# Patient Record
Sex: Male | Born: 1955
Health system: Southern US, Community
[De-identification: ages and names within clinical notes are randomized; demographics above are authoritative.]

## PROBLEM LIST (undated history)

## (undated) DIAGNOSIS — J45909 Unspecified asthma, uncomplicated: Secondary | ICD-10-CM

## (undated) DIAGNOSIS — F32A Depression, unspecified: Secondary | ICD-10-CM

## (undated) DIAGNOSIS — I10 Essential (primary) hypertension: Secondary | ICD-10-CM

## (undated) DIAGNOSIS — R55 Syncope and collapse: Secondary | ICD-10-CM

## (undated) DIAGNOSIS — E785 Hyperlipidemia, unspecified: Secondary | ICD-10-CM

## (undated) HISTORY — DX: Syncope and collapse: R55

## (undated) HISTORY — DX: Unspecified asthma, uncomplicated: J45.909

## (undated) HISTORY — PX: OTHER SURGICAL HISTORY: SHX169

## (undated) HISTORY — DX: Hyperlipidemia, unspecified: E78.5

## (undated) HISTORY — PX: APPENDECTOMY: SHX54

## (undated) HISTORY — PX: NASAL SINUS SURGERY: SHX719

## (undated) HISTORY — DX: Essential (primary) hypertension: I10

## (undated) HISTORY — PX: VASECTOMY: SHX75

## (undated) HISTORY — DX: Depression, unspecified: F32.A

---

## 1999-08-10 ENCOUNTER — Emergency Department (HOSPITAL_COMMUNITY): Admission: EM | Admit: 1999-08-10 | Discharge: 1999-08-10 | Payer: Self-pay | Admitting: Emergency Medicine

## 2005-11-27 ENCOUNTER — Ambulatory Visit (HOSPITAL_COMMUNITY): Admission: RE | Admit: 2005-11-27 | Discharge: 2005-11-27 | Payer: Self-pay | Admitting: Family Medicine

## 2005-11-27 IMAGING — CR DG CHEST 2V
2 series · 2 of 2 positions shown · non-contrast
Comparison: None.

CLINICAL DATA: Nonsmoker with persistent cough for 9 months.  
 CHEST - 2 VIEW:

[view not recorded (1 of 2)]
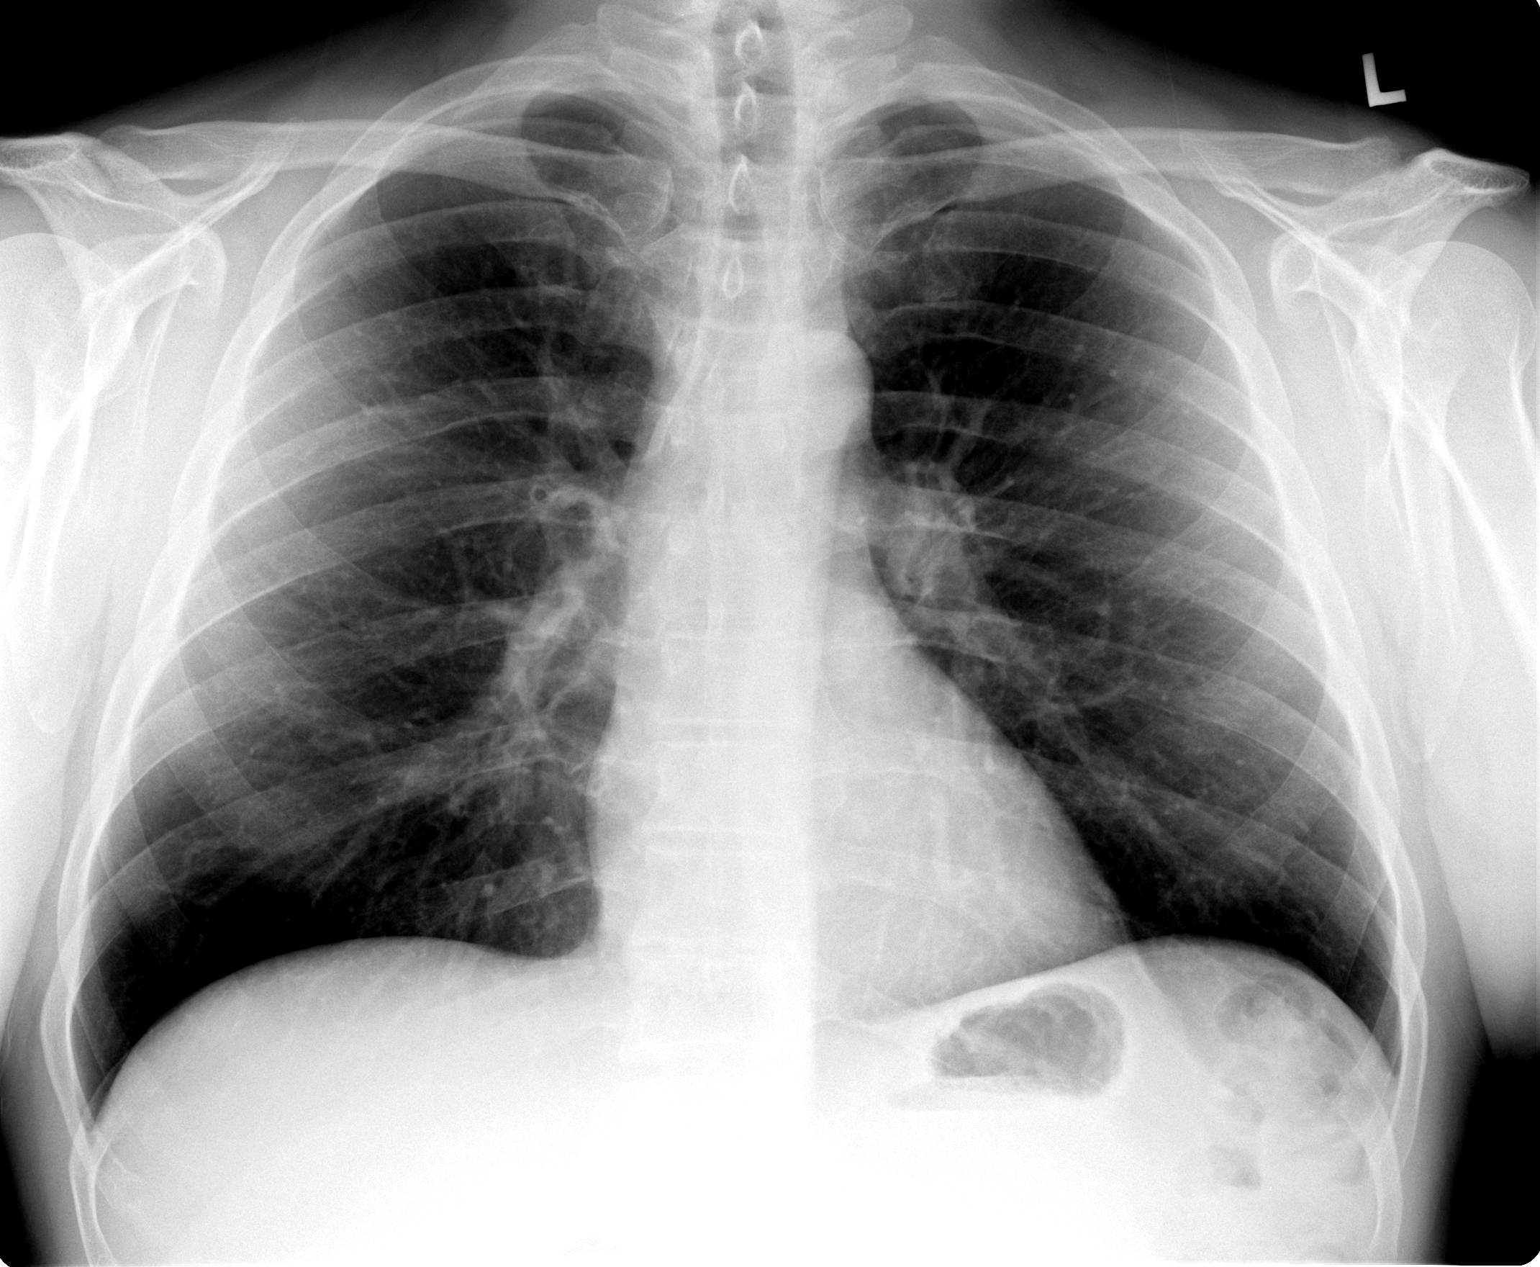

[view not recorded (2 of 2)]
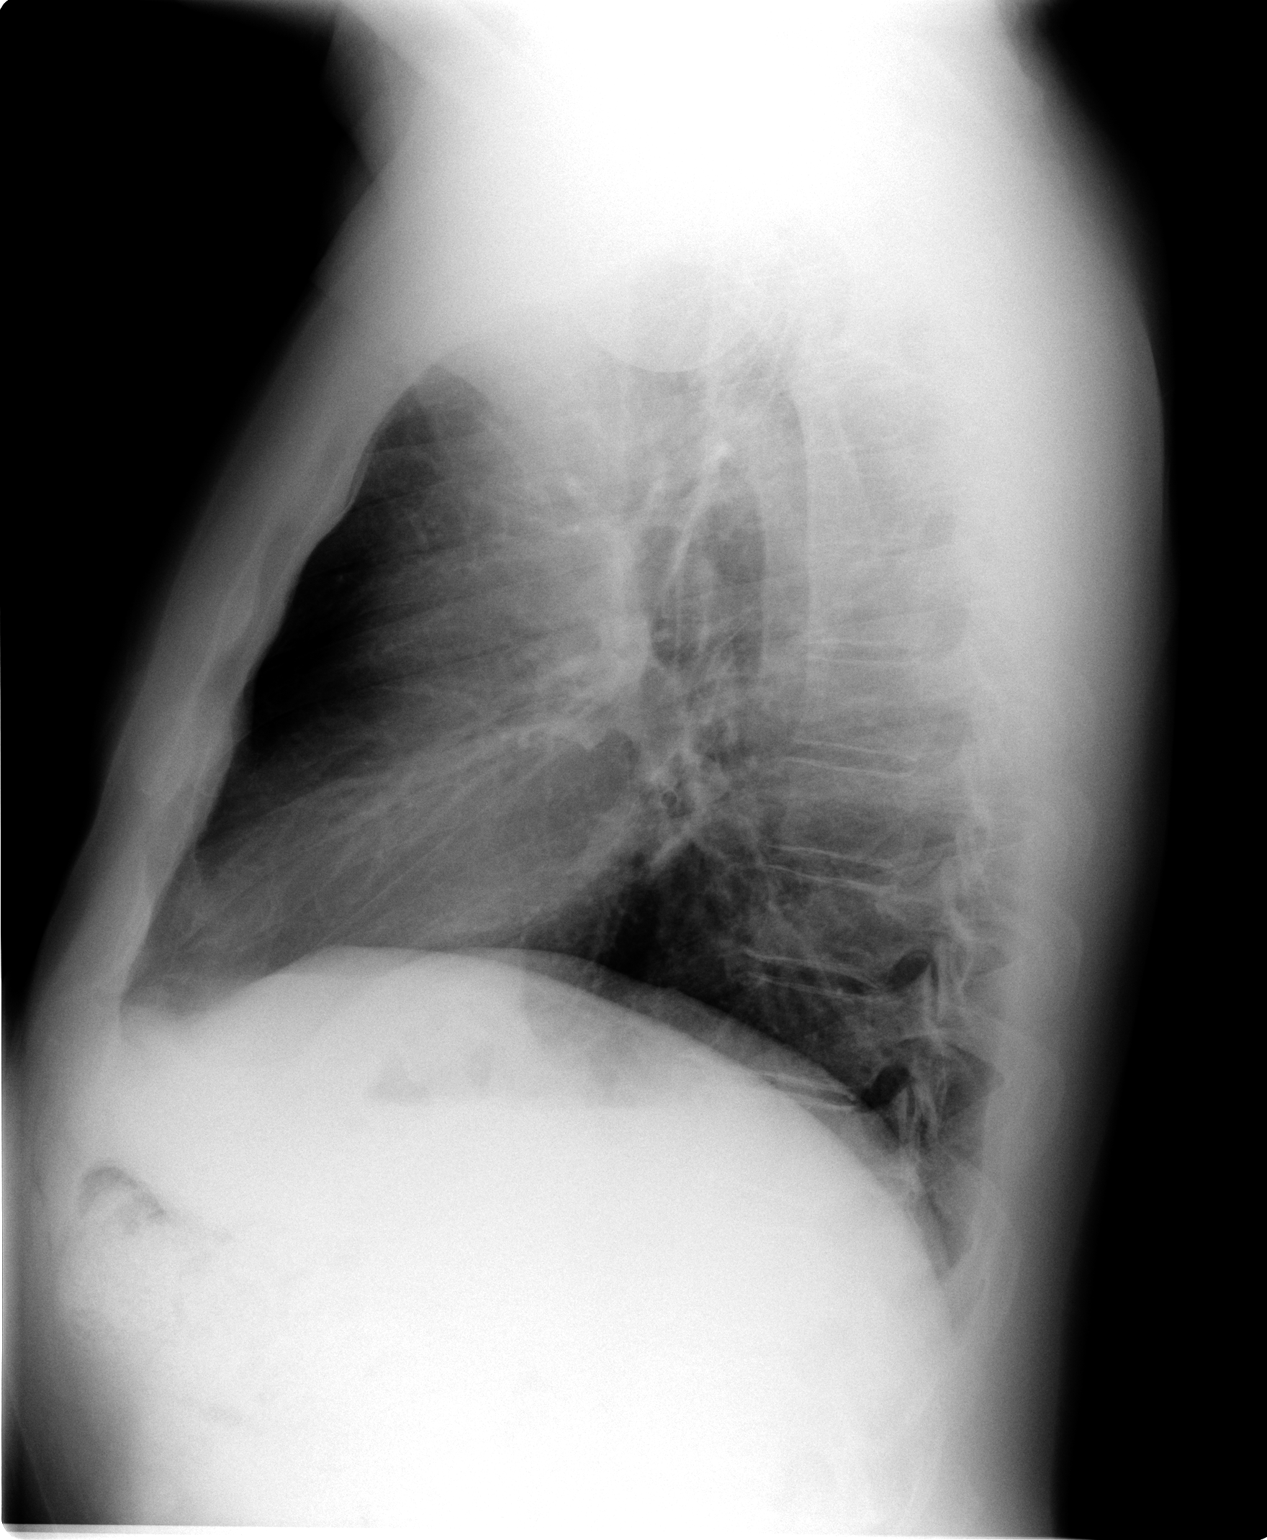

[2 of 2 positions shown; findings below may reference images not displayed]

FINDINGS: The heart size and mediastinal contours are within normal limits.  Both lungs are clear.  The visualized skeletal structures are unremarkable.
IMPRESSION: No active cardiopulmonary disease.

## 2005-11-27 IMAGING — CT CT PARANASAL SINUSES LIMITED
1 series · 15 of 28 positions shown, 19 images · non-contrast
Comparison: none

CLINICAL DATA: Nonsmoker with chronic cough for one year.  Sinus congestion and puffiness of the eyes. 
LIMITED CT OF PARANASAL SINUSES:
TECHNIQUE: Limited coronal CT images were obtained through the paranasal sinuses without intravenous contrast.

[Series 2: limited sinus prone · axial · 0.33mm/px · z∈[-9,+126]mm · 15 of 28 slices shown, 19 images]
[im 2/28  brain]
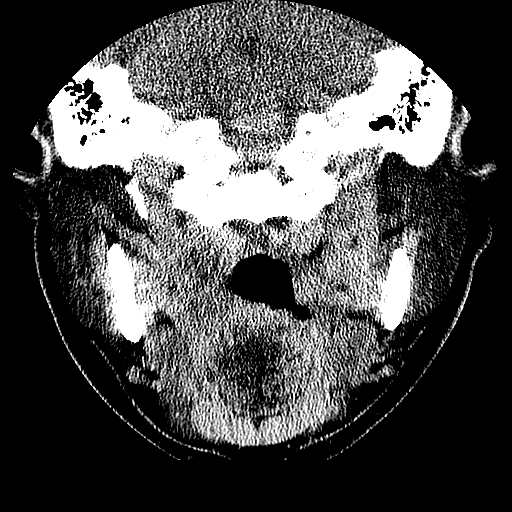
[im 2/28  bone]
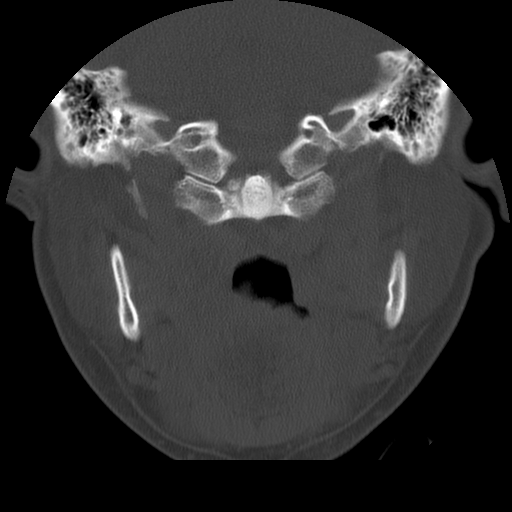
[im 4/28  bone]
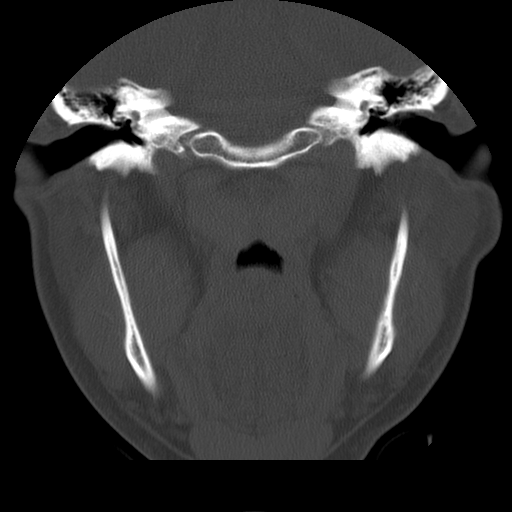
[im 6/28  bone]
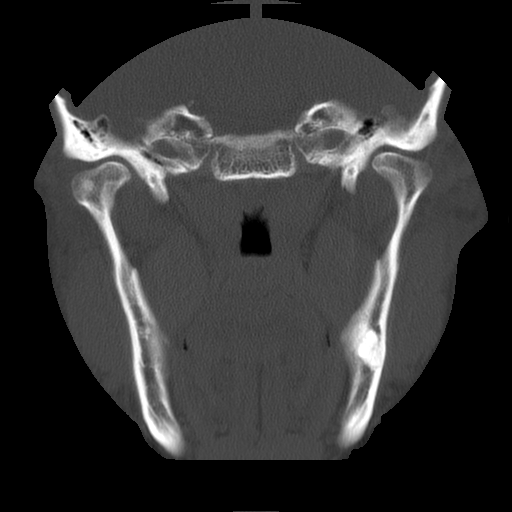
[im 8/28  bone]
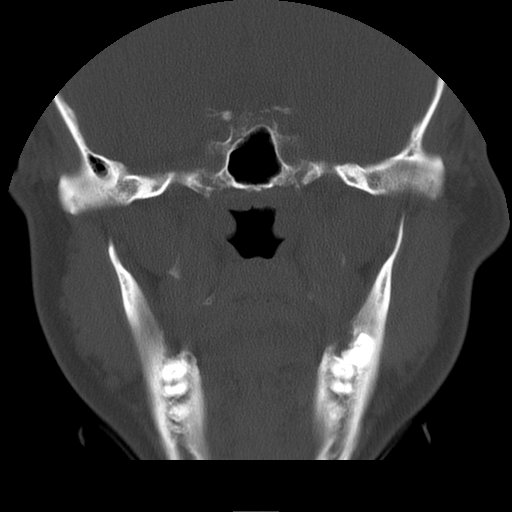
[im 9/28  brain]
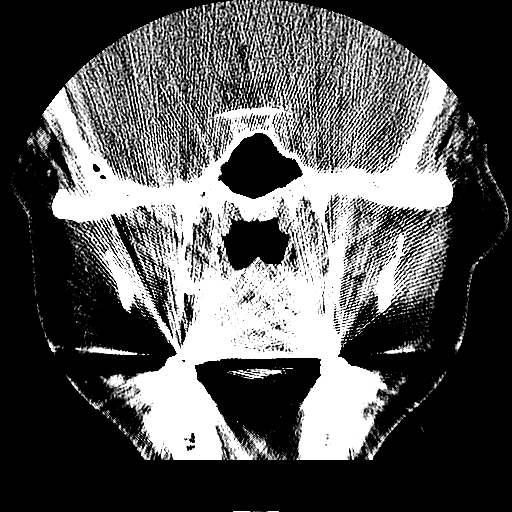
[im 9/28  bone]
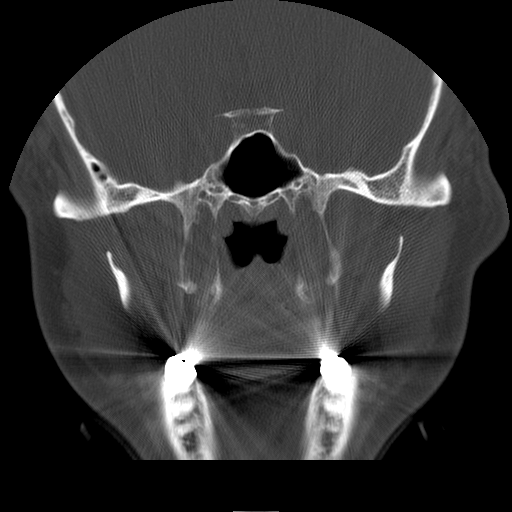
[im 11/28  bone]
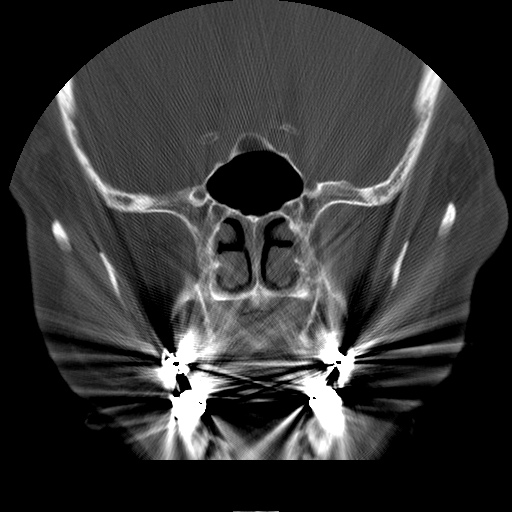
[im 13/28  bone]
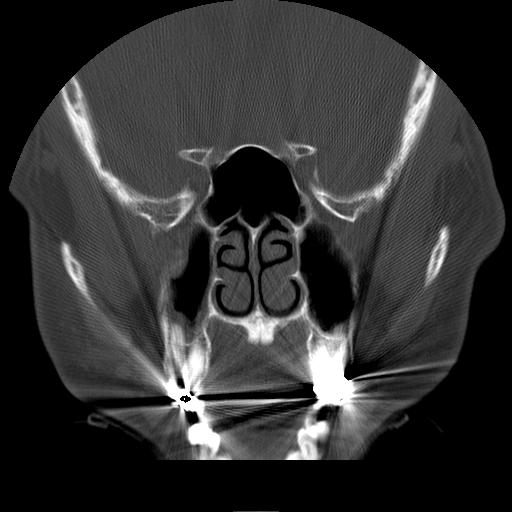
[im 15/28  bone]
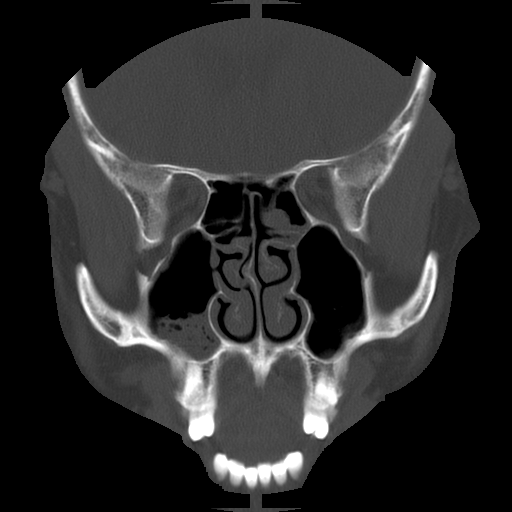
[im 16/28  brain]
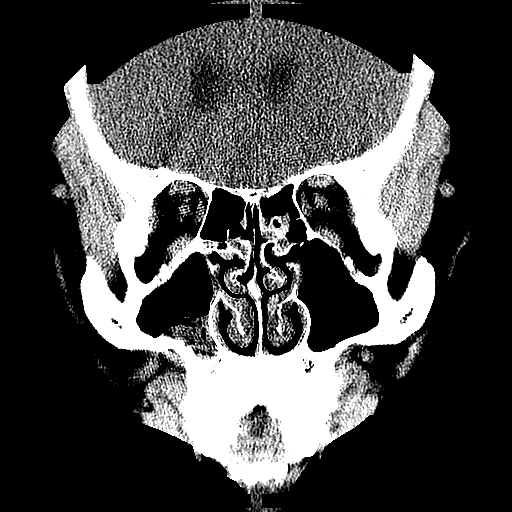
[im 16/28  bone]
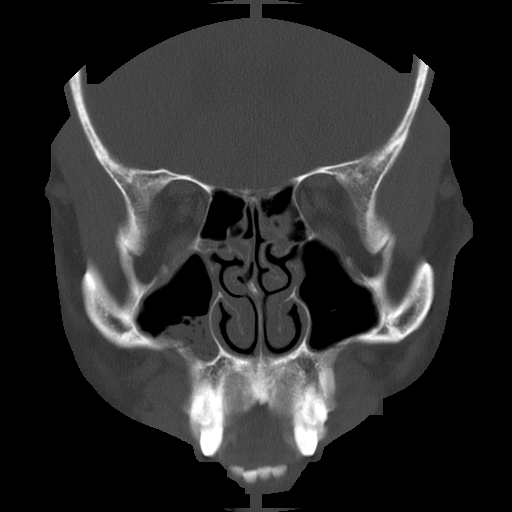
[im 18/28  bone]
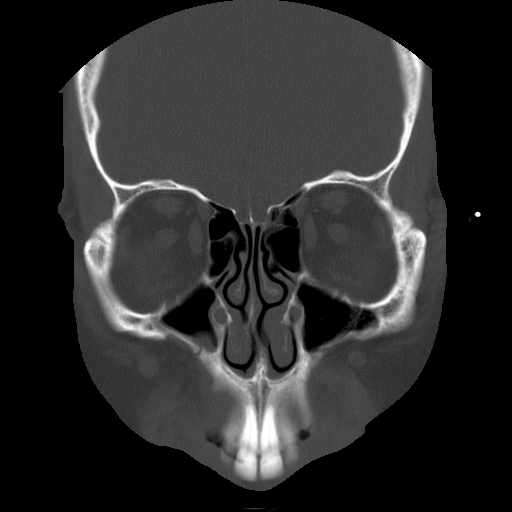
[im 20/28  bone]
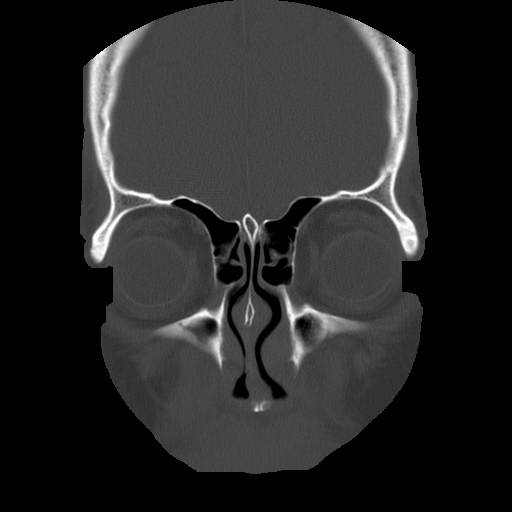
[im 21/28  bone]
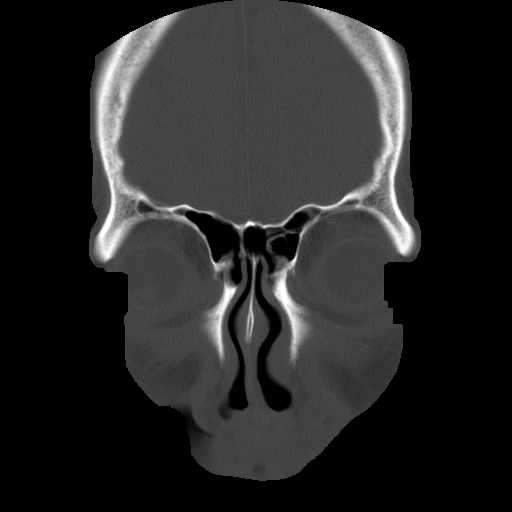
[im 23/28  brain]
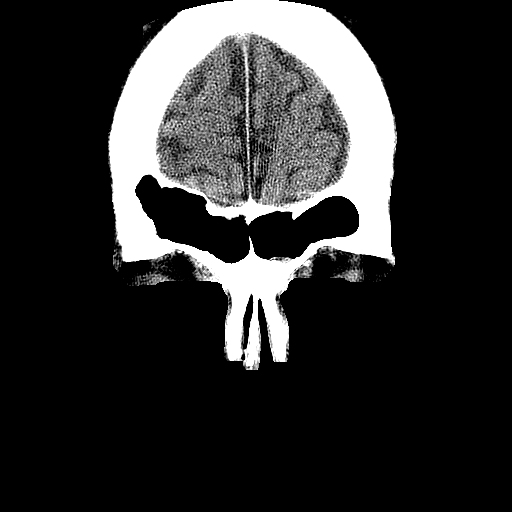
[im 23/28  bone]
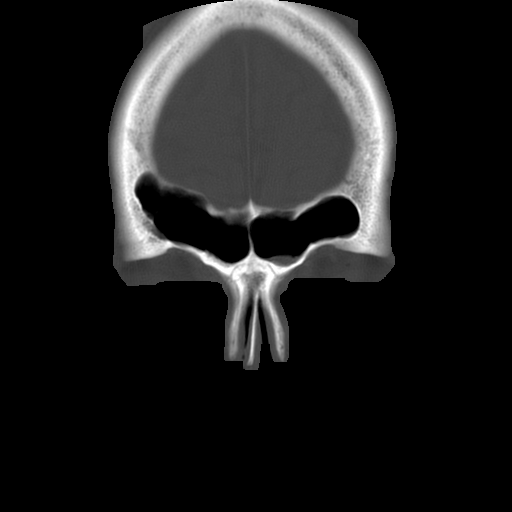
[im 25/28  bone]
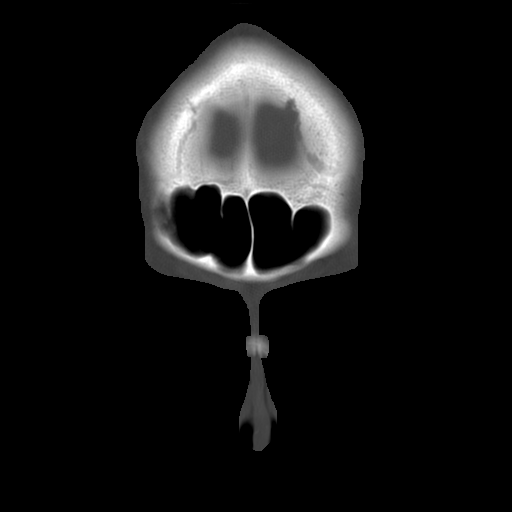
[im 27/28  bone]
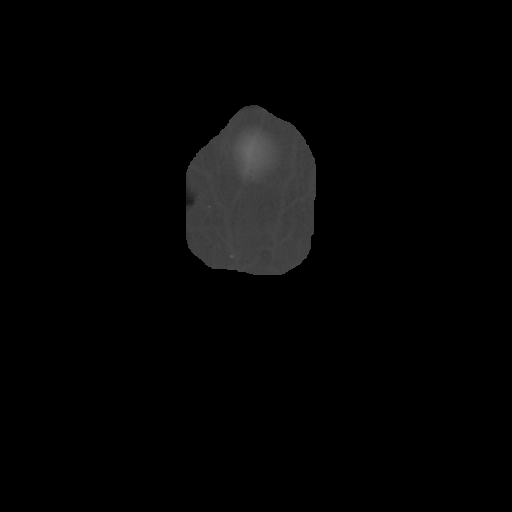

[15 of 28 positions shown; findings below may reference images not displayed]

FINDINGS: There is mild focal mucosal thickening identified in the inferior portion of the right frontal sinus.  Mucosal thickening in the base of the right maxillary sinus is also noted.  Some soft tissue is seen in the left maxillary sinus with trapped air bubbles, suggesting that this may represent thick mucous.  Also noted within the right maxillary sinus is some MATSHELO appearing soft tissue.  This does not completely fill the maxillary sinus, and there is no evidence for associated bony destruction or extra-sinus extension.  This may represent some resolving sinus infection, although the possibility of an atypical infectious process is not excluded.  Clinical correlation would be useful.  
Several of the ethmoidal air cells demonstrate fluid filling and some mucosal thickening.  Both ostial meatal complexes appear patent.  The nasal septum shows minimal left-sided deviation.  No Haller cell formation is seen.
IMPRESSION: 1. Mucosal thickening and fluid filling in several of the paranasal sinuses, as described above.  The overall appearance is compatible with some degree of chronic sinusitis.   Thick fluid is suggested within the left maxillary sinus and some fluid filling of ethmoidal air cells raises the possibility of an acute process. 
2. MATSHELO soft tissue is seen within the right maxillary sinus.  This is an unusual finding and may represent some resolving sinus disease on this side. However, the possibility of an atypical infection in this location is not excluded.  There is no associated bony destruction seen to suggest radiographic evidence for a fungal infection causing this appearance.  Clinical correlation would be recommended.

## 2007-11-11 ENCOUNTER — Ambulatory Visit (HOSPITAL_COMMUNITY): Admission: RE | Admit: 2007-11-11 | Discharge: 2007-11-11 | Payer: Self-pay | Admitting: Family Medicine

## 2007-11-11 IMAGING — CT CT PARANASAL SINUSES LIMITED
1 series · 14 of 16 positions shown, 18 images · non-contrast
Comparison: [DATE]

CLINICAL DATA: Recurrent sinus infections with drainage and
congestion.

CT PARANASAL SINUSES WITHOUT CONTRAST
TECHNIQUE: Multidetector CT through the paranasal sinuses was
performed using the standard protocol without intravenous contrast.

[Series 2: limited sinus prone · axial · 0.33mm/px · z∈[+146,+248]mm · 14 of 16 slices shown, 18 images]
[im 2/16  brain]
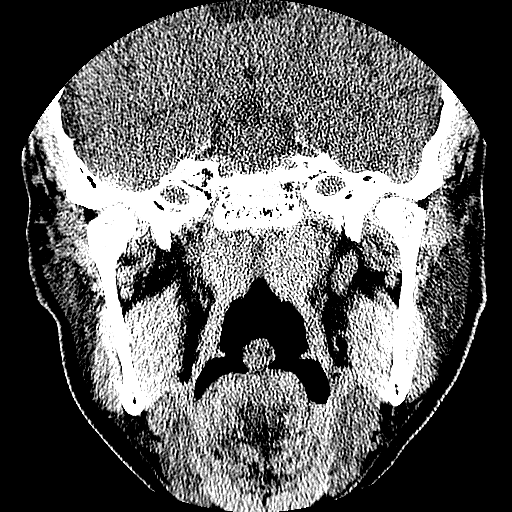
[im 2/16  bone]
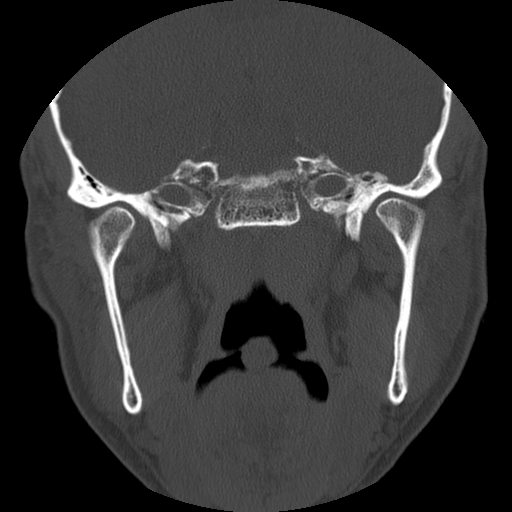
[im 3/16  bone]
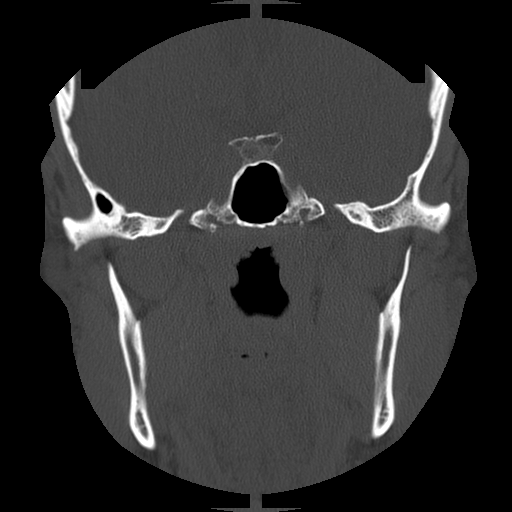
[im 4/16  bone]
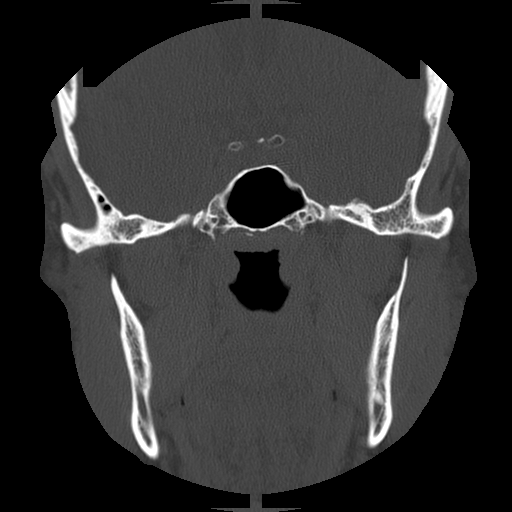
[im 5/16  bone]
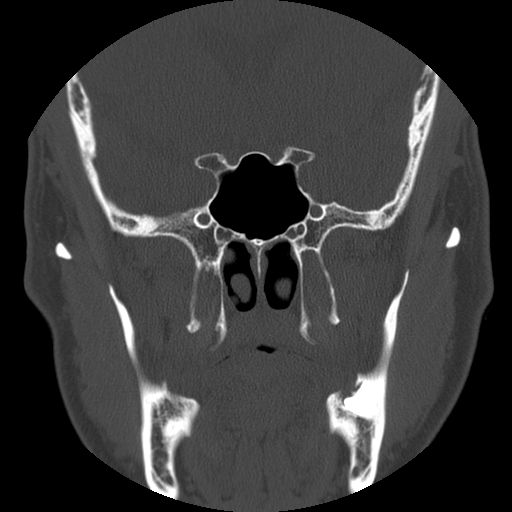
[im 6/16  brain]
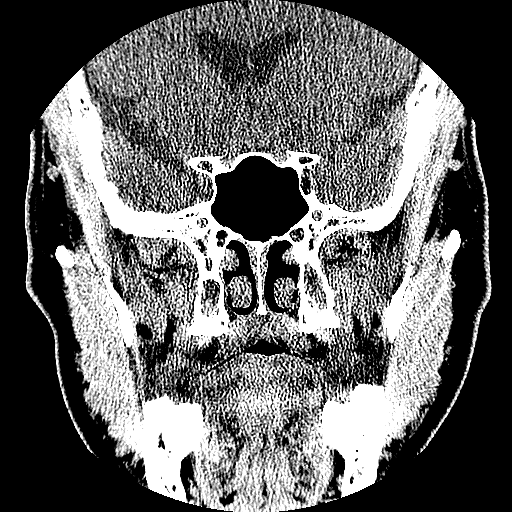
[im 6/16  bone]
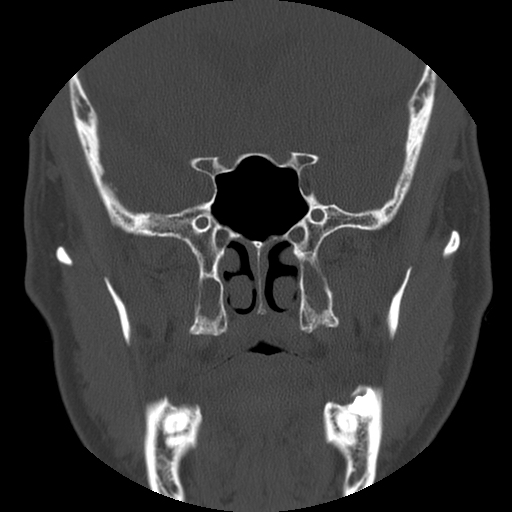
[im 7/16  bone]
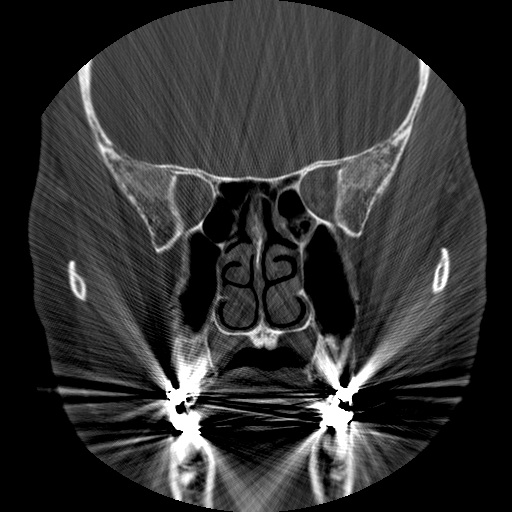
[im 8/16  bone]
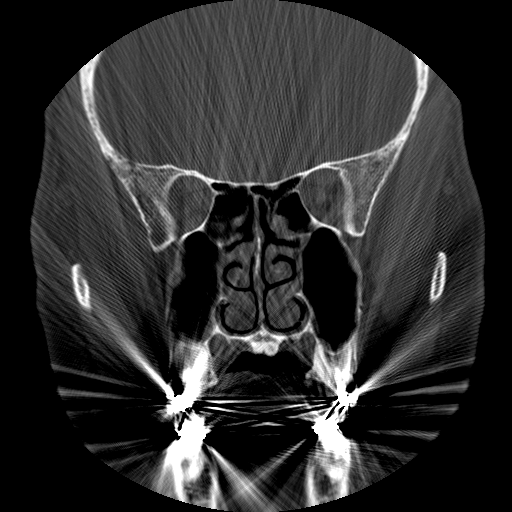
[im 9/16  bone]
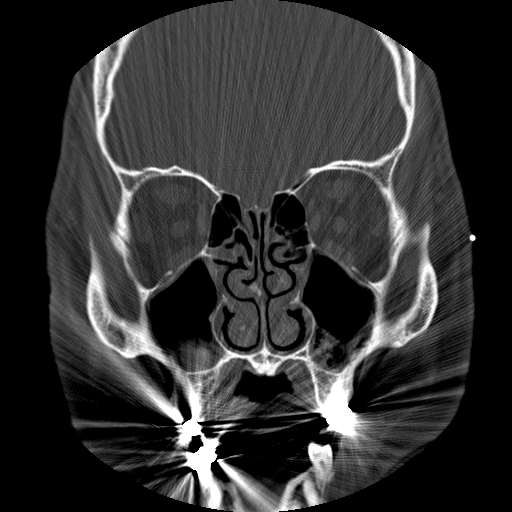
[im 10/16  brain]
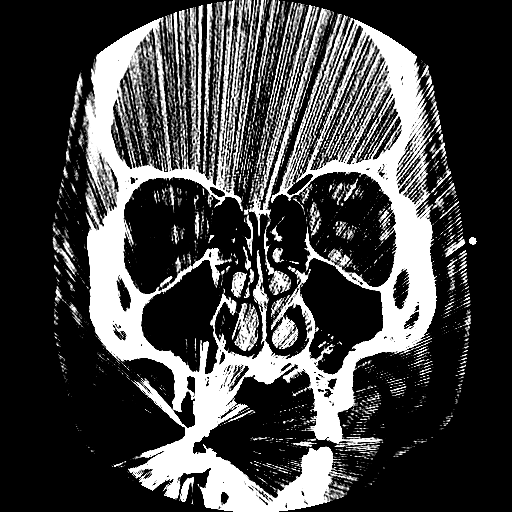
[im 10/16  bone]
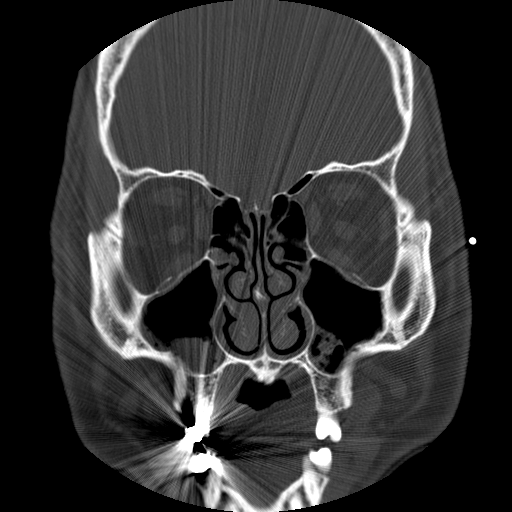
[im 11/16  bone]
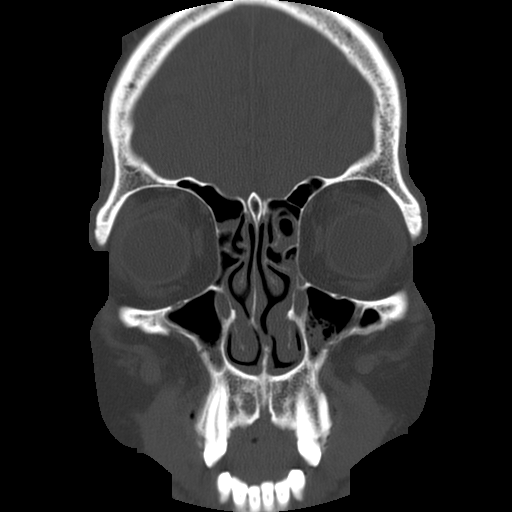
[im 12/16  bone]
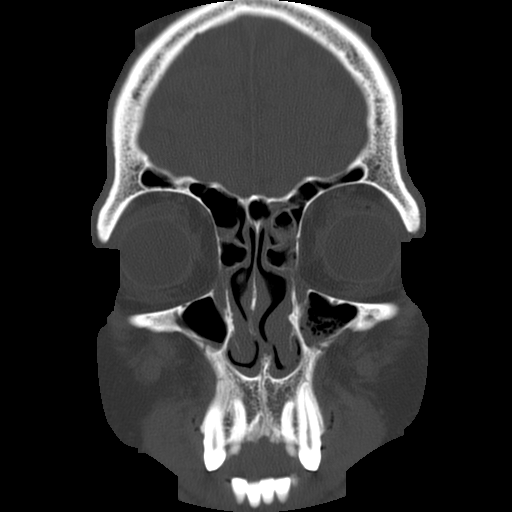
[im 13/16  bone]
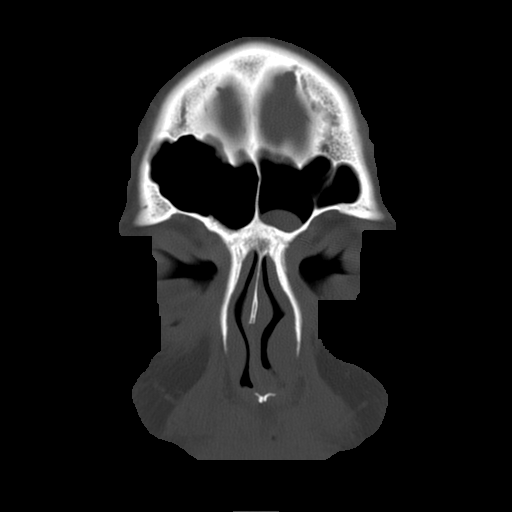
[im 14/16  brain]
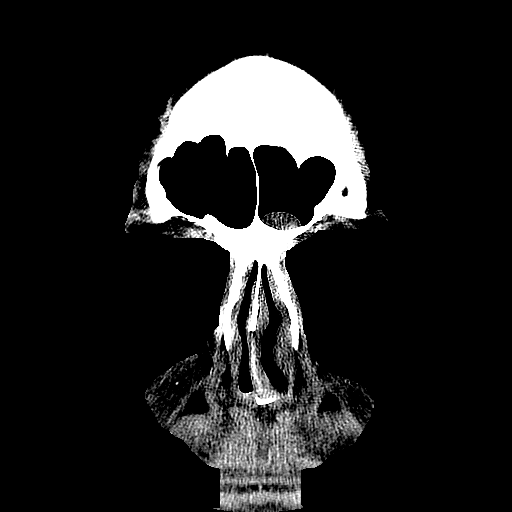
[im 14/16  bone]
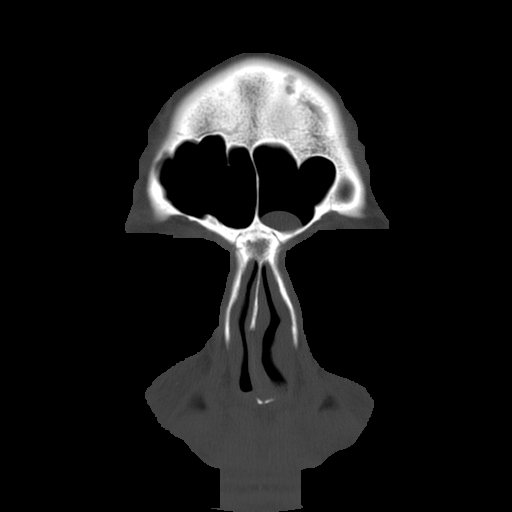
[im 15/16  bone]
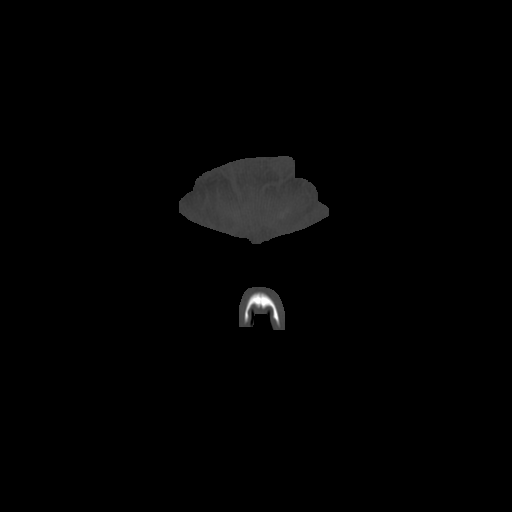

[14 of 16 positions shown; findings below may reference images not displayed]

FINDINGS: There is mucosal thickening in the maxillary, ethmoid and
frontal sinuses bilaterally.  There are bubbly secretions in the
maxillary sinuses bilaterally.  This likely represents   acute and
chronic sinusitis.  Nasal septum is mildly deviated to the left.
There is no bony destruction and there is no mass lesion.
IMPRESSION: Acute and chronic sinusitis bilaterally, similar to the prior
study.

## 2011-01-16 ENCOUNTER — Emergency Department (HOSPITAL_COMMUNITY)
Admission: EM | Admit: 2011-01-16 | Discharge: 2011-01-16 | Disposition: A | Discharge status: Home / Self Care | Payer: 59 | Source: Home / Self Care

## 2012-10-10 ENCOUNTER — Other Ambulatory Visit (HOSPITAL_COMMUNITY): Payer: Self-pay | Admitting: Family Medicine

## 2012-10-10 ENCOUNTER — Encounter: Payer: Self-pay | Admitting: Cardiology

## 2012-10-10 DIAGNOSIS — R55 Syncope and collapse: Secondary | ICD-10-CM

## 2012-10-11 ENCOUNTER — Ambulatory Visit (HOSPITAL_COMMUNITY)
Admission: RE | Admit: 2012-10-11 | Discharge: 2012-10-11 | Disposition: A | Discharge status: Home / Self Care | Payer: 59 | Source: Ambulatory Visit | Attending: Family Medicine | Admitting: Family Medicine

## 2012-10-11 DIAGNOSIS — J329 Chronic sinusitis, unspecified: Secondary | ICD-10-CM | POA: Insufficient documentation

## 2012-10-11 DIAGNOSIS — R55 Syncope and collapse: Secondary | ICD-10-CM | POA: Insufficient documentation

## 2012-10-11 IMAGING — CT CT HEAD W/O CM
1 series · 16 of 30 positions shown, 20 images · non-contrast
Comparison: None

CLINICAL DATA: Syncope

CT HEAD WITHOUT CONTRAST
TECHNIQUE: Contiguous axial images were obtained from the base of
the skull through the vertex without contrast.

[Series 2: head 5.0 h30s · axial · 0.46mm/px · z∈[+1406,+1546]mm · 16 of 32 slices shown, 20 images]
[im 2/32  brain]
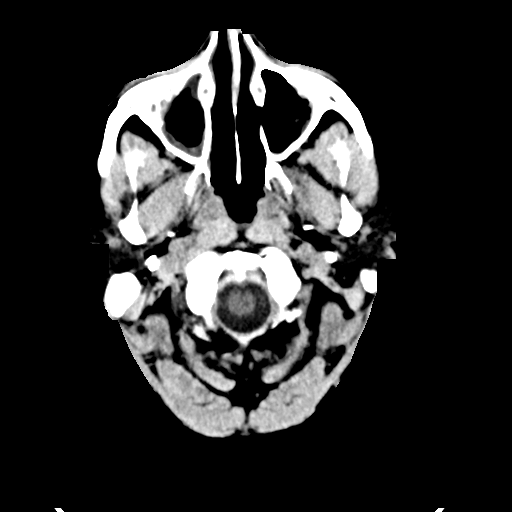
[im 2/32  bone]
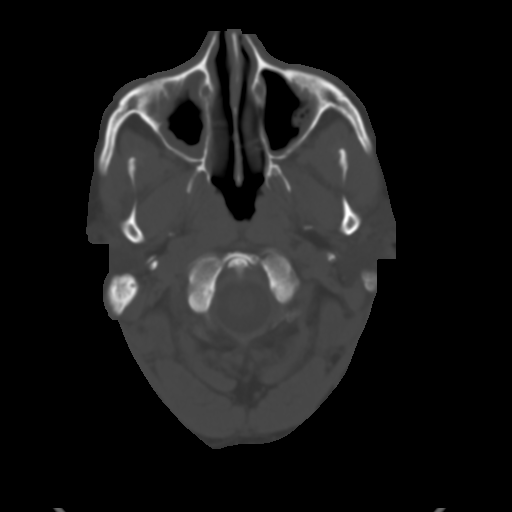
[im 4/32  brain]
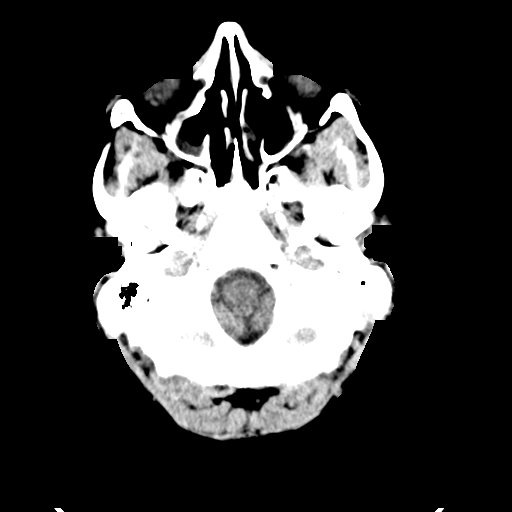
[im 6/32  brain]
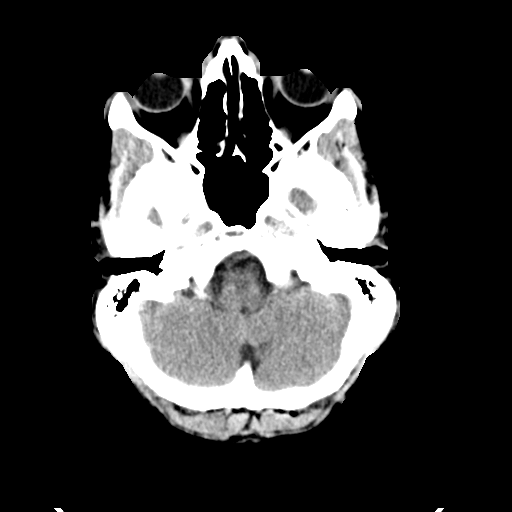
[im 8/32  brain]
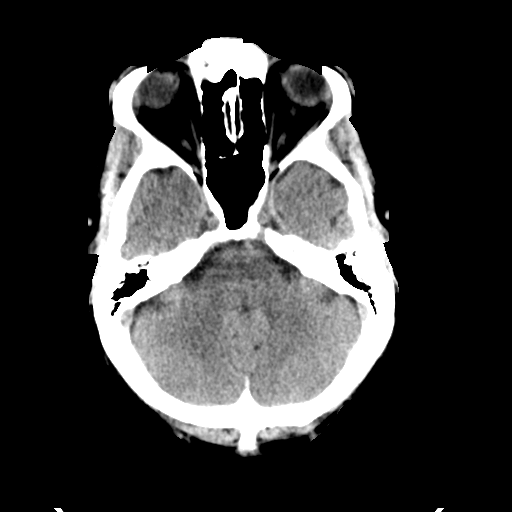
[im 9/32  brain]
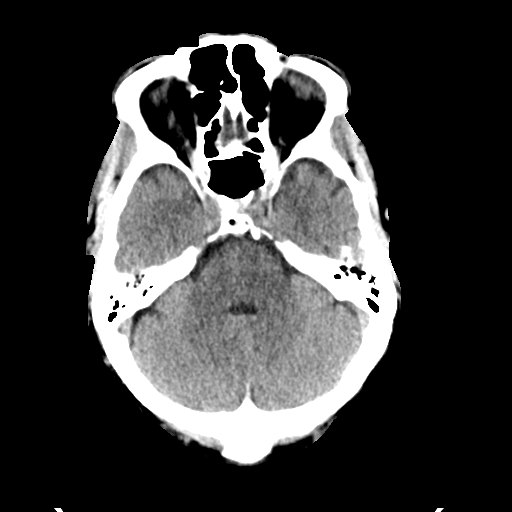
[im 9/32  bone]
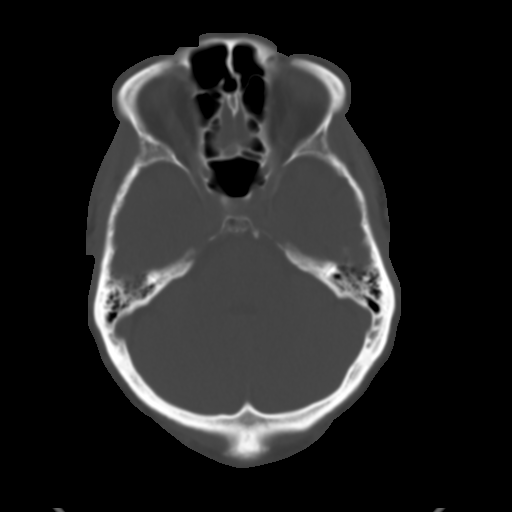
[im 11/32  brain]
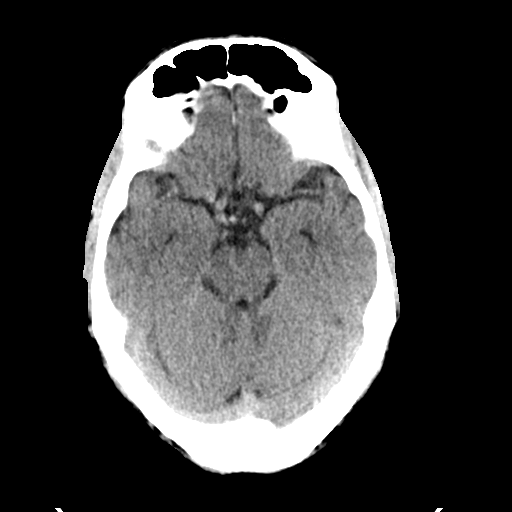
[im 13/32  brain]
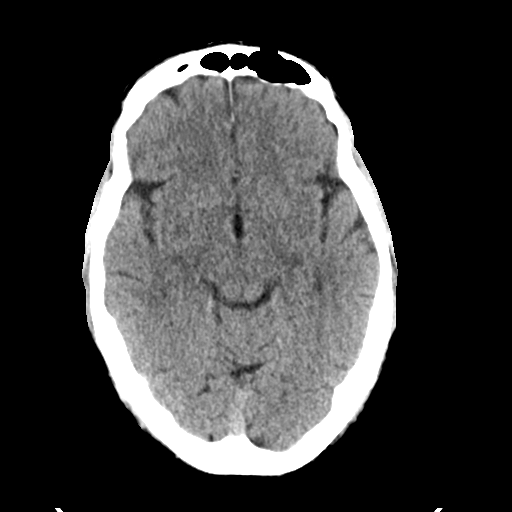
[im 15/32  brain]
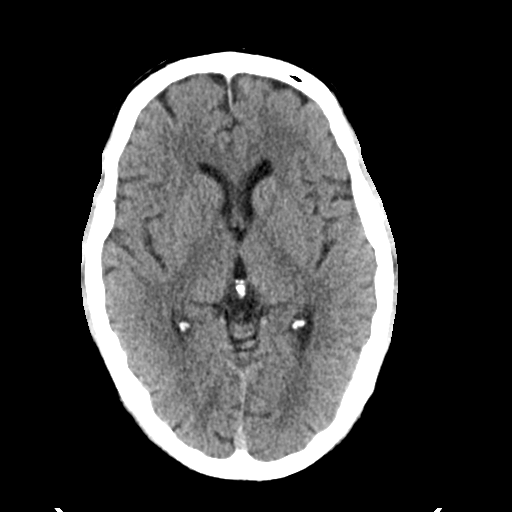
[im 17/32  brain]
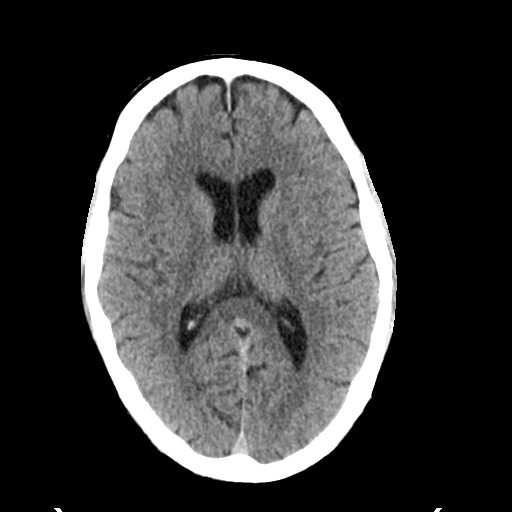
[im 17/32  bone]
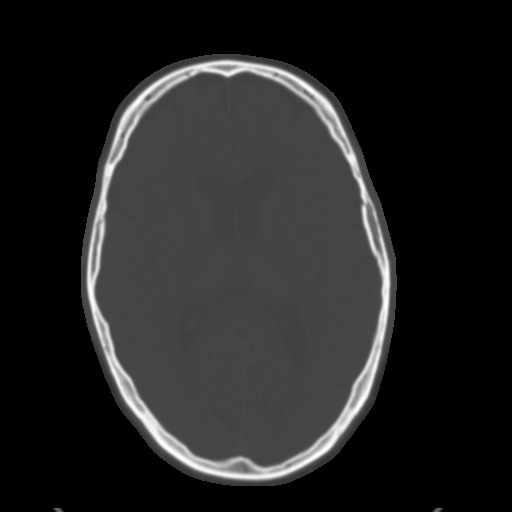
[im 19/32  brain]
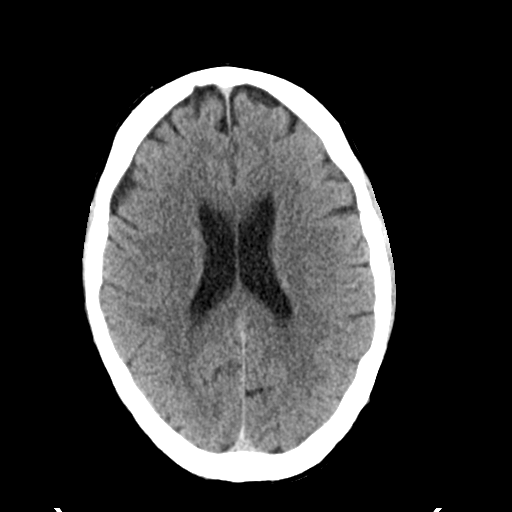
[im 21/32  brain]
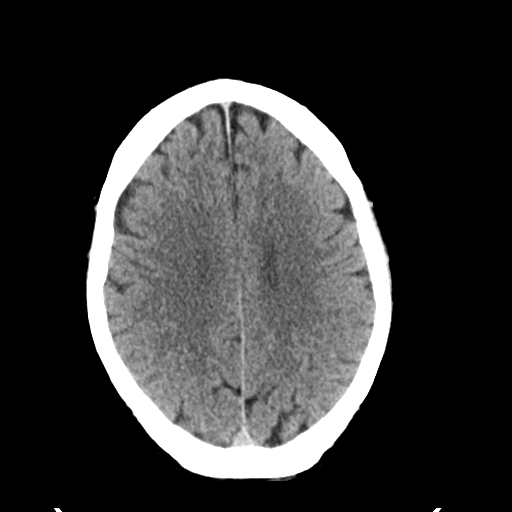
[im 23/32  brain]
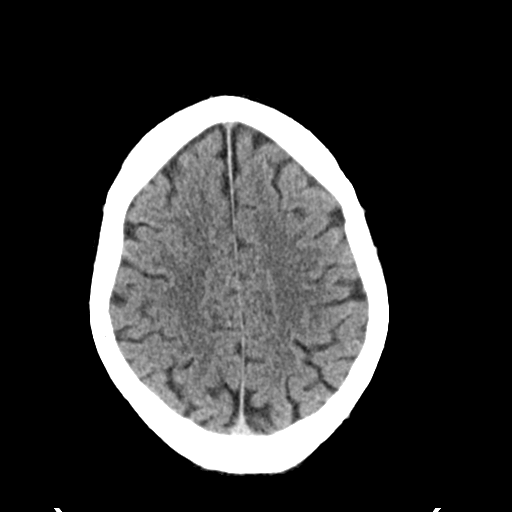
[im 24/32  brain]
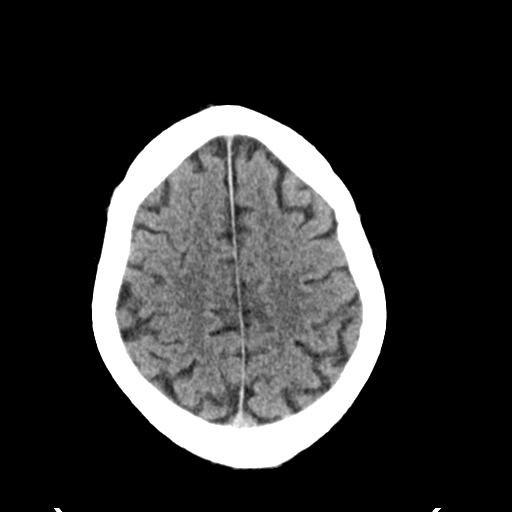
[im 24/32  bone]
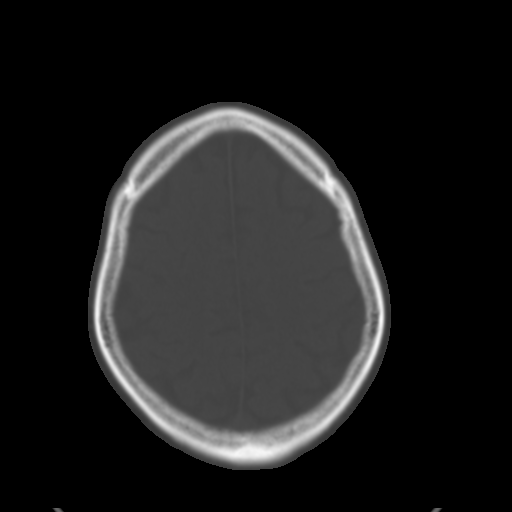
[im 26/32  brain]
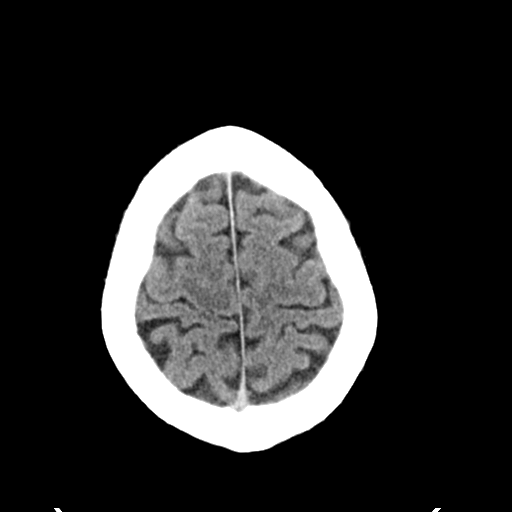
[im 28/32  brain]
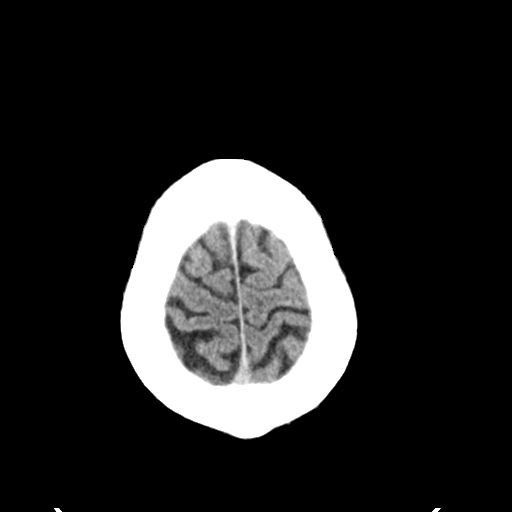
[im 30/32  brain]
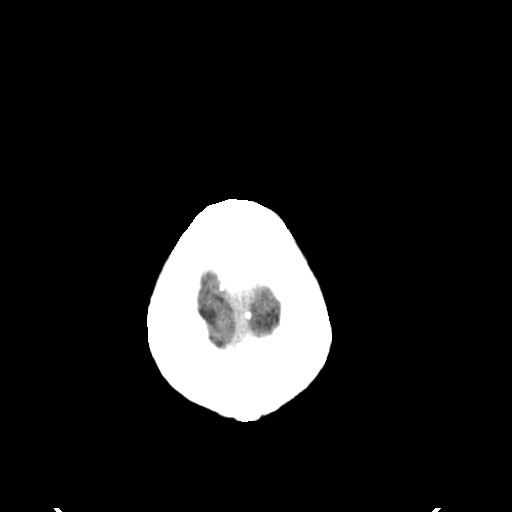

[16 of 30 positions shown; findings below may reference images not displayed]

FINDINGS: Ventricle size is normal.  Negative for acute infarct.
Negative for hemorrhage mass or edema.

Prior sinus surgery with medial antrectomy and bilateral
ethmoidectomy.  There is mucosal edema in the paranasal sinuses.
Negative for air-fluid level.
IMPRESSION: No significant intracranial abnormality.

Chronic sinusitis.

## 2012-12-12 ENCOUNTER — Ambulatory Visit (INDEPENDENT_AMBULATORY_CARE_PROVIDER_SITE_OTHER): Payer: 59 | Admitting: Cardiology

## 2012-12-12 ENCOUNTER — Encounter: Payer: Self-pay | Admitting: Cardiology

## 2012-12-12 VITALS — BP 138/76 | HR 94 | Ht 68.5 in | Wt 178.0 lb

## 2012-12-12 DIAGNOSIS — I10 Essential (primary) hypertension: Secondary | ICD-10-CM | POA: Insufficient documentation

## 2012-12-12 DIAGNOSIS — R55 Syncope and collapse: Secondary | ICD-10-CM | POA: Insufficient documentation

## 2012-12-12 NOTE — Progress Notes (Signed)
INDIANA PECHACEK Date of Birth: [birth year] Medical Record #[id number]  History of Present Illness: Mr Haggart is seen at the request of Dr. Valentina Lucks for evaluation of syncope. He is a pleasant 57 year old white male who in general has been in excellent health. He does have a history of hypertension and has been on chronic therapy with losartan HCT. He states that over the past 5 or 6 months he has had occasional dizziness or lightheadedness. 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 When he got home he had cleaned up and was standing in the kitchen where he suddenly felt lightheaded and then passed out. This was witnessed by his wife. He came to fairly quickly but then tried to get up and walk outside and he passed out again. He denied any palpitations. There was no seizure activity. He had no headache. He denied any chest pain or shortness of breath. Patient states that he has had a history of fainting symptoms early age. He states he comes from a family of fainters. He does try to stay well hydrated. Since his syncopal episode 6 weeks ago he is really having no significant recurrence. Initially he held his blood pressure medication but it was noted that his blood pressure was increasing so he went back on it. When seen by Dr. Valentina Lucks he was noted to be mildly orthostatic.  No current outpatient prescriptions on file prior to visit.   No current facility-administered medications on file prior to visit.    Allergies  Allergen Reactions  . Other     HORSE SERUM TETANUS ANTI TOX : PATIENT GETS HIGH FEVER    Past Medical History  Diagnosis Date  . HTN (hypertension)   . Syncope     Past Surgical History  Procedure Laterality Date  . Nasal sinus surgery    . Appendectomy    . Vasectomy      History  Smoking status  . Never Smoker   Smokeless tobacco  . Not on file    History  Alcohol Use  . Yes    Comment: ONCE A DAY     Family History  Problem Relation Age of Onset    . Heart attack Father   . Heart attack Brother     Review of Systems: As noted in history of present illness.  All other systems were reviewed and are negative.  Physical Exam: BP 138/76  Pulse 94  Ht 5' 8.5" (1.74 m)  Wt 178 lb (80.74 kg)  BMI 26.67 kg/m2  SpO2 97% Extended orthostatics today reveal no orthostatic blood pressure heart rate change. HEENT: Normal Neck is without JVD, adenopathy, thyromegaly, or bruits. Lungs: Clear Cardiovascular: Regular rate and rhythm without gallop, murmur, or click. Abdomen: Soft and nontender. No gross splenomegaly or masses. Extremities are without edema or cyanosis. Pulses are 2+. Skin: Warm and dry Neuro: Alert and oriented x3. Cranial nerves II through XII are intact. LABORATORY DATA: ECG demonstrates normal sinus rhythm with a normal ECG. Laboratory dated 10/10/2012 includes a chemistry panel, CBC, which were normal. Total cholesterol 176, triglycerides 111, HDL 50, LDL 97.  Assessment / Plan: 1. Syncope. I suspect this is primarily vasovagal but may of been exacerbated by dehydration. At this point his symptoms have resolved and he has had no recurrence. We discussed the importance of maintaining good hydration. If he has recurrent dizziness or syncope I think we would need to consider reducing his blood pressure medication particularly  getting rid of his diuretic therapy. If he does have recurrence I suggested further evaluation with an event monitor and echocardiogram. He is to call with any recurrent symptoms.

## 2012-12-12 NOTE — Patient Instructions (Addendum)
We will monitor for recurrent symptoms  Let me know if you pass out again  I will see you as needed.

## 2012-12-13 ENCOUNTER — Encounter: Payer: Self-pay | Admitting: Cardiology

## 2013-12-18 ENCOUNTER — Other Ambulatory Visit: Payer: Self-pay | Admitting: Physician Assistant

## 2013-12-18 ENCOUNTER — Ambulatory Visit
Admission: RE | Admit: 2013-12-18 | Discharge: 2013-12-18 | Disposition: A | Discharge status: Home / Self Care | Payer: 59 | Source: Ambulatory Visit | Attending: Physician Assistant | Admitting: Physician Assistant

## 2013-12-18 DIAGNOSIS — R079 Chest pain, unspecified: Secondary | ICD-10-CM

## 2013-12-18 DIAGNOSIS — M546 Pain in thoracic spine: Secondary | ICD-10-CM

## 2013-12-18 IMAGING — CR DG CHEST 2V
2 series · 2 of 2 positions shown · non-contrast
Comparison: [DATE]

CLINICAL DATA: Right chest pain

EXAM:
CHEST  2 VIEW

[w chest pa]
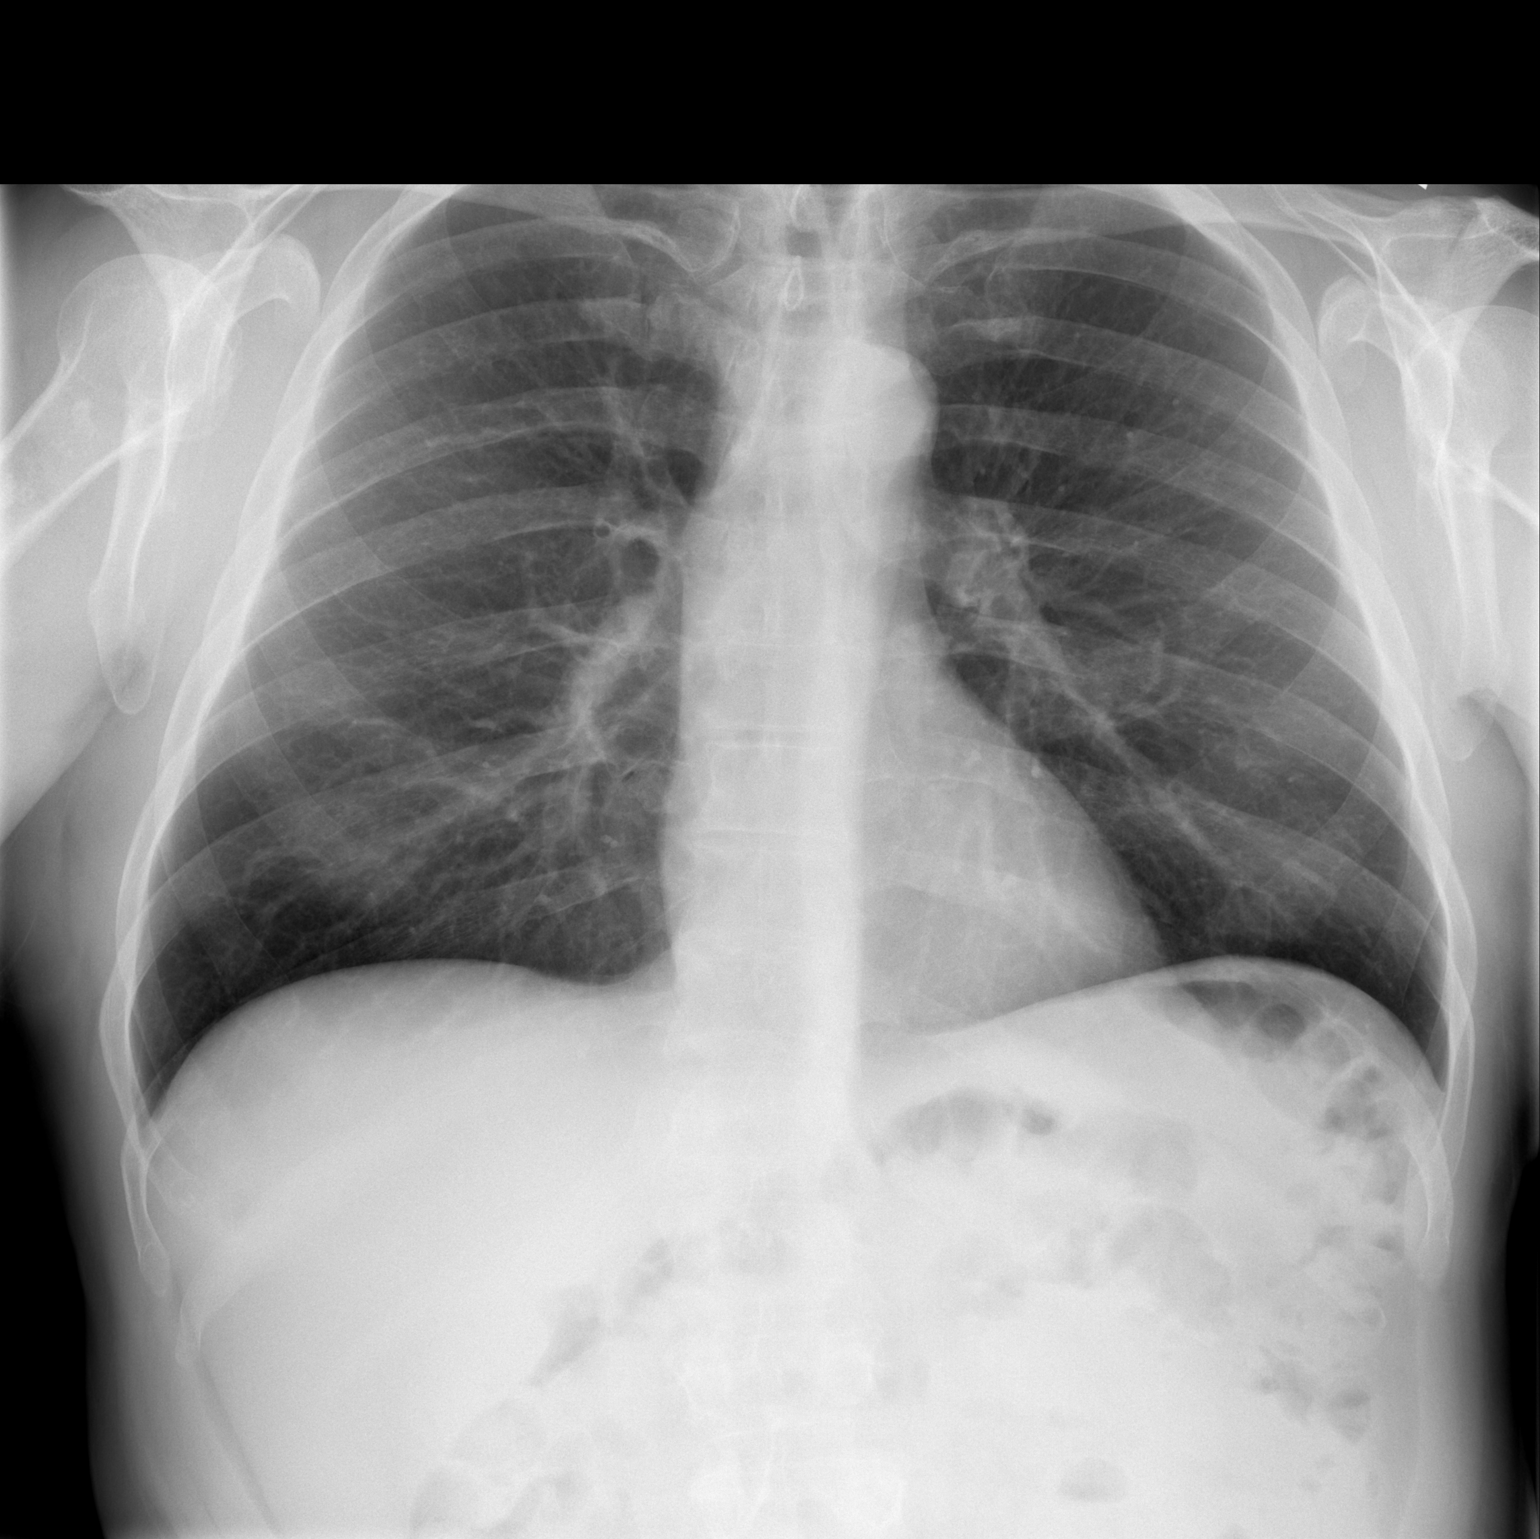

[w chest lat]
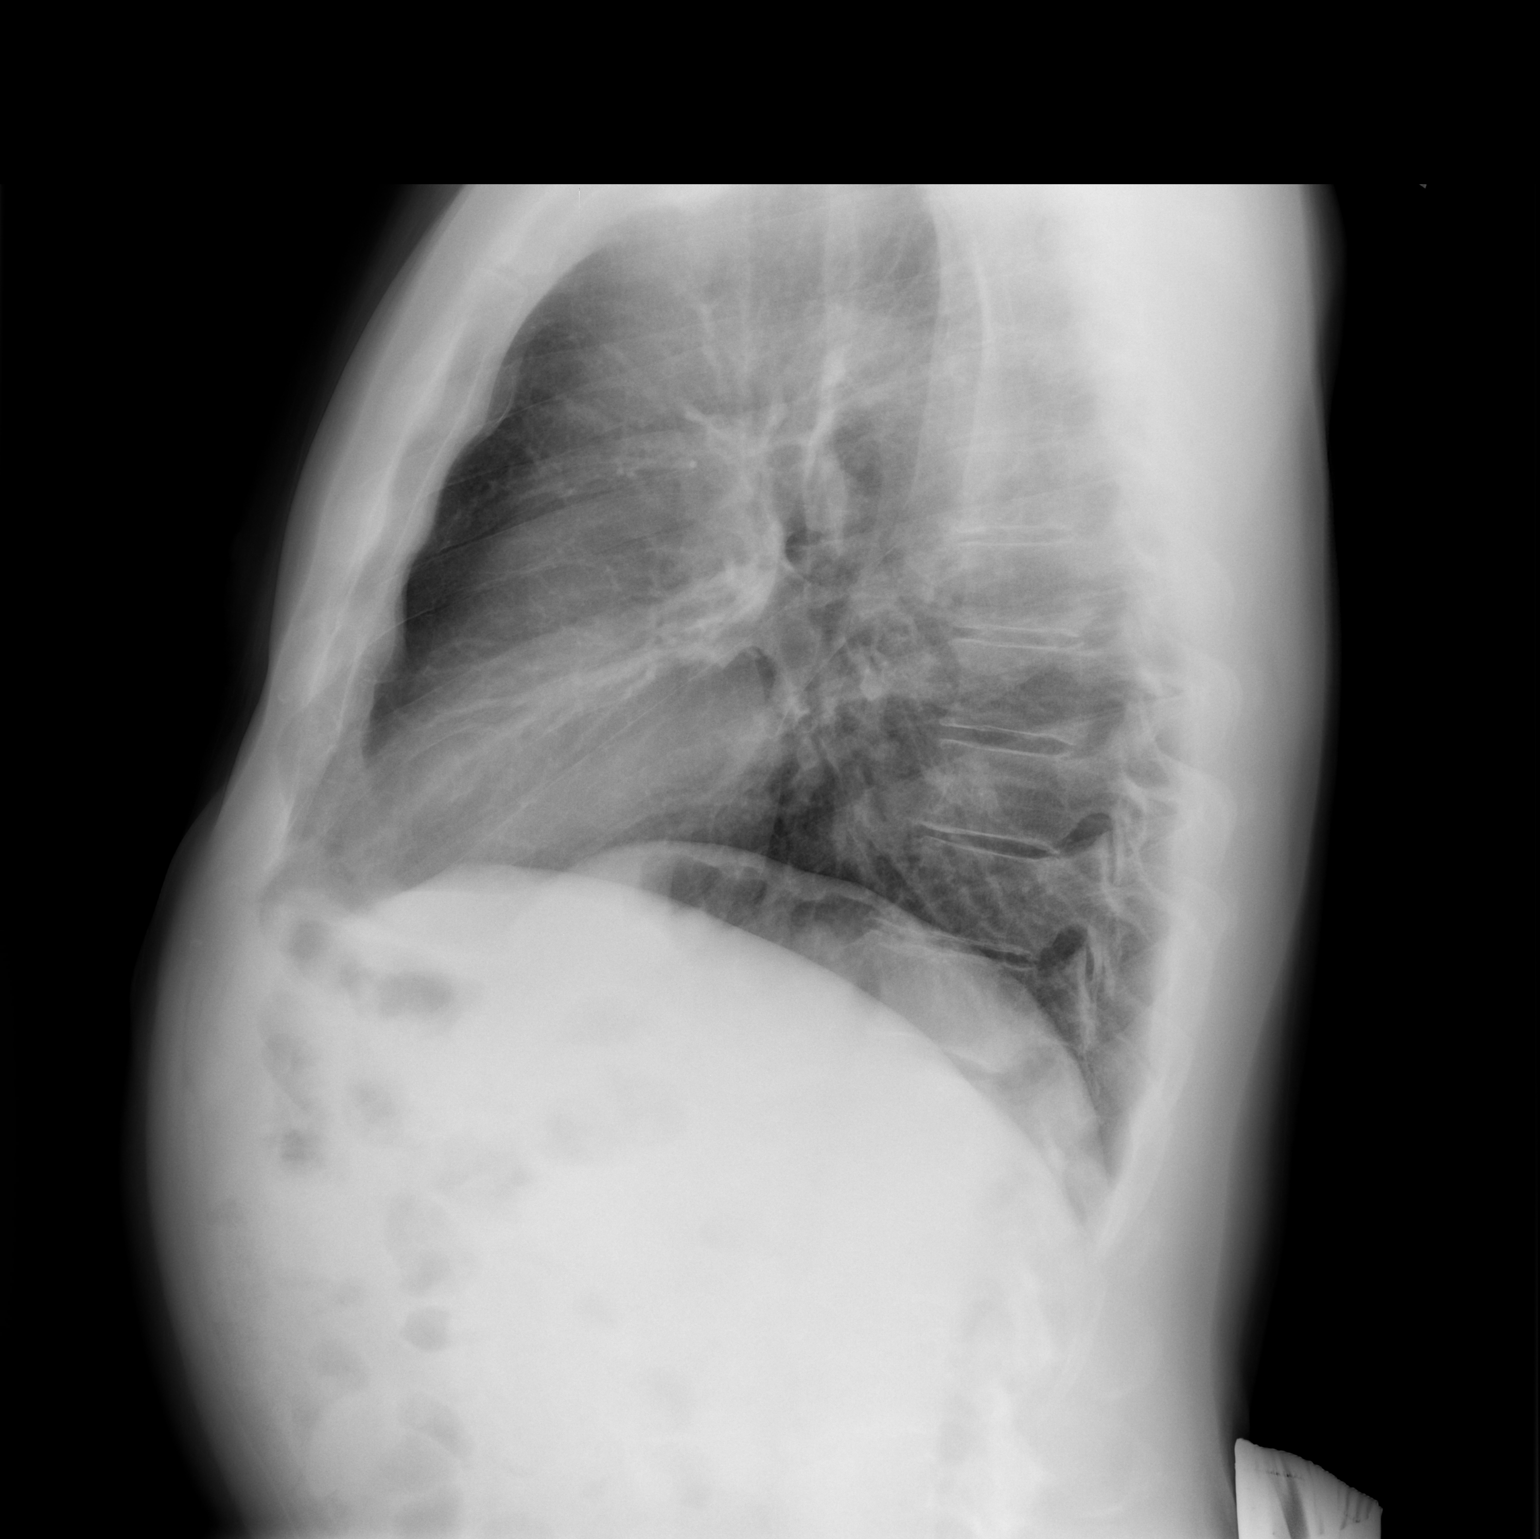

[2 of 2 positions shown; findings below may reference images not displayed]

FINDINGS: The heart size and mediastinal contours are within normal limits.
Both lungs are clear. The visualized skeletal structures are
unremarkable.
IMPRESSION: No active cardiopulmonary disease.

## 2013-12-29 ENCOUNTER — Other Ambulatory Visit (HOSPITAL_COMMUNITY): Payer: Self-pay | Admitting: Obstetrics and Gynecology

## 2013-12-29 DIAGNOSIS — R1011 Right upper quadrant pain: Secondary | ICD-10-CM

## 2013-12-31 ENCOUNTER — Ambulatory Visit (HOSPITAL_COMMUNITY)
Admission: RE | Admit: 2013-12-31 | Discharge: 2013-12-31 | Disposition: A | Discharge status: Home / Self Care | Payer: 59 | Source: Ambulatory Visit | Attending: Obstetrics and Gynecology | Admitting: Obstetrics and Gynecology

## 2013-12-31 DIAGNOSIS — R1011 Right upper quadrant pain: Secondary | ICD-10-CM | POA: Insufficient documentation

## 2013-12-31 IMAGING — US US ABDOMEN COMPLETE
1 series · 14 of 25 positions shown · non-contrast
Comparison: None.

CLINICAL DATA: Right upper quadrant abdominal pain. Initial
encounter

EXAM:
ULTRASOUND ABDOMEN COMPLETE

[Series 1: us abdomen complete · 0.27mm/px · 14 of 83 slices shown]
[im 1/83]
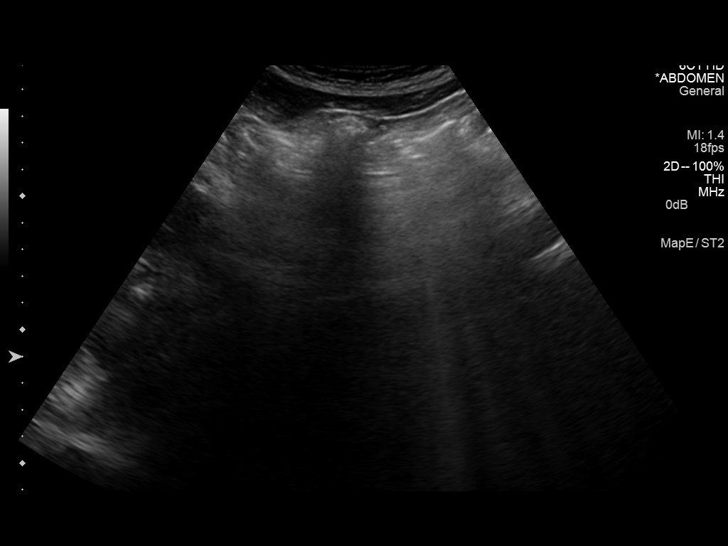
[im 7/83]
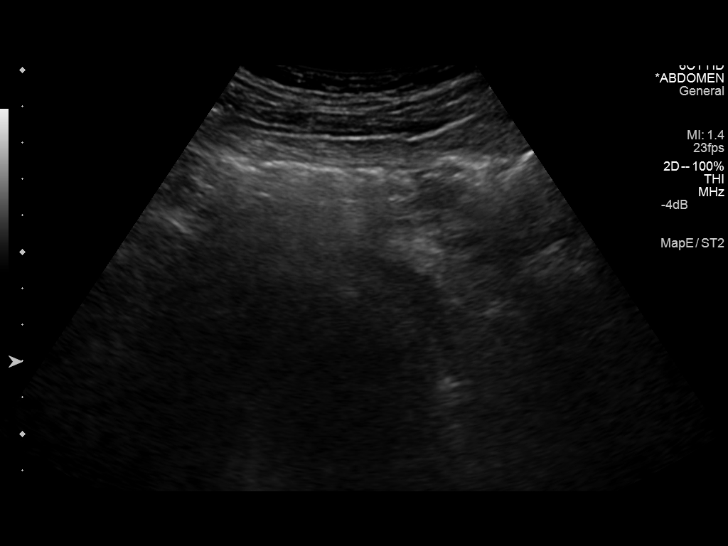
[im 14/83]
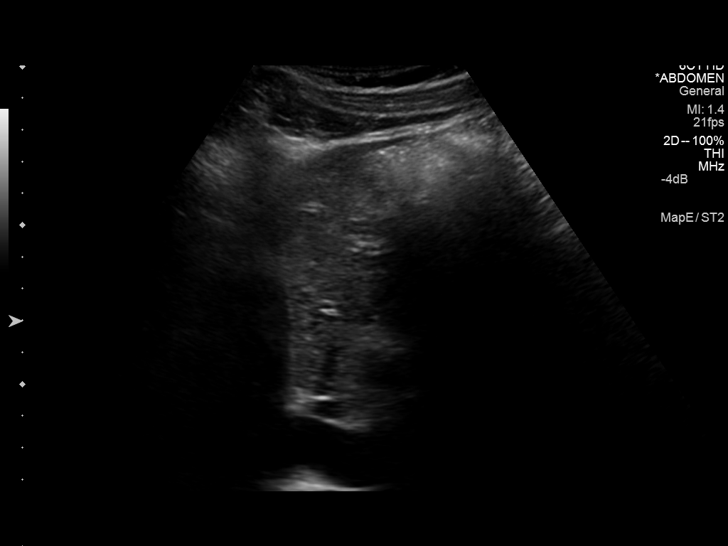
[im 21/83]
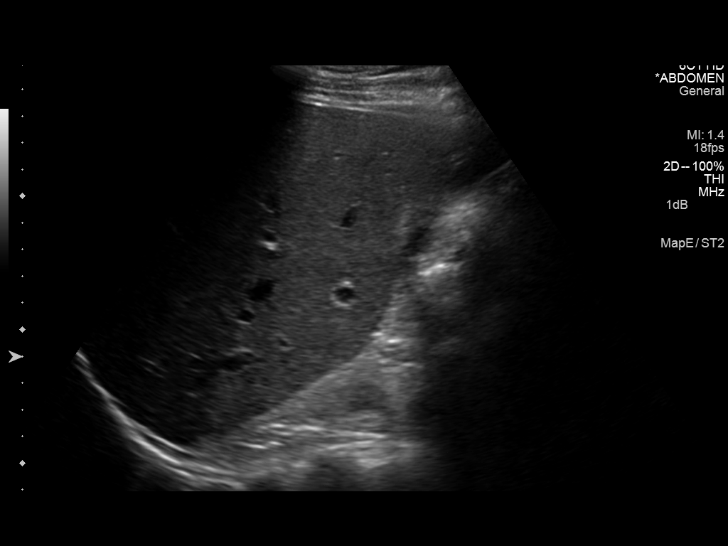
[im 28/83]
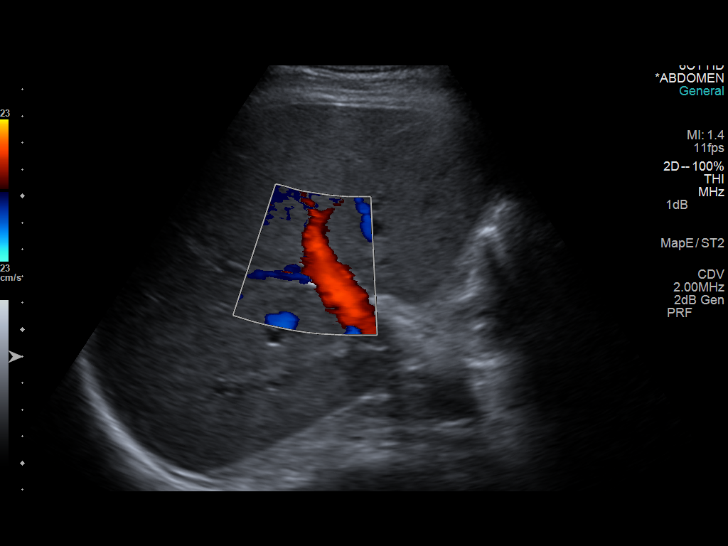
[im 31/83]
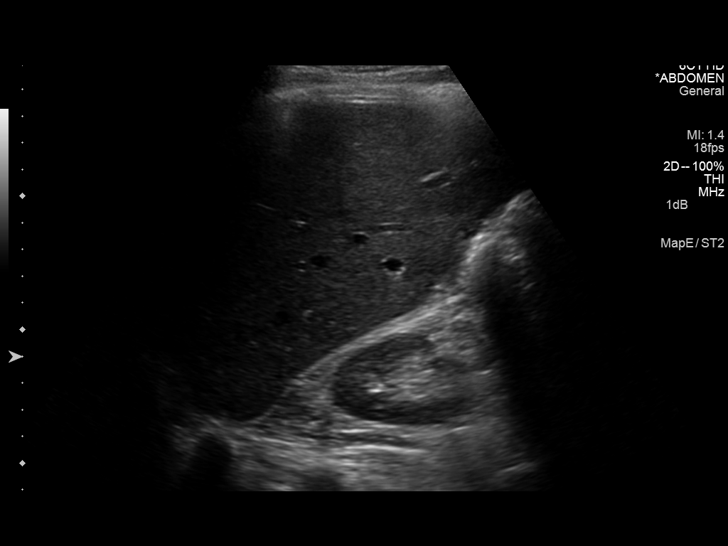
[im 38/83]
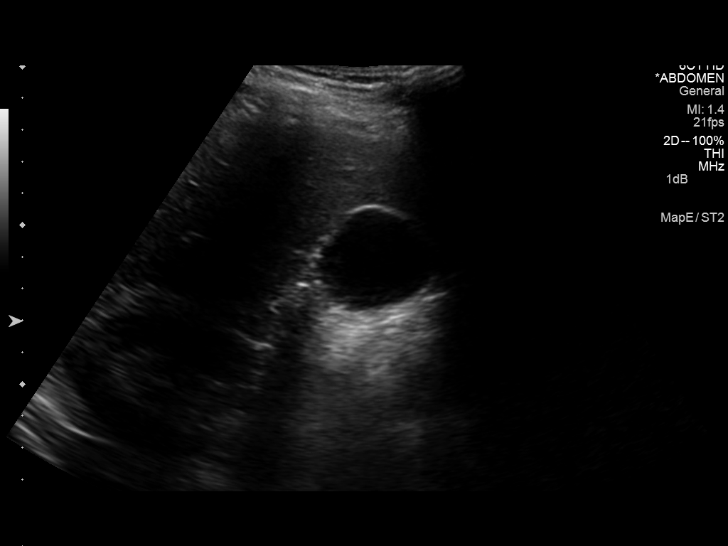
[im 45/83]
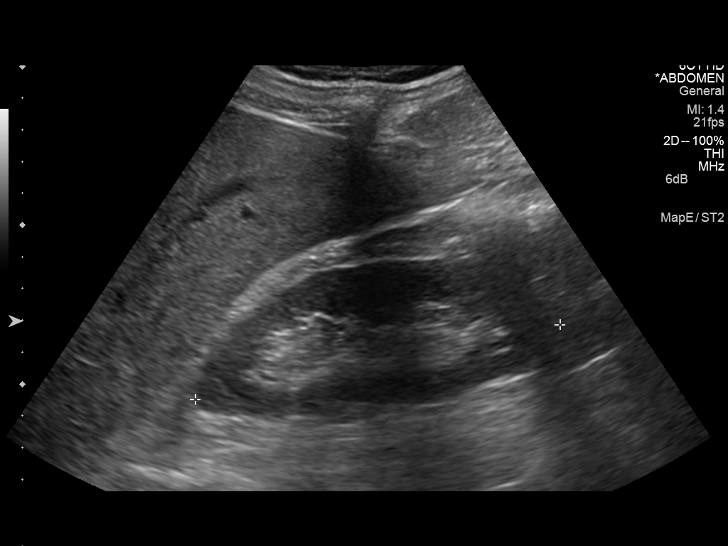
[im 52/83]
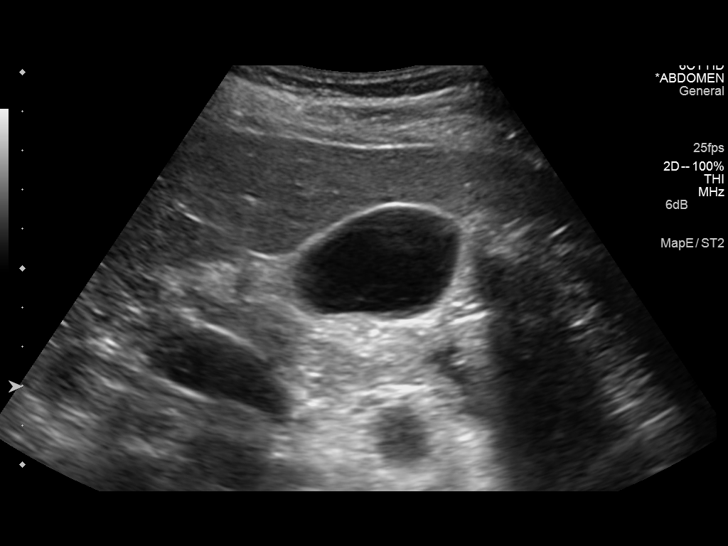
[im 55/83]
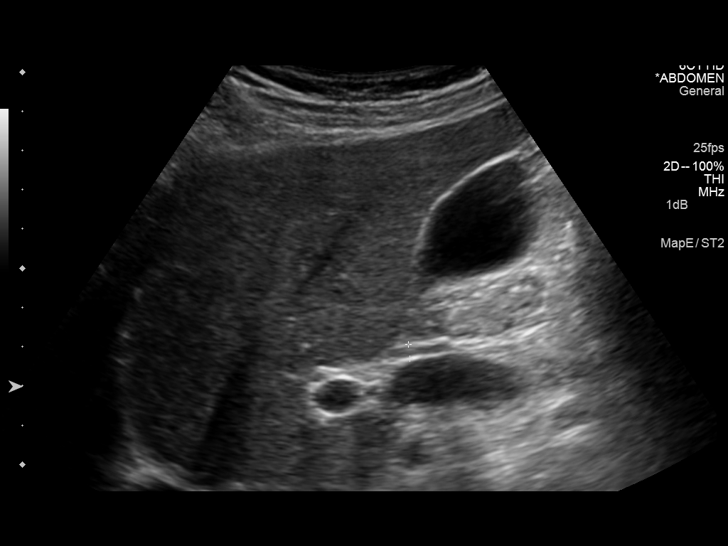
[im 62/83]
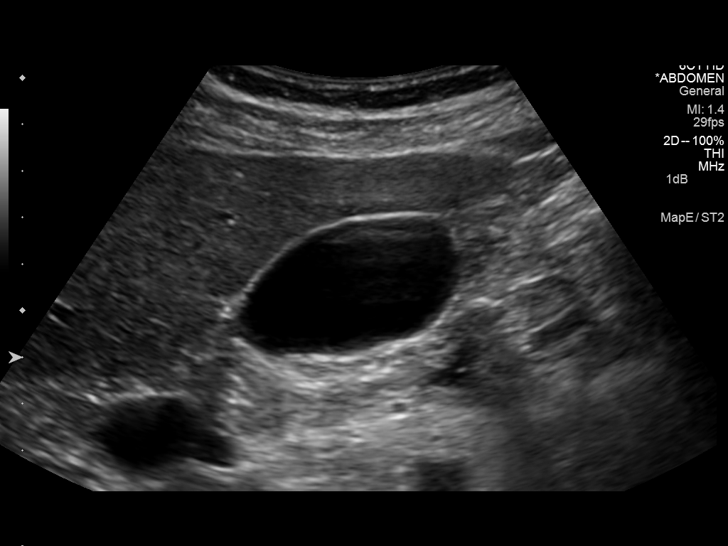
[im 69/83]
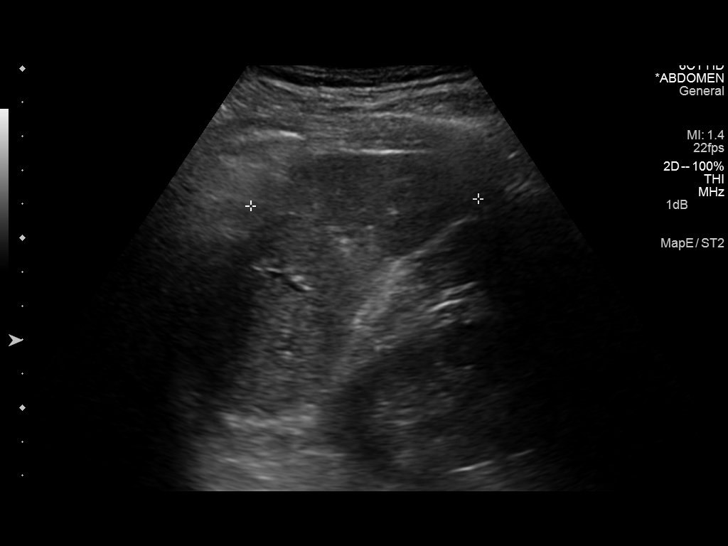
[im 76/83]
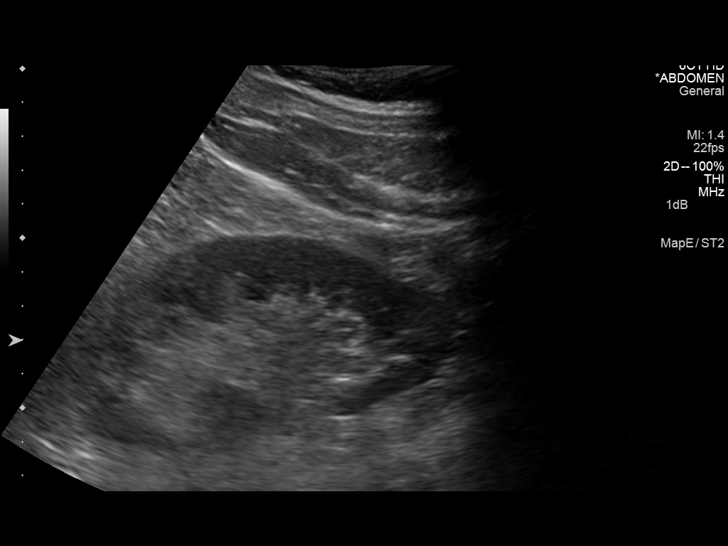
[im 83/83]
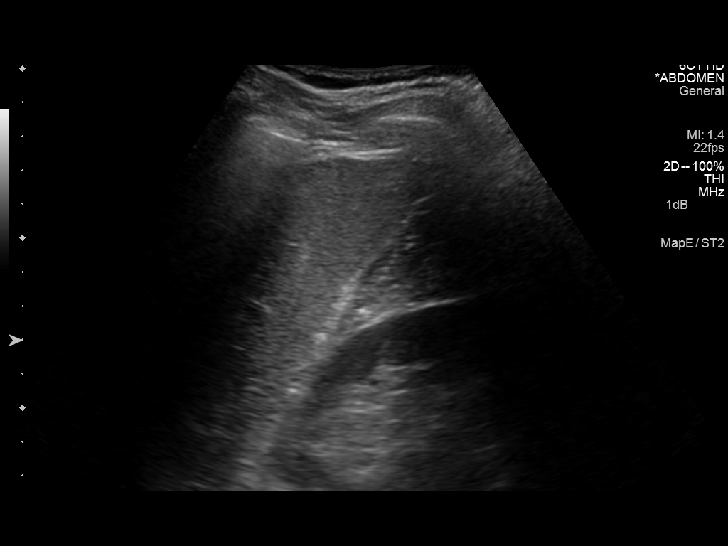

[14 of 25 positions shown; findings below may reference images not displayed]

FINDINGS: Gallbladder: Small floating echogenic foci without shadowing. No
measurable calculus. No acute cholecystitis.

Common bile duct: Diameter: 4 mm. Where visualized, no filling
defect.

Liver: No focal lesion identified. Within normal limits in
parenchymal echogenicity. Antegrade flow in the imaged portal venous
system

IVC: No abnormality visualized.

Pancreas: Visualized portion unremarkable.

Spleen: Size and appearance within normal limits.

Right Kidney: Length: 12 cm. Echogenicity within normal limits. No
mass or hydronephrosis visualized.

Left Kidney: Length: 11 cm. Echogenicity within normal limits. No
mass or hydronephrosis visualized.

Abdominal aorta: No aneurysm visualized.

Other findings: None.
IMPRESSION: 1. No acute findings.
2. Cholesterol crystals/microlithiasis likely present in the
gallbladder.

## 2014-01-02 ENCOUNTER — Other Ambulatory Visit: Payer: Self-pay | Admitting: Gastroenterology

## 2014-01-02 DIAGNOSIS — R1011 Right upper quadrant pain: Secondary | ICD-10-CM

## 2014-01-05 ENCOUNTER — Ambulatory Visit
Admission: RE | Admit: 2014-01-05 | Discharge: 2014-01-05 | Disposition: A | Discharge status: Home / Self Care | Payer: 59 | Source: Ambulatory Visit | Attending: Gastroenterology | Admitting: Gastroenterology

## 2014-01-05 DIAGNOSIS — R1011 Right upper quadrant pain: Secondary | ICD-10-CM

## 2014-01-05 IMAGING — CT CT ABDOMEN W/ CM
2 of 5 series · 16 of 46 positions shown, 18 images · IV contrast (READICAT/WATER & [ID] OMNI 300)
Comparison: None.

CLINICAL DATA: Right upper quadrant pain with burning sensation for
6 weeks.

EXAM:
CT ABDOMEN WITH CONTRAST
TECHNIQUE: Multidetector CT imaging of the abdomen was performed using the
standard protocol following bolus administration of intravenous
contrast.
CONTRAST:  100mL OMNIPAQUE IOHEXOL 300 MG/ML  SOLN

[Series 2: abdomen w/ · axial · 0.70mm/px · z∈[-258,-12]mm · 13 of 56 slices shown, 15 images]
[im 4/56  soft-tissue]
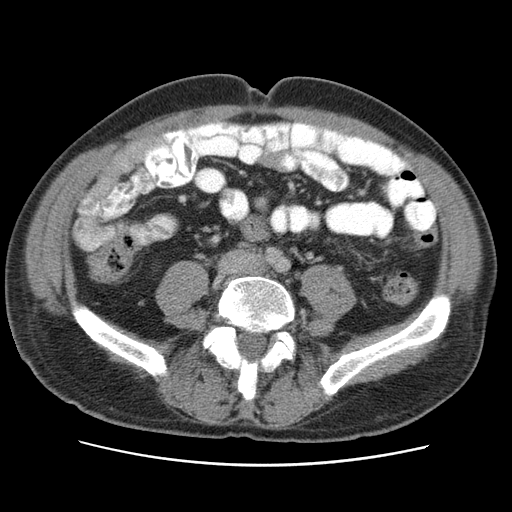
[im 4/56  bone]
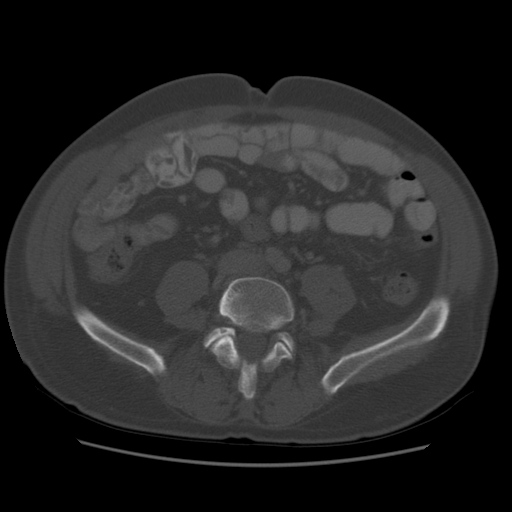
[im 8/56  soft-tissue]
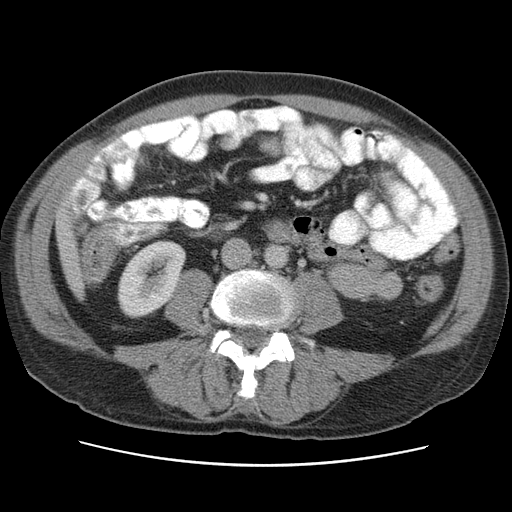
[im 12/56  soft-tissue]
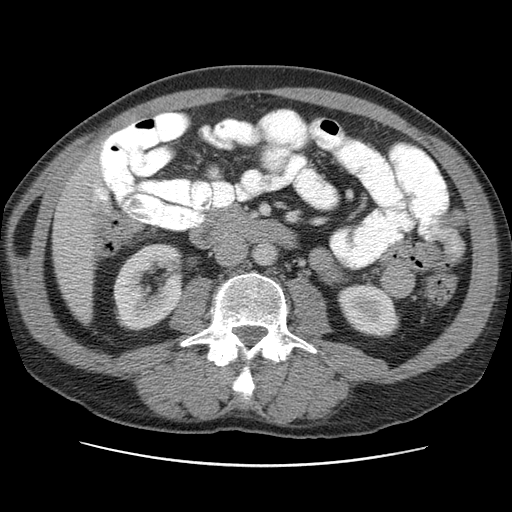
[im 16/56  soft-tissue]
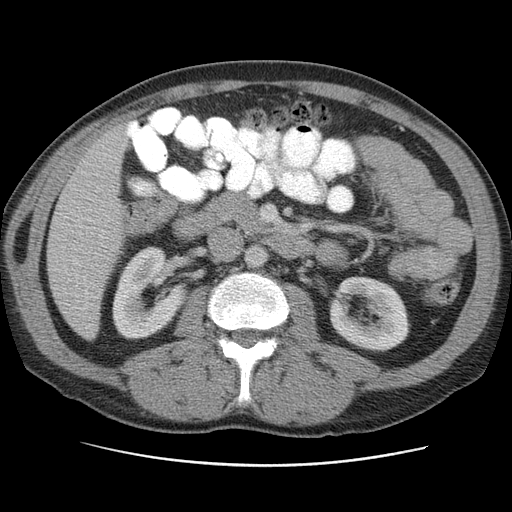
[im 20/56  soft-tissue]
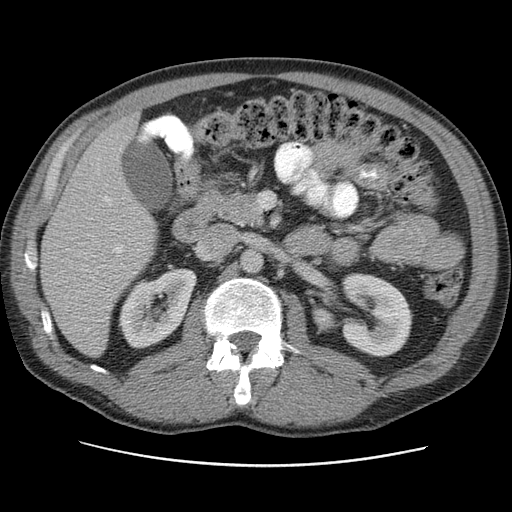
[im 24/56  soft-tissue]
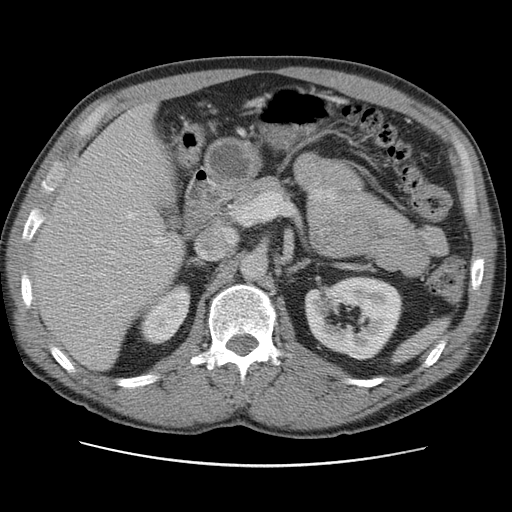
[im 28/56  soft-tissue]
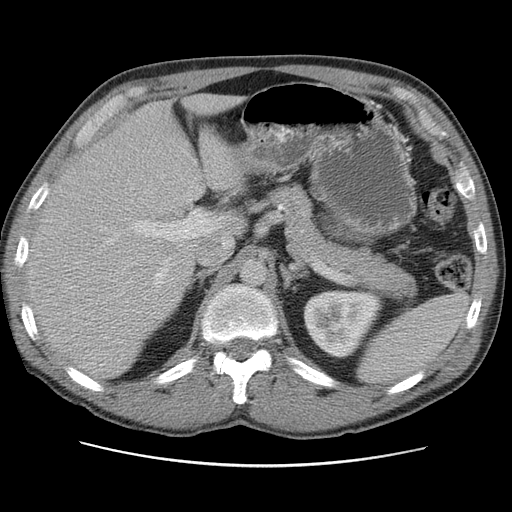
[im 32/56  soft-tissue]
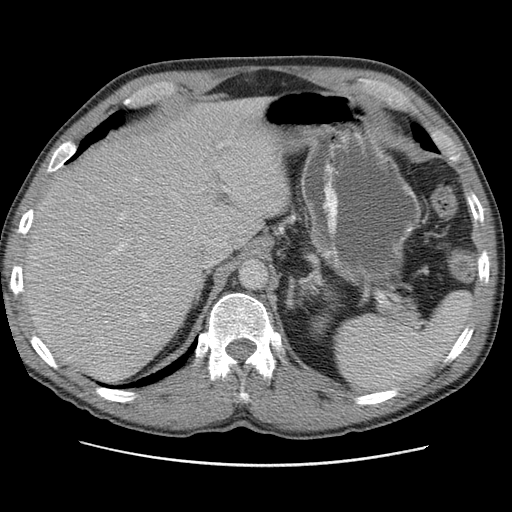
[im 36/56  soft-tissue]
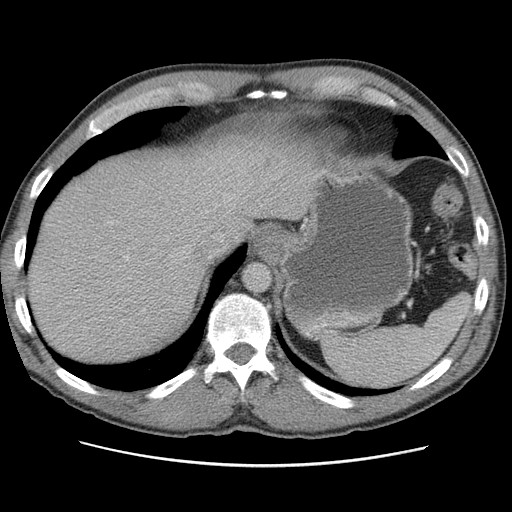
[im 36/56  bone]
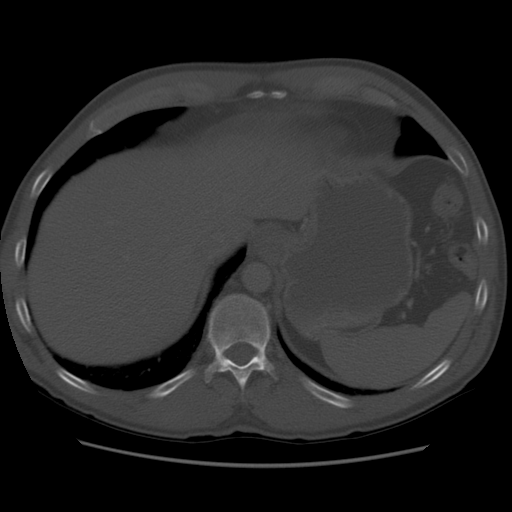
[im 40/56  soft-tissue]
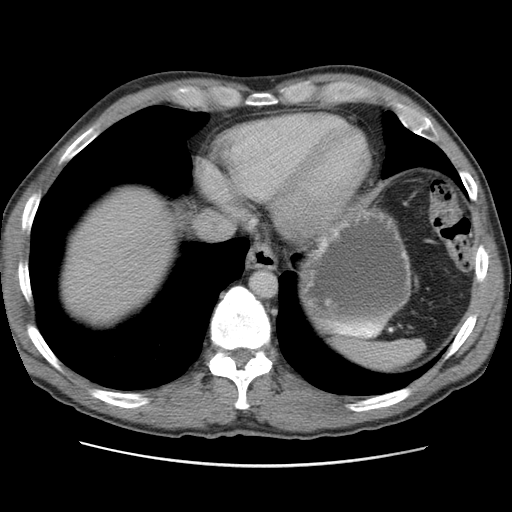
[im 44/56  soft-tissue]
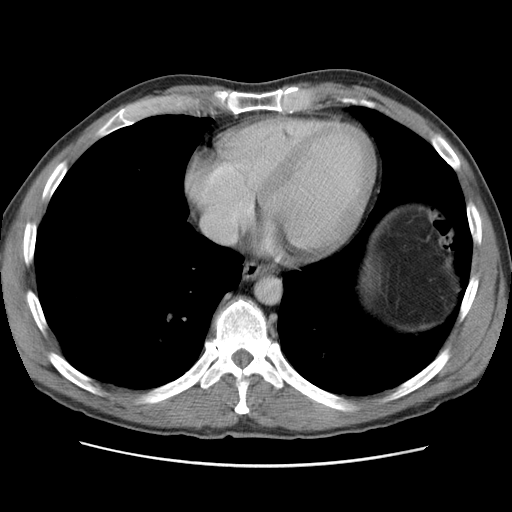
[im 48/56  soft-tissue]
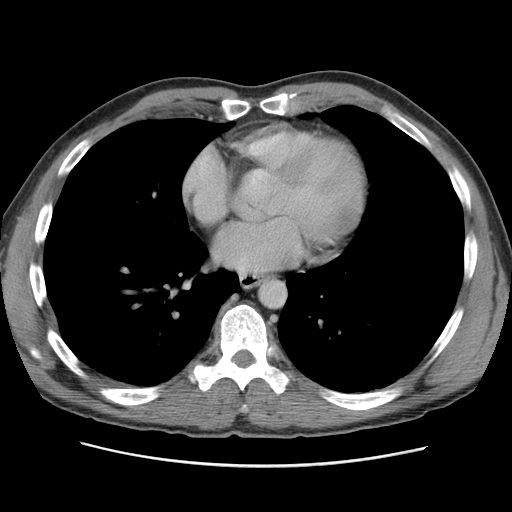
[im 52/56  soft-tissue]
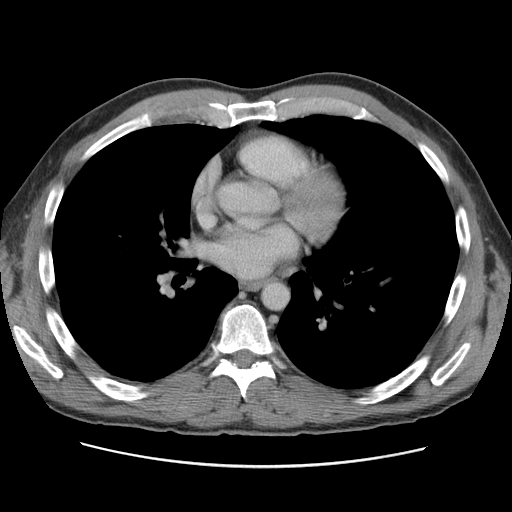

[Series 401: cor · coronal · 0.70mm/px · 3 of 131 slices shown]
[im 44/131  soft-tissue]
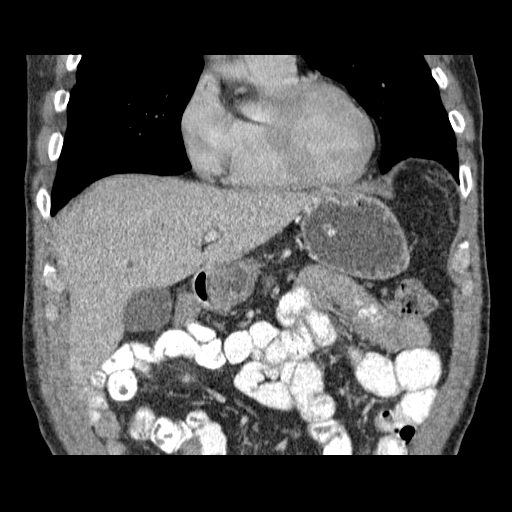
[im 58/131  soft-tissue]
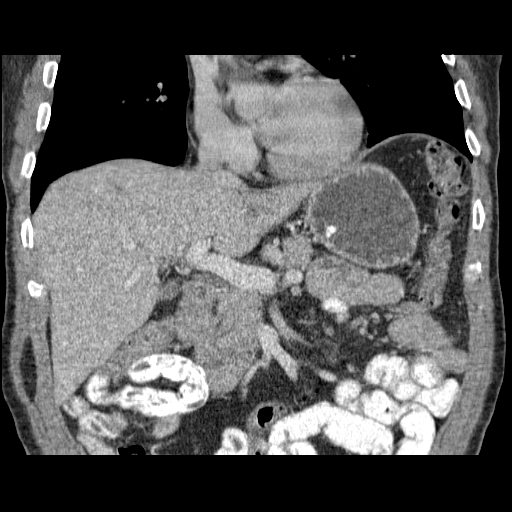
[im 73/131  soft-tissue]
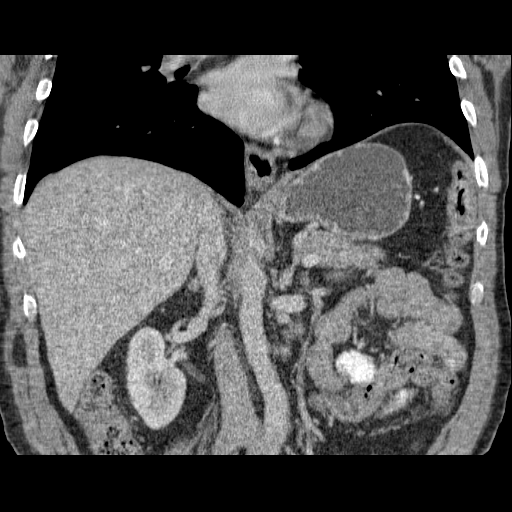

[16 of 46 positions shown; findings below may reference images not displayed]

FINDINGS: Lower chest: The lung bases appear clear. No pleural or pericardial
effusion.

Hepatobiliary: Multiple low-attenuation foci are noted within the
liver measuring up to 7 mm. These are too small to characterize. The
gallbladder appears within normal limits. No biliary dilatation.

Pancreas: Normal appearance of the pancreas.

Spleen: The spleen is normal.

Adrenals/Urinary Tract: The adrenal glands are both on unremarkable.
Unremarkable appearance of both kidneys.

Stomach/Bowel: The stomach is normal. The small bowel loops have a
normal course and caliber without obstruction. Visualized portions
of the colon are also within normal limits.

Vascular/Lymphatic: Normal caliber of the abdominal aorta. Mild
calcified atherosclerotic change noted. No enlarged retroperitoneal
lymph nodes identified. No mesenteric adenopathy.

Other: No free fluid or fluid collections identified.

Musculoskeletal: Review of the visualized osseous structures is
significant for degenerative disc disease within the lower thoracic
spine.
IMPRESSION: 1. No acute findings.  No mass or adenopathy identified.
2. There are several low-attenuation foci within both lobes of the
liver. These are too small to reliably characterize.

## 2014-01-05 MED ORDER — IOHEXOL 300 MG/ML  SOLN
100.0000 mL | Freq: Once | INTRAMUSCULAR | Status: AC | PRN
  Administered 2014-01-05: 100 mL via INTRAVENOUS

## 2014-01-06 ENCOUNTER — Other Ambulatory Visit: Payer: Self-pay | Admitting: Gastroenterology

## 2014-01-06 DIAGNOSIS — R1011 Right upper quadrant pain: Secondary | ICD-10-CM

## 2014-01-14 ENCOUNTER — Encounter (HOSPITAL_COMMUNITY)
Admission: RE | Admit: 2014-01-14 | Discharge: 2014-01-14 | Disposition: A | Discharge status: Home / Self Care | Payer: 59 | Source: Ambulatory Visit | Attending: Gastroenterology | Admitting: Gastroenterology

## 2014-01-14 DIAGNOSIS — R1011 Right upper quadrant pain: Secondary | ICD-10-CM | POA: Diagnosis not present

## 2014-01-14 IMAGING — NM NM HEPATO W/GB/PHARM/[PERSON_NAME]
3 series · 13 of 13 positions shown · non-contrast
Comparison: CT abdomen and pelvis [DATE] and abdominal
ultrasound [DATE]

CLINICAL DATA: Right upper quadrant pain, back pain, and nausea for
7 weeks.

EXAM:
NUCLEAR MEDICINE HEPATOBILIARY IMAGING WITH GALLBLADDER EF
TECHNIQUE: Sequential images of the abdomen were obtained [DATE] minutes
following intravenous administration of radiopharmaceutical. After
slow intravenous infusion of 1.59 micrograms Cholecystokinin,
gallbladder ejection fraction was determined.
RADIOPHARMACEUTICALS:  5 Millicurie [70] Choletec

[he hepatobiliary · 3.43mm/px · 6 of 30 frames shown (1 of 3)]
[frame 3/30]
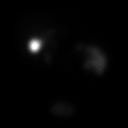
[frame 8/30]
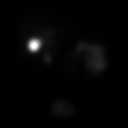
[frame 13/30]
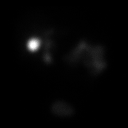
[frame 18/30]
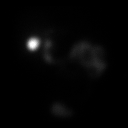
[frame 23/30]
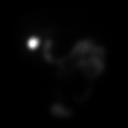
[frame 28/30]
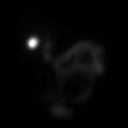

[he hepatobiliary · 1 of 1 slices shown (2 of 3)]
[im 1/1]
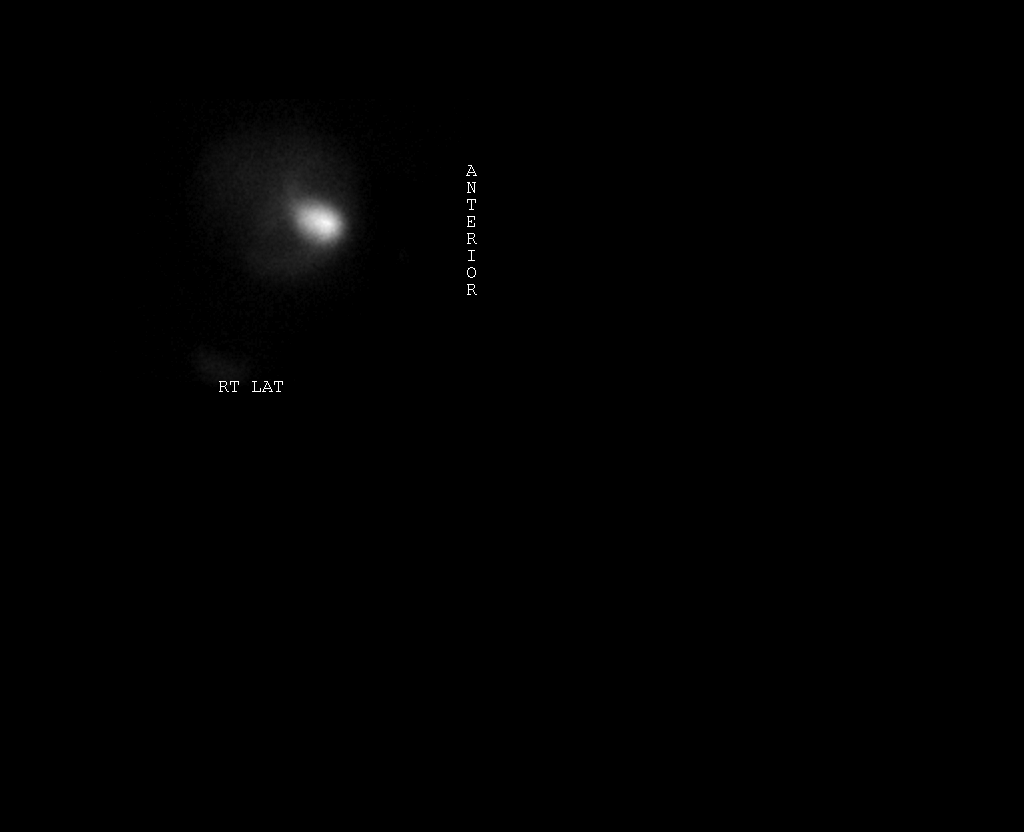

[he hepatobiliary · 3.43mm/px · 6 of 56 frames shown (3 of 3)]
[frame 5/56]
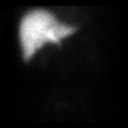
[frame 14/56]
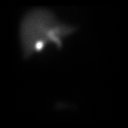
[frame 24/56]
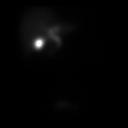
[frame 33/56]
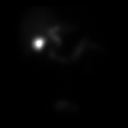
[frame 42/56]
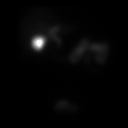
[frame 52/56]
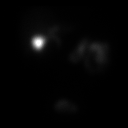

[13 of 13 positions shown; findings below may reference images not displayed]

FINDINGS: There is prompt radiotracer uptake by the liver with excretion into
the biliary system. Gallbladder activity is identified at 10 min.
Small bowel activity is identified at 25-30 min. After the
administration of CCK, gallbladder ejection fraction is calculated
as 43.9% at 27 min. At 30 min, normal ejection fraction is greater
than 30%.

The patient did experience symptoms during CCK infusion.
IMPRESSION: 1. No evidence of cystic duct obstruction. Normal gallbladder
ejection fraction.
2. Patient did experience pain during CCK infusion.

## 2014-01-14 MED ORDER — TECHNETIUM TC 99M MEBROFENIN IV KIT
5.0000 | PACK | Freq: Once | INTRAVENOUS | Status: AC | PRN
  Administered 2014-01-14: 5 via INTRAVENOUS

## 2014-01-14 MED ORDER — SINCALIDE 5 MCG IJ SOLR
0.0200 ug/kg | Freq: Once | INTRAMUSCULAR | Status: AC
  Administered 2014-01-14: 1.59 ug via INTRAVENOUS

## 2014-01-14 MED ORDER — STERILE WATER FOR INJECTION IJ SOLN
INTRAMUSCULAR | Status: AC
  Filled 2014-01-14: qty 10

## 2014-01-14 MED ORDER — SINCALIDE 5 MCG IJ SOLR
INTRAMUSCULAR | Status: AC
  Administered 2014-01-14: 1.59 ug via INTRAVENOUS
  Filled 2014-01-14: qty 5

## 2014-01-29 ENCOUNTER — Observation Stay (HOSPITAL_COMMUNITY)
Admission: EM | Admit: 2014-01-29 | Discharge: 2014-01-31 | Disposition: A | Discharge status: Home / Self Care | Payer: 59 | Attending: Surgery | Admitting: Surgery

## 2014-01-29 ENCOUNTER — Other Ambulatory Visit (INDEPENDENT_AMBULATORY_CARE_PROVIDER_SITE_OTHER): Payer: Self-pay | Admitting: Surgery

## 2014-01-29 ENCOUNTER — Encounter (HOSPITAL_COMMUNITY): Payer: Self-pay | Admitting: Emergency Medicine

## 2014-01-29 DIAGNOSIS — Z888 Allergy status to other drugs, medicaments and biological substances status: Secondary | ICD-10-CM | POA: Insufficient documentation

## 2014-01-29 DIAGNOSIS — I10 Essential (primary) hypertension: Secondary | ICD-10-CM | POA: Diagnosis not present

## 2014-01-29 DIAGNOSIS — F329 Major depressive disorder, single episode, unspecified: Secondary | ICD-10-CM | POA: Insufficient documentation

## 2014-01-29 DIAGNOSIS — Z79899 Other long term (current) drug therapy: Secondary | ICD-10-CM | POA: Insufficient documentation

## 2014-01-29 DIAGNOSIS — K811 Chronic cholecystitis: Principal | ICD-10-CM | POA: Insufficient documentation

## 2014-01-29 DIAGNOSIS — Z9049 Acquired absence of other specified parts of digestive tract: Secondary | ICD-10-CM

## 2014-01-29 DIAGNOSIS — K828 Other specified diseases of gallbladder: Secondary | ICD-10-CM | POA: Diagnosis present

## 2014-01-29 DIAGNOSIS — J45909 Unspecified asthma, uncomplicated: Secondary | ICD-10-CM | POA: Diagnosis not present

## 2014-01-29 DIAGNOSIS — F419 Anxiety disorder, unspecified: Secondary | ICD-10-CM | POA: Insufficient documentation

## 2014-01-29 DIAGNOSIS — K8020 Calculus of gallbladder without cholecystitis without obstruction: Secondary | ICD-10-CM

## 2014-01-29 LAB — CBC WITH DIFFERENTIAL/PLATELET
BASOS PCT: 3 % — AB (ref 0–1)
Basophils Absolute: 0.1 10*3/uL (ref 0.0–0.1)
EOS ABS: 0.2 10*3/uL (ref 0.0–0.7)
Eosinophils Relative: 5 % (ref 0–5)
HEMATOCRIT: 43.4 % (ref 39.0–52.0)
HEMOGLOBIN: 15.2 g/dL (ref 13.0–17.0)
LYMPHS ABS: 1.3 10*3/uL (ref 0.7–4.0)
Lymphocytes Relative: 35 % (ref 12–46)
MCH: 30.2 pg (ref 26.0–34.0)
MCHC: 35 g/dL (ref 30.0–36.0)
MCV: 86.1 fL (ref 78.0–100.0)
MONO ABS: 0.3 10*3/uL (ref 0.1–1.0)
Monocytes Relative: 9 % (ref 3–12)
NEUTROS ABS: 1.7 10*3/uL (ref 1.7–7.7)
NEUTROS PCT: 48 % (ref 43–77)
Platelets: 213 10*3/uL (ref 150–400)
RBC: 5.04 MIL/uL (ref 4.22–5.81)
RDW: 12.9 % (ref 11.5–15.5)
WBC: 3.6 10*3/uL — ABNORMAL LOW (ref 4.0–10.5)

## 2014-01-29 LAB — COMPREHENSIVE METABOLIC PANEL
ALT: 16 U/L (ref 0–53)
ANION GAP: 14 (ref 5–15)
AST: 19 U/L (ref 0–37)
Albumin: 4.2 g/dL (ref 3.5–5.2)
Alkaline Phosphatase: 53 U/L (ref 39–117)
BUN: 12 mg/dL (ref 6–23)
CALCIUM: 10.1 mg/dL (ref 8.4–10.5)
CO2: 25 mEq/L (ref 19–32)
Chloride: 100 mEq/L (ref 96–112)
Creatinine, Ser: 0.88 mg/dL (ref 0.50–1.35)
GLUCOSE: 100 mg/dL — AB (ref 70–99)
Potassium: 4 mEq/L (ref 3.7–5.3)
Sodium: 139 mEq/L (ref 137–147)
TOTAL PROTEIN: 7.3 g/dL (ref 6.0–8.3)
Total Bilirubin: 0.5 mg/dL (ref 0.3–1.2)

## 2014-01-29 LAB — LIPASE, BLOOD: Lipase: 27 U/L (ref 11–59)

## 2014-01-29 MED ORDER — ACETAMINOPHEN 650 MG RE SUPP: 650.0000 mg | Freq: Four times a day (QID) | RECTAL | Status: DC | PRN

## 2014-01-29 MED ORDER — ONDANSETRON HCL 4 MG/2ML IJ SOLN
4.0000 mg | Freq: Four times a day (QID) | INTRAMUSCULAR | Status: DC | PRN
Start: 2014-01-29 — End: 2014-01-31

## 2014-01-29 MED ORDER — LORATADINE 10 MG PO TABS
10.0000 mg | ORAL_TABLET | Freq: Every day | ORAL | Status: DC
  Filled 2014-01-29 (×3): qty 1

## 2014-01-29 MED ORDER — ACETAMINOPHEN 325 MG PO TABS
650.0000 mg | ORAL_TABLET | Freq: Four times a day (QID) | ORAL | Status: DC | PRN
Start: 2014-01-29 — End: 2014-01-31

## 2014-01-29 MED ORDER — HEPARIN SODIUM (PORCINE) 5000 UNIT/ML IJ SOLN
5000.0000 [IU] | Freq: Three times a day (TID) | INTRAMUSCULAR | Status: AC
  Administered 2014-01-29: 5000 [IU] via SUBCUTANEOUS
  Filled 2014-01-29 (×2): qty 1

## 2014-01-29 MED ORDER — PANTOPRAZOLE SODIUM 40 MG IV SOLR
40.0000 mg | Freq: Every day | INTRAVENOUS | Status: DC
Start: 2014-01-29 — End: 2014-01-31
  Administered 2014-01-29 – 2014-01-30 (×2): 40 mg via INTRAVENOUS
  Filled 2014-01-29 (×3): qty 40

## 2014-01-29 MED ORDER — ONDANSETRON HCL 4 MG/2ML IJ SOLN
4.0000 mg | Freq: Once | INTRAMUSCULAR | Status: AC
  Administered 2014-01-29: 4 mg via INTRAVENOUS
  Filled 2014-01-29: qty 2

## 2014-01-29 MED ORDER — DEXTROSE 5 % IV SOLN
2.0000 g | INTRAVENOUS | Status: AC
  Administered 2014-01-30: 2 g via INTRAVENOUS
  Filled 2014-01-29: qty 2

## 2014-01-29 MED ORDER — MORPHINE SULFATE 4 MG/ML IJ SOLN
4.0000 mg | Freq: Once | INTRAMUSCULAR | Status: AC
  Administered 2014-01-29: 4 mg via INTRAVENOUS
  Filled 2014-01-29: qty 1

## 2014-01-29 MED ORDER — PROMETHAZINE HCL 25 MG/ML IJ SOLN: 12.5000 mg | Freq: Four times a day (QID) | INTRAMUSCULAR | Status: DC | PRN

## 2014-01-29 MED ORDER — ALBUTEROL SULFATE (2.5 MG/3ML) 0.083% IN NEBU
2.5000 mg | INHALATION_SOLUTION | Freq: Four times a day (QID) | RESPIRATORY_TRACT | Status: DC | PRN
  Administered 2014-01-29 – 2014-01-31 (×3): 2.5 mg via RESPIRATORY_TRACT
  Filled 2014-01-29 (×3): qty 3

## 2014-01-29 MED ORDER — DIPHENHYDRAMINE HCL 50 MG/ML IJ SOLN
12.5000 mg | Freq: Four times a day (QID) | INTRAMUSCULAR | Status: DC | PRN
  Administered 2014-01-29: 25 mg via INTRAVENOUS
  Filled 2014-01-29: qty 1

## 2014-01-29 MED ORDER — OXYCODONE-ACETAMINOPHEN 5-325 MG PO TABS
1.0000 | ORAL_TABLET | ORAL | Status: DC | PRN
Start: 2014-01-29 — End: 2014-01-31
  Administered 2014-01-29 – 2014-01-30 (×2): 2 via ORAL
  Filled 2014-01-29 (×2): qty 2

## 2014-01-29 MED ORDER — ALPRAZOLAM 0.25 MG PO TABS
0.2500 mg | ORAL_TABLET | Freq: Every evening | ORAL | Status: DC | PRN
Start: 2014-01-29 — End: 2014-01-31
  Administered 2014-01-29: 0.25 mg via ORAL
  Filled 2014-01-29: qty 1

## 2014-01-29 MED ORDER — HYDROMORPHONE HCL 1 MG/ML IJ SOLN
0.5000 mg | INTRAMUSCULAR | Status: DC | PRN
  Administered 2014-01-29: 1 mg via INTRAVENOUS
  Filled 2014-01-29: qty 1

## 2014-01-29 MED ORDER — ALBUTEROL SULFATE HFA 108 (90 BASE) MCG/ACT IN AERS: 1.0000 | INHALATION_SPRAY | Freq: Four times a day (QID) | RESPIRATORY_TRACT | Status: DC | PRN

## 2014-01-29 MED ORDER — LOSARTAN POTASSIUM 50 MG PO TABS
100.0000 mg | ORAL_TABLET | Freq: Every day | ORAL | Status: DC
  Filled 2014-01-29: qty 2

## 2014-01-29 NOTE — ED Notes (Signed)
Per pt, states gallbladder issues for over 2 months-states cant get surgery until December-unable to eat

## 2014-01-29 NOTE — H&P (Signed)
Edwin Brown is an 58 y.o. male.   PCP: Osborne Casco, MD GI:  Dr. 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  He has seen his PCP, and GI.  He had an EGD back on 01/21/14.  This is normal aside from some mild gastritis.  Abdominal US on 12/31/13 showed Cholesterol crystals/microlithiasis likely present in the gallbladder.  CT scan on 01/05/14 showed no acute findings.  He most recently had a HIDA scan which shows, There is prompt radiotracer uptake by the liver with excretion into the biliary system. Gallbladder activity is identified at 10 min. Small bowel activity is identified at 25-30 min. After the administration of CCK, gallbladder ejection fraction is calculated as 43.9% at 27 min. At 30 min, normal ejection fraction is greater than 30%.  He did have pain with the CCK.  He was seen today by Dr. Ninfa Linden who recommended cholecystectomy and was planning to do it, but has no availability for over a month.   Pt has ongoing pain, worse with any PO's.  He feel bloated after eating anything.  He has lost 10 pounds over the last 3 weeks.  His pain is constant, primarily in the RUQ;  he did not feel he could continue for 4 more weeks before obtaining relief. He was directed to the ED at our office.  He continues to have pain RUQ. He has just completed a course of antibiotics.   Past Medical History  Diagnosis Date  HTN (hypertension)   Anxiety   Depression   Asthma, uses MDI inhaler a couple times per week   Syncope      Past Surgical History  Procedure Laterality Date  . Nasal sinus surgery    . Appendectomy    . Vasectomy      Family History  Problem Relation Age of Onset  . Heart attack Father   . Heart attack Brother    Social History:  reports that he has never smoked. He does not have any smokeless tobacco history on file. He reports that he drinks alcohol. He reports that he does not use illicit  drugs.  Allergies:  Allergies  Allergen Reactions  . Phenergan [Promethazine Hcl] Other (See Comments)    Shaky, very out of sorts   . Other     HORSE SERUM TETANUS ANTI TOX : PATIENT GETS HIGH FEVER   Prior to Admission medications   Medication Sig Start Date End Date Taking? Authorizing Provider  albuterol (PROVENTIL HFA;VENTOLIN HFA) 108 (90 BASE) MCG/ACT inhaler Inhale 1 puff into the lungs every 6 (six) hours as needed for wheezing or shortness of breath.   Yes Historical Provider, MD  ALPRAZolam Duanne Moron) 0.25 MG tablet Take 0.25 mg by mouth at bedtime as needed for anxiety.   Yes Historical Provider, MD  clarithromycin (BIAXIN) 500 MG tablet Take 500 mg by mouth 2 (two) times daily. For 10 days 01/18/14  Yes Historical Provider, MD  Glucos-MSM-C-Mn-Ginger-Willow (GLUCOSAMINE MSM COMPLEX PO) Take 2 capsules by mouth daily.    Yes Historical Provider, MD  Influenza Vac Split Quad (FLUZONE) 0.25 ML injection Inject 0.25 mLs into the muscle once.   Yes Historical Provider, MD  loratadine (CLARITIN) 10 MG tablet Take 10 mg by mouth daily as needed for allergies.   Yes Historical Provider, MD  losartan (COZAAR) 100 MG tablet Take 100 mg by mouth daily.   Yes Historical Provider, MD  traMADol (ULTRAM) 50 MG tablet Take 50 mg by mouth every 4 (four) hours as needed for moderate pain.   Yes Historical Provider, MD  Turmeric Curcumin 500 MG CAPS Take 1,000 mg by mouth daily.   Yes Historical Provider, MD      (Not in a hospital admission)  Results for orders placed or performed during the hospital encounter of 01/29/14 (from the past 48 hour(s))  CBC WITH DIFFERENTIAL     Status: Abnormal   Collection Time: 01/29/14 10:59 AM  Result Value Ref Range   WBC 3.6 (L) 4.0 - 10.5 K/uL   RBC 5.04 4.22 - 5.81 MIL/uL   Hemoglobin 15.2 13.0 - 17.0 g/dL   HCT 43.4 39.0 - 52.0 %   MCV 86.1 78.0 - 100.0 fL   MCH 30.2 26.0 - 34.0 pg   MCHC 35.0 30.0 - 36.0 g/dL   RDW 12.9 11.5 - 15.5 %   Platelets  213 150 - 400 K/uL   Neutrophils Relative % 48 43 - 77 %   Lymphocytes Relative 35 12 - 46 %   Monocytes Relative 9 3 - 12 %   Eosinophils Relative 5 0 - 5 %   Basophils Relative 3 (H) 0 - 1 %   Neutro Abs 1.7 1.7 - 7.7 K/uL   Lymphs Abs 1.3 0.7 - 4.0 K/uL   Monocytes Absolute 0.3 0.1 - 1.0 K/uL   Eosinophils Absolute 0.2 0.0 - 0.7 K/uL   Basophils Absolute 0.1 0.0 - 0.1 K/uL   Smear Review MORPHOLOGY UNREMARKABLE   Comprehensive metabolic panel     Status: Abnormal   Collection Time: 01/29/14 10:59 AM  Result Value Ref Range   Sodium 139 137 - 147 mEq/L   Potassium 4.0 3.7 - 5.3 mEq/L   Chloride 100 96 - 112 mEq/L   CO2 25 19 - 32 mEq/L   Glucose, Bld 100 (H) 70 - 99 mg/dL   BUN 12 6 - 23 mg/dL   Creatinine, Ser 0.88 0.50 - 1.35 mg/dL   Calcium 10.1 8.4 - 10.5 mg/dL   Total Protein 7.3 6.0 - 8.3 g/dL   Albumin 4.2 3.5 - 5.2 g/dL   AST 19 0 - 37 U/L   ALT 16 0 - 53 U/L   Alkaline Phosphatase 53 39 - 117 U/L   Total Bilirubin 0.5 0.3 - 1.2 mg/dL   GFR calc non Af Amer >90 >90 mL/min   GFR calc Af Amer >90 >90 mL/min    Comment: (NOTE) The eGFR has been calculated using the CKD EPI equation. This calculation has not been validated in all clinical situations. eGFR's persistently <90 mL/min signify possible Chronic Kidney Disease.    Anion gap 14 5 - 15  Lipase, blood     Status: None   Collection Time: 01/29/14 10:59 AM  Result Value Ref Range   Lipase 27 11 - 59 U/L   No results found.  Review of Systems  Constitutional: Positive for weight loss (10 pounds last 3 weeks) and malaise/fatigue.  HENT: Negative.   Eyes: Negative.   Respiratory: Negative.   Cardiovascular: Negative.   Gastrointestinal: Positive for heartburn (indegestion and bloating, says he feels like he had Thanksgiving dinner after every meal.), nausea and abdominal pain. Negative for diarrhea, constipation, blood in stool and melena.  Genitourinary: Negative.   Musculoskeletal: Negative.   Skin:  Negative.   Neurological: Negative.   Endo/Heme/Allergies: Negative.   Psychiatric/Behavioral: Positive for depression. The patient is nervous/anxious.  Trouble sleeping.    Blood pressure 130/88, pulse 74, temperature 98.1 F (36.7 C), temperature source Oral, resp. rate 17, SpO2 100 %. Physical Exam   Assessment/Plan Biliary dyskinesia  Hypertension Anxiety/depression Asthma  Plan:  Surgery in the AM.  Clear liquids as tolerated, PPI, recheck labs in AM.    Jacklin Zwick 01/29/2014, 1:44 PM

## 2014-01-29 NOTE — ED Provider Notes (Signed)
CSN: [id number]     Arrival date & time 01/29/14  1021 History   First MD Initiated Contact with Patient 01/29/14 1042     Chief Complaint  Patient presents with  . RUQ pain      (Consider location/radiation/quality/duration/timing/severity/associated sxs/prior Treatment) HPI Comments: PT comes in with cc of RUQ abd pain. Pt has had RUQ pain for several weeks now. He has a constant pain, with anorexia and nausea. Pt has lost 10 lbs the last 2 weeks. Pt also has had [place], EGD and HIDA scan done so far, with some microcrystals in the gallbladder and a little sludge appreciated.  Pt was seen by [facility] clinic today. He has been scheduled for surgery in December, but his pain is severe, and he can't wait that long, so he comes to the ER as per the advise of the surgeon.  The history is provided by the patient.    Past Medical History  Diagnosis Date  . HTN (hypertension)   . Syncope    Past Surgical History  Procedure Laterality Date  . Nasal sinus surgery    . Appendectomy    . Vasectomy     Family History  Problem Relation Age of Onset  . Heart attack Father   . Heart attack Brother    History  Substance Use Topics  . Smoking status: Never Smoker   . Smokeless tobacco: Not on file  . Alcohol Use: Yes     Comment: ONCE A DAY     Review of Systems  Constitutional: Positive for unexpected weight change. Negative for activity change and appetite change.  Respiratory: Negative for cough and shortness of breath.   Cardiovascular: Negative for chest pain.  Gastrointestinal: Positive for nausea, vomiting and abdominal pain.  Genitourinary: Negative for dysuria.      Allergies  Phenergan and Other  Home Medications   Prior to Admission medications   Medication Sig Start Date End Date Taking? Authorizing Provider  albuterol (PROVENTIL HFA;VENTOLIN HFA) 108 (90 BASE) MCG/ACT inhaler Inhale 1 puff into the lungs every 6 (six) hours as needed for wheezing or shortness of  breath.   Yes Historical Provider, MD  ALPRAZolam Duanne Moron) 0.25 MG tablet Take 0.25 mg by mouth at bedtime as needed for anxiety.   Yes Historical Provider, MD  clarithromycin (BIAXIN) 500 MG tablet Take 500 mg by mouth 2 (two) times daily. For 10 days 01/18/14  Yes Historical Provider, MD  Glucos-MSM-C-Mn-Ginger-Willow (GLUCOSAMINE MSM COMPLEX PO) Take 2 capsules by mouth daily.    Yes Historical Provider, MD  Influenza Vac Split Quad (FLUZONE) 0.25 ML injection Inject 0.25 mLs into the muscle once.   Yes Historical Provider, MD  loratadine (CLARITIN) 10 MG tablet Take 10 mg by mouth daily as needed for allergies.   Yes Historical Provider, MD  losartan (COZAAR) 100 MG tablet Take 100 mg by mouth daily.   Yes Historical Provider, MD  traMADol (ULTRAM) 50 MG tablet Take 50 mg by mouth every 4 (four) hours as needed for moderate pain.   Yes Historical Provider, MD  Turmeric Curcumin 500 MG CAPS Take 1,000 mg by mouth daily.   Yes Historical Provider, MD   BP 131/74 mmHg  Pulse 66  Temp(Src) 98.1 F (36.7 C) (Oral)  Resp 18  SpO2 88% Physical Exam  Constitutional: He is oriented to person, place, and time. He appears well-developed.  HENT:  Head: Normocephalic and atraumatic.  Eyes: Conjunctivae and EOM are normal. Pupils are equal, round, and  reactive to light.  Neck: Normal range of motion. Neck supple.  Cardiovascular: Normal rate and regular rhythm.   Pulmonary/Chest: Effort normal and breath sounds normal.  Abdominal: Soft. Bowel sounds are normal. He exhibits no distension. There is tenderness. There is guarding. There is no rebound.  RUQ tenderness, with voluntary guarding, - Murphy's  Neurological: He is alert and oriented to person, place, and time.  Skin: Skin is warm.  Nursing note and vitals reviewed.   ED Course  Procedures (including critical care time) Labs Review Labs Reviewed  CBC WITH DIFFERENTIAL - Abnormal; Notable for the following:    WBC 3.6 (*)    Basophils  Relative 3 (*)    All other components within normal limits  COMPREHENSIVE METABOLIC PANEL - Abnormal; Notable for the following:    Glucose, Bld 100 (*)    All other components within normal limits  LIPASE, BLOOD  PATHOLOGIST SMEAR REVIEW    Imaging Review No results found.   EKG Interpretation None      MDM   Final diagnoses:  Calculus of gallbladder without cholecystitis without obstruction    DDx includes: Pancreatitis Hepatobiliary pathology including cholecystitis Gastritis/PUD SBO  PT comes in with cc of abd pain. Suspected to have cholelithiasis, and he is symptomatic, and thus per advise of the surgeon has come to the ER. I have consulted Surgical team to assess the patient, and they will see him in the ER and help with dispo. Pain control for now.  Varney Biles, MD 01/29/14 1247

## 2014-01-30 ENCOUNTER — Inpatient Hospital Stay (HOSPITAL_COMMUNITY): Payer: 59 | Admitting: Anesthesiology

## 2014-01-30 ENCOUNTER — Inpatient Hospital Stay (HOSPITAL_COMMUNITY): Payer: 59

## 2014-01-30 ENCOUNTER — Encounter (HOSPITAL_COMMUNITY)
Admission: EM | Disposition: A | Discharge status: Home / Self Care | Payer: Self-pay | Source: Home / Self Care | Attending: Emergency Medicine

## 2014-01-30 DIAGNOSIS — K828 Other specified diseases of gallbladder: Secondary | ICD-10-CM | POA: Diagnosis present

## 2014-01-30 DIAGNOSIS — Z9049 Acquired absence of other specified parts of digestive tract: Secondary | ICD-10-CM

## 2014-01-30 HISTORY — PX: CHOLECYSTECTOMY: SHX55

## 2014-01-30 LAB — LIPASE, BLOOD: Lipase: 25 U/L (ref 11–59)

## 2014-01-30 LAB — SURGICAL PCR SCREEN
MRSA, PCR: NEGATIVE
Staphylococcus aureus: NEGATIVE

## 2014-01-30 LAB — COMPREHENSIVE METABOLIC PANEL
ALK PHOS: 43 U/L (ref 39–117)
ALT: 12 U/L (ref 0–53)
ANION GAP: 11 (ref 5–15)
AST: 15 U/L (ref 0–37)
Albumin: 3.5 g/dL (ref 3.5–5.2)
BUN: 9 mg/dL (ref 6–23)
CHLORIDE: 100 meq/L (ref 96–112)
CO2: 27 mEq/L (ref 19–32)
Calcium: 9.5 mg/dL (ref 8.4–10.5)
Creatinine, Ser: 0.97 mg/dL (ref 0.50–1.35)
GFR calc Af Amer: >90 mL/min (ref >90)
GFR calc non Af Amer: 89 mL/min — ABNORMAL LOW (ref >90)
Glucose, Bld: 91 mg/dL (ref 70–99)
POTASSIUM: 3.5 meq/L — AB (ref 3.7–5.3)
SODIUM: 138 meq/L (ref 137–147)
TOTAL PROTEIN: 6 g/dL (ref 6.0–8.3)
Total Bilirubin: 0.4 mg/dL (ref 0.3–1.2)

## 2014-01-30 LAB — CBC
HCT: 37.7 % — ABNORMAL LOW (ref 39.0–52.0)
Hemoglobin: 12.6 g/dL — ABNORMAL LOW (ref 13.0–17.0)
MCH: 29 pg (ref 26.0–34.0)
MCHC: 33.4 g/dL (ref 30.0–36.0)
MCV: 86.7 fL (ref 78.0–100.0)
Platelets: 198 10*3/uL (ref 150–400)
RBC: 4.35 MIL/uL (ref 4.22–5.81)
RDW: 12.8 % (ref 11.5–15.5)
WBC: 4.1 10*3/uL (ref 4.0–10.5)

## 2014-01-30 LAB — PATHOLOGIST SMEAR REVIEW

## 2014-01-30 IMAGING — RF DG CHOLANGIOGRAM OPERATIVE
1 series · 4 of 4 positions shown · non-contrast
Comparison: [DATE]

CLINICAL DATA: Cholelithiasis, cholecystectomy

EXAM:
INTRAOPERATIVE CHOLANGIOGRAM
TECHNIQUE: Cholangiographic images from the C-arm fluoroscopic device were
submitted for interpretation post-operatively. Please see the
procedural report for the amount of contrast and the fluoroscopy
time utilized.

[Series 1: run · 4 of 95 frames shown]
[frame 15/95]
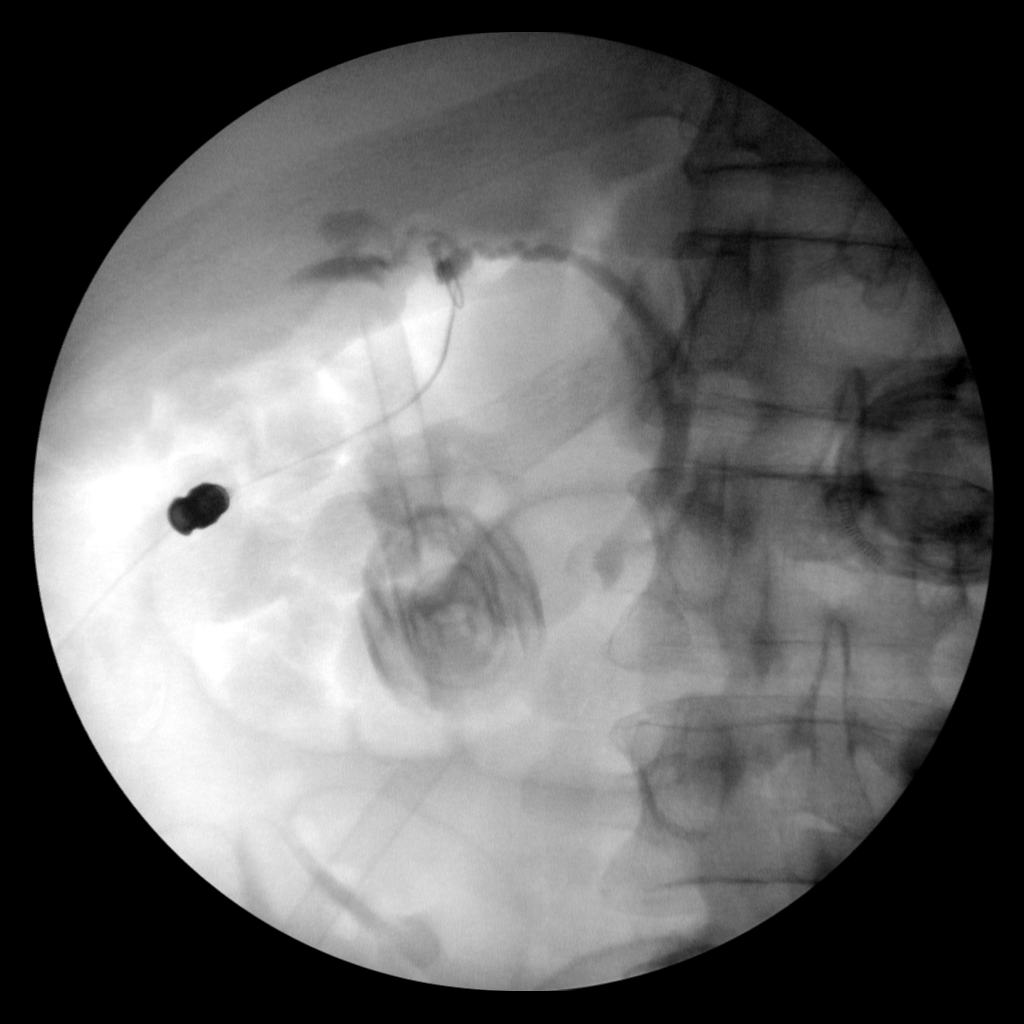
[frame 48/95]
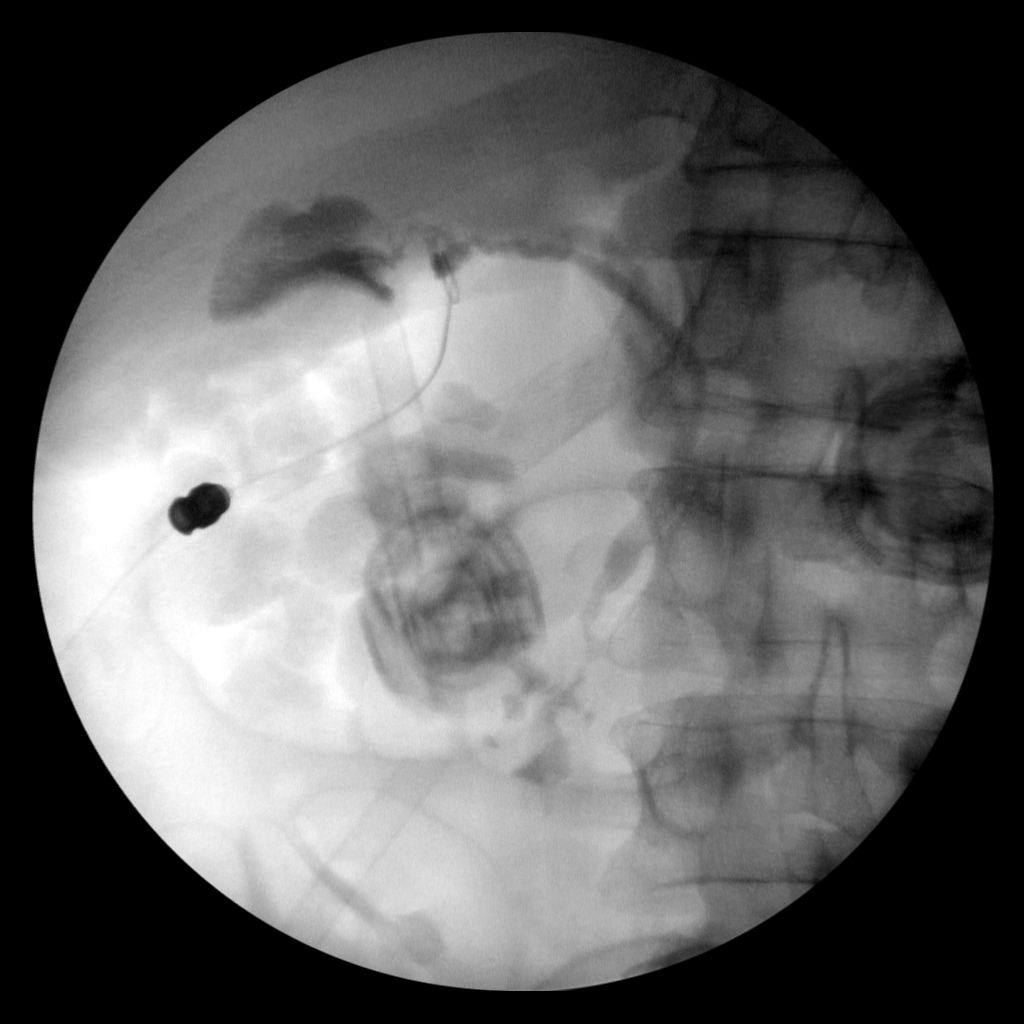
[frame 81/95]
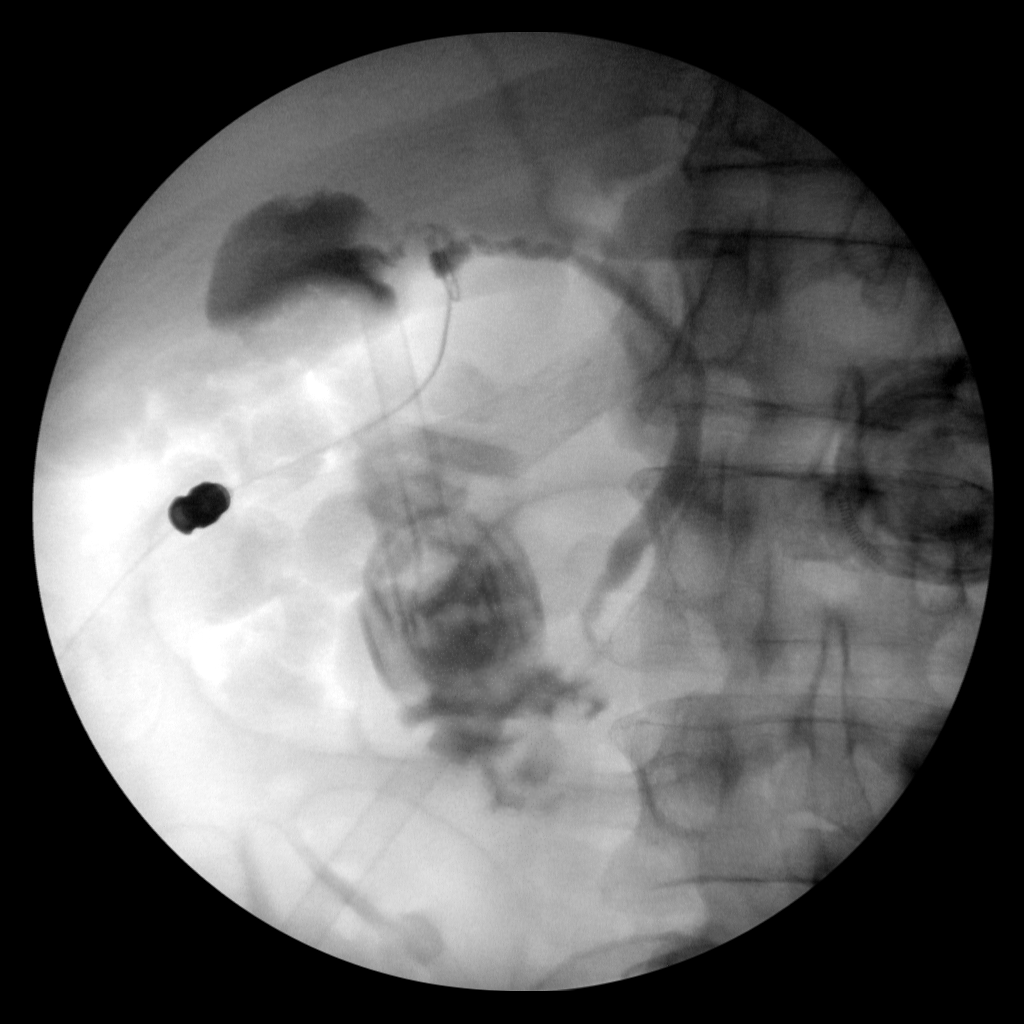
[frame 91/95]
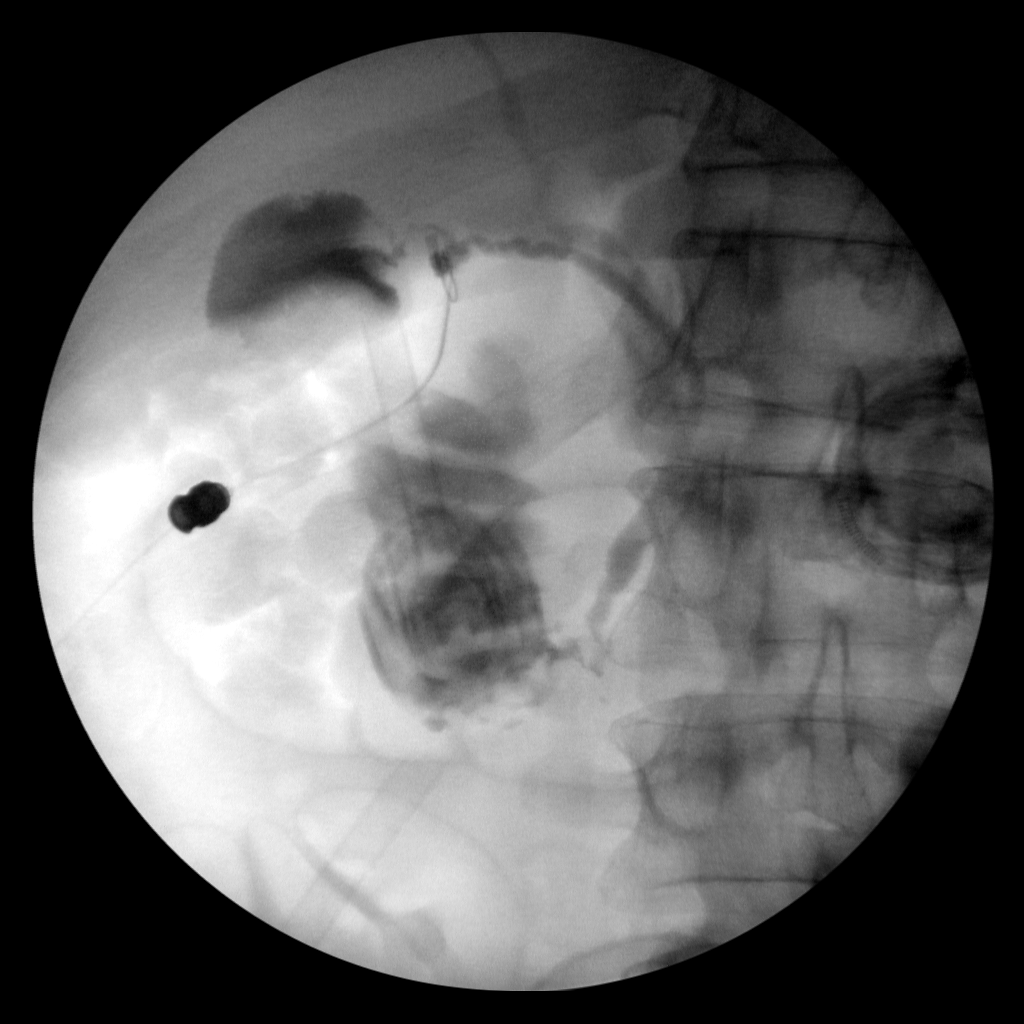

[4 of 4 positions shown; findings below may reference images not displayed]

FINDINGS: Cystic duct has a low insertion on the common bile duct, a normal
variant. Common hepatic duct, cystic duct and common bile duct are
all patent. No dilatation, obstruction, or filling defect. Contrast
drains into the duodenum.

Contrast is also noted to reflux into the the gallbladder.
IMPRESSION: Patent biliary system.

## 2014-01-30 SURGERY — LAPAROSCOPIC CHOLECYSTECTOMY WITH INTRAOPERATIVE CHOLANGIOGRAM
Anesthesia: General

## 2014-01-30 MED ORDER — ROCURONIUM BROMIDE 100 MG/10ML IV SOLN
INTRAVENOUS | Status: AC
  Filled 2014-01-30: qty 1

## 2014-01-30 MED ORDER — ONDANSETRON HCL 4 MG/2ML IJ SOLN
INTRAMUSCULAR | Status: AC
  Filled 2014-01-30: qty 2

## 2014-01-30 MED ORDER — BUPIVACAINE LIPOSOME 1.3 % IJ SUSP
20.0000 mL | Freq: Once | INTRAMUSCULAR | Status: AC
  Administered 2014-01-30: 20 mL
  Filled 2014-01-30: qty 20

## 2014-01-30 MED ORDER — DEXAMETHASONE SODIUM PHOSPHATE 10 MG/ML IJ SOLN
INTRAMUSCULAR | Status: DC | PRN
  Administered 2014-01-30: 10 mg via INTRAVENOUS

## 2014-01-30 MED ORDER — LIDOCAINE HCL (CARDIAC) 20 MG/ML IV SOLN
INTRAVENOUS | Status: AC
  Filled 2014-01-30: qty 5

## 2014-01-30 MED ORDER — LOSARTAN POTASSIUM 50 MG PO TABS
100.0000 mg | ORAL_TABLET | Freq: Every day | ORAL | Status: DC
  Administered 2014-01-31: 100 mg via ORAL
  Filled 2014-01-30 (×2): qty 2

## 2014-01-30 MED ORDER — NEOSTIGMINE METHYLSULFATE 10 MG/10ML IV SOLN
INTRAVENOUS | Status: DC | PRN
  Administered 2014-01-30: 4 mg via INTRAVENOUS

## 2014-01-30 MED ORDER — ALPRAZOLAM 0.25 MG PO TABS: 0.2500 mg | ORAL_TABLET | Freq: Every evening | ORAL | Status: DC | PRN

## 2014-01-30 MED ORDER — LIDOCAINE HCL (CARDIAC) 20 MG/ML IV SOLN
INTRAVENOUS | Status: DC | PRN
  Administered 2014-01-30: 50 mg via INTRAVENOUS

## 2014-01-30 MED ORDER — ONDANSETRON HCL 4 MG/2ML IJ SOLN
INTRAMUSCULAR | Status: DC | PRN
  Administered 2014-01-30: 4 mg via INTRAVENOUS

## 2014-01-30 MED ORDER — TRAMADOL HCL 50 MG PO TABS
50.0000 mg | ORAL_TABLET | ORAL | Status: DC | PRN
  Administered 2014-01-30 – 2014-01-31 (×4): 50 mg via ORAL
  Filled 2014-01-30 (×5): qty 1

## 2014-01-30 MED ORDER — ALBUTEROL SULFATE HFA 108 (90 BASE) MCG/ACT IN AERS: 1.0000 | INHALATION_SPRAY | Freq: Four times a day (QID) | RESPIRATORY_TRACT | Status: DC | PRN

## 2014-01-30 MED ORDER — 0.9 % SODIUM CHLORIDE (POUR BTL) OPTIME
TOPICAL | Status: DC | PRN
  Administered 2014-01-30: 1000 mL

## 2014-01-30 MED ORDER — ACETAMINOPHEN 10 MG/ML IV SOLN
1000.0000 mg | Freq: Once | INTRAVENOUS | Status: AC
  Administered 2014-01-30: 1000 mg via INTRAVENOUS
  Filled 2014-01-30: qty 100

## 2014-01-30 MED ORDER — FENTANYL CITRATE 0.05 MG/ML IJ SOLN
12.5000 ug | INTRAMUSCULAR | Status: DC | PRN
Start: 2014-01-30 — End: 2014-01-31

## 2014-01-30 MED ORDER — ROCURONIUM BROMIDE 100 MG/10ML IV SOLN
INTRAVENOUS | Status: DC | PRN
  Administered 2014-01-30: 20 mg via INTRAVENOUS
  Administered 2014-01-30: 10 mg via INTRAVENOUS
  Administered 2014-01-30: 40 mg via INTRAVENOUS

## 2014-01-30 MED ORDER — HEPARIN SODIUM (PORCINE) 5000 UNIT/ML IJ SOLN
5000.0000 [IU] | Freq: Three times a day (TID) | INTRAMUSCULAR | Status: DC
  Administered 2014-01-31: 5000 [IU] via SUBCUTANEOUS
  Filled 2014-01-30 (×4): qty 1

## 2014-01-30 MED ORDER — MIDAZOLAM HCL 2 MG/2ML IJ SOLN
INTRAMUSCULAR | Status: AC
  Filled 2014-01-30: qty 2

## 2014-01-30 MED ORDER — LACTATED RINGERS IV SOLN: INTRAVENOUS | Status: DC

## 2014-01-30 MED ORDER — ALBUTEROL SULFATE HFA 108 (90 BASE) MCG/ACT IN AERS
INHALATION_SPRAY | RESPIRATORY_TRACT | Status: DC | PRN
  Administered 2014-01-30 (×2): 2 via RESPIRATORY_TRACT

## 2014-01-30 MED ORDER — FENTANYL CITRATE 0.05 MG/ML IJ SOLN
25.0000 ug | INTRAMUSCULAR | Status: DC | PRN
  Administered 2014-01-30 (×4): 50 ug via INTRAVENOUS

## 2014-01-30 MED ORDER — DEXAMETHASONE SODIUM PHOSPHATE 10 MG/ML IJ SOLN
INTRAMUSCULAR | Status: AC
  Filled 2014-01-30: qty 1

## 2014-01-30 MED ORDER — MIDAZOLAM HCL 5 MG/5ML IJ SOLN
INTRAMUSCULAR | Status: DC | PRN
  Administered 2014-01-30: 2 mg via INTRAVENOUS

## 2014-01-30 MED ORDER — ALBUTEROL SULFATE HFA 108 (90 BASE) MCG/ACT IN AERS
INHALATION_SPRAY | RESPIRATORY_TRACT | Status: AC
  Filled 2014-01-30: qty 6.7

## 2014-01-30 MED ORDER — KETOROLAC TROMETHAMINE 30 MG/ML IJ SOLN
15.0000 mg | Freq: Once | INTRAMUSCULAR | Status: AC | PRN
  Administered 2014-01-30: 30 mg via INTRAVENOUS

## 2014-01-30 MED ORDER — PROPOFOL 10 MG/ML IV BOLUS
INTRAVENOUS | Status: DC | PRN
  Administered 2014-01-30: 50 mg via INTRAVENOUS
  Administered 2014-01-30: 150 mg via INTRAVENOUS

## 2014-01-30 MED ORDER — IOHEXOL 300 MG/ML  SOLN
INTRAMUSCULAR | Status: DC | PRN
  Administered 2014-01-30: 3 mL via INTRAVENOUS

## 2014-01-30 MED ORDER — FENTANYL CITRATE 0.05 MG/ML IJ SOLN
INTRAMUSCULAR | Status: AC
  Filled 2014-01-30: qty 5

## 2014-01-30 MED ORDER — GLYCOPYRROLATE 0.2 MG/ML IJ SOLN
INTRAMUSCULAR | Status: DC | PRN
  Administered 2014-01-30: .6 mg via INTRAVENOUS

## 2014-01-30 MED ORDER — FENTANYL CITRATE 0.05 MG/ML IJ SOLN
INTRAMUSCULAR | Status: DC | PRN
  Administered 2014-01-30 (×2): 50 ug via INTRAVENOUS
  Administered 2014-01-30 (×2): 100 ug via INTRAVENOUS
  Administered 2014-01-30: 50 ug via INTRAVENOUS

## 2014-01-30 MED ORDER — PROPOFOL 10 MG/ML IV BOLUS
INTRAVENOUS | Status: AC
  Filled 2014-01-30: qty 20

## 2014-01-30 MED ORDER — LACTATED RINGERS IV SOLN
INTRAVENOUS | Status: DC | PRN
  Administered 2014-01-30: 10:00:00 via INTRAVENOUS

## 2014-01-30 MED ORDER — KETOROLAC TROMETHAMINE 30 MG/ML IJ SOLN
INTRAMUSCULAR | Status: AC
Start: 2014-01-30 — End: 2014-01-30
  Filled 2014-01-30: qty 1

## 2014-01-30 MED ORDER — HYDROMORPHONE HCL 1 MG/ML IJ SOLN: 0.2500 mg | INTRAMUSCULAR | Status: DC | PRN

## 2014-01-30 MED ORDER — FENTANYL CITRATE 0.05 MG/ML IJ SOLN
INTRAMUSCULAR | Status: AC
  Filled 2014-01-30: qty 2

## 2014-01-30 MED ORDER — GLYCOPYRROLATE 0.2 MG/ML IJ SOLN
INTRAMUSCULAR | Status: AC
  Filled 2014-01-30: qty 3

## 2014-01-30 MED ORDER — FENTANYL CITRATE 0.05 MG/ML IJ SOLN
INTRAMUSCULAR | Status: AC
Start: 2014-01-30 — End: 2014-01-30
  Filled 2014-01-30: qty 2

## 2014-01-30 MED ORDER — LACTATED RINGERS IR SOLN
Status: DC | PRN
  Administered 2014-01-30: 1000 mL

## 2014-01-30 MED ORDER — KCL IN DEXTROSE-NACL 20-5-0.45 MEQ/L-%-% IV SOLN
INTRAVENOUS | Status: DC
  Administered 2014-01-30: 13:00:00 via INTRAVENOUS
  Administered 2014-01-30 – 2014-01-31 (×2): 100 mL via INTRAVENOUS
  Filled 2014-01-30 (×4): qty 1000

## 2014-01-30 SURGICAL SUPPLY — 40 items
ADH SKN CLS APL DERMABOND .7 (GAUZE/BANDAGES/DRESSINGS) ×1
APL SKNCLS STERI-STRIP NONHPOA (GAUZE/BANDAGES/DRESSINGS)
APPLIER CLIP ROT 10 11.4 M/L (STAPLE) ×3
APR CLP MED LRG 11.4X10 (STAPLE) ×1
BAG SPEC RTRVL 10 TROC 200 (ENDOMECHANICALS)
BAG SPEC RTRVL LRG 6X4 10 (ENDOMECHANICALS) ×1
BENZOIN TINCTURE PRP APPL 2/3 (GAUZE/BANDAGES/DRESSINGS) IMPLANT
CABLE HIGH FREQUENCY MONO STRZ (ELECTRODE) IMPLANT
CATH REDDICK CHOLANGI 4FR 50CM (CATHETERS) ×3 IMPLANT
CLIP APPLIE ROT 10 11.4 M/L (STAPLE) ×1 IMPLANT
CLOSURE WOUND 1/2 X4 (GAUZE/BANDAGES/DRESSINGS)
COVER MAYO STAND STRL (DRAPES) ×3 IMPLANT
DECANTER SPIKE VIAL GLASS SM (MISCELLANEOUS) ×3 IMPLANT
DERMABOND ADVANCED (GAUZE/BANDAGES/DRESSINGS) ×2
DERMABOND ADVANCED .7 DNX12 (GAUZE/BANDAGES/DRESSINGS) IMPLANT
DRAPE C-ARM 42X120 X-RAY (DRAPES) ×3 IMPLANT
DRAPE LAPAROSCOPIC ABDOMINAL (DRAPES) ×3 IMPLANT
ELECT REM PT RETURN 9FT ADLT (ELECTROSURGICAL) ×3
ELECTRODE REM PT RTRN 9FT ADLT (ELECTROSURGICAL) ×1 IMPLANT
GLOVE BIOGEL M 8.0 STRL (GLOVE) ×3 IMPLANT
GOWN STRL REUS W/TWL XL LVL3 (GOWN DISPOSABLE) ×12 IMPLANT
HEMOSTAT SURGICEL 4X8 (HEMOSTASIS) IMPLANT
IV CATH 14GX2 1/4 (CATHETERS) ×3 IMPLANT
KIT BASIN OR (CUSTOM PROCEDURE TRAY) ×3 IMPLANT
LIQUID BAND (GAUZE/BANDAGES/DRESSINGS) ×3 IMPLANT
POUCH RETRIEVAL ECOSAC 10 (ENDOMECHANICALS) IMPLANT
POUCH RETRIEVAL ECOSAC 10MM (ENDOMECHANICALS)
POUCH SPECIMEN RETRIEVAL 10MM (ENDOMECHANICALS) ×2 IMPLANT
SCISSORS LAP 5X45 EPIX DISP (ENDOMECHANICALS) ×3 IMPLANT
SCRUB PCMX 4 OZ (MISCELLANEOUS) ×3 IMPLANT
SET IRRIG TUBING LAPAROSCOPIC (IRRIGATION / IRRIGATOR) ×3 IMPLANT
SLEEVE XCEL OPT CAN 5 100 (ENDOMECHANICALS) ×6 IMPLANT
STRIP CLOSURE SKIN 1/2X4 (GAUZE/BANDAGES/DRESSINGS) IMPLANT
SUT VIC AB 4-0 SH 18 (SUTURE) ×5 IMPLANT
SYR 20CC LL (SYRINGE) ×3 IMPLANT
TOWEL OR 17X26 10 PK STRL BLUE (TOWEL DISPOSABLE) ×3 IMPLANT
TRAY LAPAROSCOPIC (CUSTOM PROCEDURE TRAY) ×3 IMPLANT
TROCAR BLADELESS OPT 5 100 (ENDOMECHANICALS) ×3 IMPLANT
TROCAR XCEL BLUNT TIP 100MML (ENDOMECHANICALS) IMPLANT
TROCAR XCEL NON-BLD 11X100MML (ENDOMECHANICALS) ×3 IMPLANT

## 2014-01-30 NOTE — Progress Notes (Signed)
Per pt and wife... Pt became very SOB: "His asthmalike attack" after taking i.v. Dilaudid" Respitory theraptist was paged and treatment was given and pt states he feels ok. Wife does not want nursing staff to give him dilaudid. Will notify attending physician through note to address.

## 2014-01-30 NOTE — Anesthesia Preprocedure Evaluation (Addendum)
Anesthesia Evaluation  Patient identified by MRN, date of birth, ID band Patient awake    Reviewed: Allergy & Precautions, H&P , NPO status , Patient's Chart, lab work & pertinent test results  Airway Mallampati: II  TM Distance: >3 FB Neck ROM: Full    Dental no notable dental hx.    Pulmonary neg pulmonary ROS,  breath sounds clear to auscultation  Pulmonary exam normal       Cardiovascular hypertension, Pt. on medications Rhythm:Regular Rate:Normal     Neuro/Psych negative neurological ROS  negative psych ROS   GI/Hepatic negative GI ROS, Neg liver ROS,   Endo/Other  negative endocrine ROS  Renal/GU negative Renal ROS  negative genitourinary   Musculoskeletal negative musculoskeletal ROS (+)   Abdominal   Peds negative pediatric ROS (+)  Hematology negative hematology ROS (+)   Anesthesia Other Findings   Reproductive/Obstetrics negative OB ROS                            Anesthesia Physical Anesthesia Plan  ASA: II  Anesthesia Plan: General   Post-op Pain Management:    Induction: Intravenous  Airway Management Planned: Oral ETT  Additional Equipment:   Intra-op Plan:   Post-operative Plan: Extubation in OR  Informed Consent: I have reviewed the patients History and Physical, chart, labs and discussed the procedure including the risks, benefits and alternatives for the proposed anesthesia with the patient or authorized representative who has indicated his/her understanding and acceptance.   Dental advisory given  Plan Discussed with: CRNA and Surgeon  Anesthesia Plan Comments:        Anesthesia Quick Evaluation

## 2014-01-30 NOTE — Transfer of Care (Signed)
Immediate Anesthesia Transfer of Care Note  Patient: Edwin Brown  Procedure(s) Performed: Procedure(s) (LRB): LAPAROSCOPIC CHOLECYSTECTOMY WITH INTRAOPERATIVE CHOLANGIOGRAM (N/A)  Patient Location: PACU  Anesthesia Type: General  Level of Consciousness: sedated, patient cooperative and responds to stimulation  Airway & Oxygen Therapy: Patient Spontanous Breathing and Patient connected to face mask oxgen  Post-op Assessment: Report given to PACU RN and Post -op Vital signs reviewed and stable  Post vital signs: Reviewed and stable  Complications: No apparent anesthesia complications

## 2014-01-30 NOTE — Progress Notes (Signed)
Nutrition Brief Note  Patient identified on the Malnutrition Screening Tool (MST) Report  Wt Readings from Last 15 Encounters:  12/12/12 178 lb (80.74 kg)    There is no weight on file to calculate BMI.   Current diet order is CL, patient is consuming approximately >50% of meals at this time. Labs and medications reviewed.   Pt reported unintentional wt loss of 7 lbs in past one month (3.9% body weight loss, non-severe for time frame). Has decreased appetite d/t feelings of early satiety for past several weeks. Pt is s/p lap chole, and is tolerating clear liquid diet w/out abd pain or nausea; eager for diet advancement. Recommended pt avoid high fat/greasy foods post op for improved recovery. Pt verbalized understanding, and noted that he has followed general healthy diet prior to admit. Recommended to utilize nutrition supplements as warranted while appetite returns to baseline   No additional nutrition interventions warranted at this time. If nutrition issues arise, please consult RD.   Atlee Abide MS RD LDN Clinical Dietitian [phone]

## 2014-01-30 NOTE — Op Note (Signed)
Garey @date @  Procedure: Laparoscopic Cholecystectomy with intraoperative cholangiogram  Surgeon: Kaylyn Lim, MD, FACS Asst:  none  Anes:  General  Drains: None  Findings: Minimal chronic cholecystitis; nl IOC  Description of Procedure: The patient was taken to OR 6 and given general anesthesia.  The patient was prepped with PCMX and draped sterilely. A time out was performed.  Access to the abdomen was achieved with 5 mm Optiview through the right upper quadrant.  Port placement included three 5 mm trocars and one 11 mm trocar in the upper midline.    The gallbladder was visualized and the fundus was grasped and the gallbladder was elevated. Traction on the infundibulum allowed for successful demonstration of the critical view. Inflammatory changes were confined to a few old adhesions to the fundus.  The cystic duct was identified and clipped up on the gallbladder and an incision was made in the cystic duct and the Reddick catheter was inserted after milking the cystic duct of any debris. A dynamic cholangiogram was performed which demonstrated small CBD and intrahepatic duct anatomy.  Some reflux of contrast up in to the gallbladder.  .    The cystic duct was then triple clipped and divided, the cystic artery was double clipped and divided and then the gallbladder was removed from the gallbladder bed. Removal of the gallbladder from the gallbladder bed was accomplished without entering it.  The gallbladder was then placed in a bag and brought out through the  11 mm trocar sites. The gallbladder bed was inspected and no bleeding or bile leaks were seen.   Laparoscopic visualization was used when closing fascial defects for trocar sites.   Incisions were injected with Exparel and closed with 4-0 Vicryl and Dermabond on the skin.  Sponge and needle count were correct.    The patient was taken to the recovery room in satisfactory condition.

## 2014-01-30 NOTE — Anesthesia Postprocedure Evaluation (Signed)
  Anesthesia Post-op Note  Patient: Edwin Brown  Procedure(s) Performed: Procedure(s) (LRB): LAPAROSCOPIC CHOLECYSTECTOMY WITH INTRAOPERATIVE CHOLANGIOGRAM (N/A)  Patient Location: PACU  Anesthesia Type: General  Level of Consciousness: awake and alert   Airway and Oxygen Therapy: Patient Spontanous Breathing  Post-op Pain: mild  Post-op Assessment: Post-op Vital signs reviewed, Patient's Cardiovascular Status Stable, Respiratory Function Stable, Patent Airway and No signs of Nausea or vomiting  Last Vitals:  Filed Vitals:   01/30/14 1145  BP: 168/97  Pulse: 66  Temp:   Resp: 13    Post-op Vital Signs: stable   Complications: No apparent anesthesia complications

## 2014-01-30 NOTE — Discharge Instructions (Signed)
Laparoscopic Cholecystectomy, Care After °Refer to this sheet in the next few weeks. These instructions provide you with information on caring for yourself after your procedure. Your health care provider may also give you more specific instructions. Your treatment has been planned according to current medical practices, but problems sometimes occur. Call your health care provider if you have any problems or questions after your procedure. °WHAT TO EXPECT AFTER THE PROCEDURE °After your procedure, it is typical to have the following: °· Pain at your incision sites. You will be given pain medicines to control the pain. °· Mild nausea or vomiting. This should improve after the first 24 hours. °· Bloating and possibly shoulder pain from the gas used during the procedure. This will improve after the first 24 hours. °HOME CARE INSTRUCTIONS  °· Change bandages (dressings) as directed by your health care provider. °· Keep the wound dry and clean. You may wash the wound gently with soap and water. Gently blot or dab the area dry. °· Do not take baths or use swimming pools or hot tubs for 2 weeks or until your health care provider approves. °· Only take over-the-counter or prescription medicines as directed by your health care provider. °· Continue your normal diet as directed by your health care provider. °· Do not lift anything heavier than 10 pounds (4.5 kg) until your health care provider approves. °· Do not play contact sports for 1 week or until your health care provider approves. °SEEK MEDICAL CARE IF:  °· You have redness, swelling, or increasing pain in the wound. °· You notice yellowish-white fluid (pus) coming from the wound. °· You have drainage from the wound that lasts longer than 1 day. °· You notice a bad smell coming from the wound or dressing. °· Your surgical cuts (incisions) break open. °SEEK IMMEDIATE MEDICAL CARE IF:  °· You develop a rash. °· You have difficulty breathing. °· You have chest pain. °· You  have a fever. °· You have increasing pain in the shoulders (shoulder strap areas). °· You have dizzy episodes or faint while standing. °· You have severe abdominal pain. °· You feel sick to your stomach (nauseous) or throw up (vomit) and this lasts for more than 1 day. °Document Released: 02/27/2005 Document Revised: 12/18/2012 Document Reviewed: 10/09/2012 °ExitCare® Patient Information ©2015 [employer]. This information is not intended to replace advice given to you by your health care provider. Make sure you discuss any questions you have with your health care provider. ° °CCS ______[facility] °LAPAROSCOPIC SURGERY: POST OP INSTRUCTIONS °Always review your discharge instruction sheet given to you by the facility where your surgery was performed. °IF YOU HAVE DISABILITY OR FAMILY LEAVE FORMS, YOU MUST BRING THEM TO THE OFFICE FOR PROCESSING.   °DO NOT GIVE THEM TO YOUR DOCTOR. ° °1. A prescription for pain medication may be given to you upon discharge.  Take your pain medication as prescribed, if needed.  If narcotic pain medicine is not needed, then you may take acetaminophen (Tylenol) or ibuprofen (Advil) as needed. °2. Take your usually prescribed medications unless otherwise directed. °3. If you need a refill on your pain medication, please contact your pharmacy.  They will contact our office to request authorization. Prescriptions will not be filled after 5pm or on week-ends. °4. You should follow a light diet the first few days after arrival home, such as soup and crackers, etc.  Be sure to include lots of fluids daily. °5. Most patients will experience some   swelling and bruising in the area of the incisions.  Ice packs will help.  Swelling and bruising can take several days to resolve.  °6. It is common to experience some constipation if taking pain medication after surgery.  Increasing fluid intake and taking a stool softener (such as Colace) will usually help or prevent this problem  from occurring.  A mild laxative (Milk of Magnesia or Miralax) should be taken according to package instructions if there are no bowel movements after 48 hours. °7. Unless discharge instructions indicate otherwise, you may remove your bandages 24-48 hours after surgery, and you may shower at that time.  You may have steri-strips (small skin tapes) in place directly over the incision.  These strips should be left on the skin for 7-10 days.  If your surgeon used skin glue on the incision, you may shower in 24 hours.  The glue will flake off over the next 2-3 weeks.  Any sutures or staples will be removed at the office during your follow-up visit. °8. ACTIVITIES:  You may resume regular (light) daily activities beginning the next day--such as daily self-care, walking, climbing stairs--gradually increasing activities as tolerated.  You may have sexual intercourse when it is comfortable.  Refrain from any heavy lifting or straining until approved by your doctor. °a. You may drive when you are no longer taking prescription pain medication, you can comfortably wear a seatbelt, and you can safely maneuver your car and apply brakes. °b. RETURN TO WORK:  __________________________________________________________ °9. You should see your doctor in the office for a follow-up appointment approximately 2-3 weeks after your surgery.  Make sure that you call for this appointment within a day or two after you arrive home to insure a convenient appointment time. °10. OTHER INSTRUCTIONS: __________________________________________________________________________________________________________________________ __________________________________________________________________________________________________________________________ °WHEN TO CALL YOUR DOCTOR: °1. Fever over 101.0 °2. Inability to urinate °3. Continued bleeding from incision. °4. Increased pain, redness, or drainage from the incision. °5. Increasing abdominal pain ° °The  clinic staff is available to answer your questions during regular business hours.  Please don’t hesitate to call and ask to speak to one of the nurses for clinical concerns.  If you have a medical emergency, go to the nearest emergency room or call 911.  A surgeon from [facility] is always on call at the hospital. °[address], [address], [place], [place]  [address] ? [address] [address], [place], [place]   [address] °[phone] ? [phone] ? FAX [phone] °Web site: [email or web address] °

## 2014-01-31 LAB — COMPREHENSIVE METABOLIC PANEL
ALT: 22 U/L (ref 0–53)
ANION GAP: 9 (ref 5–15)
AST: 22 U/L (ref 0–37)
Albumin: 3.5 g/dL (ref 3.5–5.2)
Alkaline Phosphatase: 43 U/L (ref 39–117)
BUN: 8 mg/dL (ref 6–23)
CO2: 26 mEq/L (ref 19–32)
Calcium: 9.7 mg/dL (ref 8.4–10.5)
Chloride: 105 mEq/L (ref 96–112)
Creatinine, Ser: 0.81 mg/dL (ref 0.50–1.35)
GFR calc non Af Amer: >90 mL/min (ref >90)
Glucose, Bld: 163 mg/dL — ABNORMAL HIGH (ref 70–99)
POTASSIUM: 4.4 meq/L (ref 3.7–5.3)
Sodium: 140 mEq/L (ref 137–147)
TOTAL PROTEIN: 6.2 g/dL (ref 6.0–8.3)
Total Bilirubin: 0.2 mg/dL — ABNORMAL LOW (ref 0.3–1.2)

## 2014-01-31 LAB — CBC
HEMATOCRIT: 37.5 % — AB (ref 39.0–52.0)
HEMOGLOBIN: 13 g/dL (ref 13.0–17.0)
MCH: 29.7 pg (ref 26.0–34.0)
MCHC: 34.7 g/dL (ref 30.0–36.0)
MCV: 85.6 fL (ref 78.0–100.0)
Platelets: 249 10*3/uL (ref 150–400)
RBC: 4.38 MIL/uL (ref 4.22–5.81)
RDW: 12.5 % (ref 11.5–15.5)
WBC: 9.7 10*3/uL (ref 4.0–10.5)

## 2014-01-31 MED ORDER — TRAMADOL HCL 50 MG PO TABS: 50.0000 mg | ORAL_TABLET | Freq: Four times a day (QID) | ORAL | Status: DC | PRN

## 2014-01-31 NOTE — Plan of Care (Signed)
Problem: Phase I Progression Outcomes Goal: OOB as tolerated unless otherwise ordered Outcome: Completed/Met Date Met:  01/31/14 Goal: Incision/dressings dry and intact Outcome: Completed/Met Date Met:  01/31/14 Goal: Sutures/staples intact Outcome: Not Applicable Date Met:  74/25/95 Goal: Tubes/drains patent Outcome: Not Applicable Date Met:  63/87/56 Goal: Voiding-avoid urinary catheter unless indicated Outcome: Completed/Met Date Met:  01/31/14 Goal: Vital signs/hemodynamically stable Outcome: Completed/Met Date Met:  01/31/14

## 2014-01-31 NOTE — Progress Notes (Signed)
Reviewed discharge instructions with patient and spouse (who is a Marine scientist) including follow up appointment, incision care, and medications.  Pt/spouse verbalized understanding of all topics discussed without questions.  Pt being d/c into care of spouse.

## 2014-01-31 NOTE — Progress Notes (Signed)
Utilization Review completed.  

## 2014-01-31 NOTE — Plan of Care (Signed)
Problem: Phase I Progression Outcomes Goal: Pain controlled with appropriate interventions Outcome: Completed/Met Date Met:  01/31/14     

## 2014-01-31 NOTE — Discharge Summary (Signed)
Physician Discharge Summary [facility]  Patient ID: Edwin Brown MRN: [id number] DOB/AGE: 1955/05/05 58 y.o.  Admit date: 01/29/2014 Discharge date: 01/31/2014  Admission Diagnoses:  Abdominal pain, biliary dyskinesia  Discharge Diagnoses:  Active Problems:   S/P laparoscopic cholecystectomy Nov 2015   Biliary dyskinesia   Discharged Condition: good  Hospital Course: Patient was admitted for observation following gallbladder surgery.  Post op course was uncomplicated.  Pain was well controlled.  Tolerated diet.  Patient was prepared for discharge home on POD#1.  Consults: None  Treatments: surgery: lap chole with IOC  Discharge Exam: Blood pressure 134/70, pulse 88, temperature 98 F (36.7 C), temperature source Oral, resp. rate 16, height 5\' 8"  (1.727 m), weight 172 lb (78.019 kg), SpO2 97 %. HEENT - clear Neck - soft Chest - clear bilaterally Cor - RRR Abd - soft without distension; wounds dry and intact with Dermabond  Disposition: Home  Discharge Instructions    Diet - low sodium heart healthy    Complete by:  As directed      Discharge instructions    Complete by:  As directed   [facility]  LAPAROSCOPIC SURGERY - POST-OP INSTRUCTIONS  Always review your discharge instruction sheet given to you by the facility where your surgery was performed.  A prescription for pain medication may be given to you upon discharge.  Take your pain medication as prescribed.  If narcotic pain medicine is not needed, then you may take acetaminophen (Tylenol) or ibuprofen (Advil) as needed.  Take your usually prescribed medications unless otherwise directed.  If you need a refill on your pain medication, please contact your pharmacy.  They will contact our office to request authorization. Prescriptions will not be filled after 5 P.M. or on weekends.  You should follow a light diet the first few days after arrival home, such as soup and  crackers or toast.  Be sure to include plenty of fluids daily.  Most patients will experience some swelling and bruising in the area of the incisions.  Ice packs will help.  Swelling and bruising can take several days to resolve.   It is common to experience some constipation if taking pain medication after surgery.  Increasing fluid intake and taking a stool softener (such as Colace) will usually help or prevent this problem from occurring.  A mild laxative (Milk of Magnesia or Miralax) should be taken according to package instructions if there are no bowel movements after 48 hours.  Unless discharge instructions indicate otherwise, you may remove your bandages 24-48 hours after surgery, and you may shower at that time.  You may have steri-strips (small skin tapes) in place directly over the incision.  These strips should be left on the skin for 7-10 days.  If your surgeon used skin glue on the incision, you may shower in 24 hours.  The glue will flake off over the next 2-3 weeks.  Any sutures or staples will be removed at the office during your follow-up visit.  ACTIVITIES:  You may resume regular (light) daily activities beginning the next day-such as daily self-care, walking, climbing stairs-gradually increasing activities as tolerated.  You may have sexual intercourse when it is comfortable.  Refrain from any heavy lifting or straining until approved by your doctor.  You may drive when you are no longer taking prescription pain medication, you can comfortably wear a seatbelt, and you can safely maneuver your car and apply brakes.  You should see your  doctor in the office for a follow-up appointment approximately 2-3 weeks after your surgery.  Make sure that you call for this appointment within a day or two after you arrive home to insure a convenient appointment time.  WHEN TO CALL YOUR DOCTOR: Fever over 101.0 Inability to urinate Continued bleeding from incision Increased pain, redness, or  drainage from the incision Increasing abdominal pain  The clinic staff is available to answer your questions during regular business hours.  Please don't hesitate to call and ask to speak to one of the nurses for clinical concerns.  If you have a medical emergency, go to the nearest emergency room or call 911.  A surgeon from [facility] is always on call for the hospital.  Edwin Regal, MD, [facility] Office: [place] Free:  [phone] FAX [phone]  Web site: [email or web address]     Increase activity slowly    Complete by:  As directed      No dressing needed    Complete by:  As directed             Medication List    TAKE these medications        albuterol 108 (90 BASE) MCG/ACT inhaler  Commonly known as:  PROVENTIL HFA;VENTOLIN HFA  Inhale 1 puff into the lungs every 6 (six) hours as needed for wheezing or shortness of breath.     ALPRAZolam 0.25 MG tablet  Commonly known as:  XANAX  Take 0.25 mg by mouth at bedtime as needed for anxiety.     clarithromycin 500 MG tablet  Commonly known as:  BIAXIN  Take 500 mg by mouth 2 (two) times daily. For 10 days     GLUCOSAMINE MSM COMPLEX PO  Take 2 capsules by mouth daily.     Influenza Vac Split Quad 0.25 ML injection  Commonly known as:  FLUZONE  Inject 0.25 mLs into the muscle once.     loratadine 10 MG tablet  Commonly known as:  CLARITIN  Take 10 mg by mouth daily as needed for allergies.     losartan 100 MG tablet  Commonly known as:  COZAAR  Take 100 mg by mouth daily.     traMADol 50 MG tablet  Commonly known as:  ULTRAM  Take 50 mg by mouth every 4 (four) hours as needed for moderate pain.     traMADol 50 MG tablet  Commonly known as:  ULTRAM  Take 1 tablet (50 mg total) by mouth every 6 (six) hours as needed for moderate pain.     Turmeric Curcumin 500 MG Caps  Take 1,000 mg by mouth daily.           Follow-up Information     Follow up with [facility] On 02/24/2014.   Why:  Your appointment is at 2:15 PM, be at the office for check in at 1:45 PM   Contact information:   [place] [address] [phone]       Edwin Regal, MD, [facility] Office: [phone]   Signed: Earnstine Brown 01/31/2014, 11:05 AM

## 2014-02-02 ENCOUNTER — Encounter (HOSPITAL_COMMUNITY): Payer: Self-pay | Admitting: Surgery

## 2015-04-12 MED FILL — ALPRAZolam 0.25 MG TABS: 0.25 | 30 days supply | Qty: 30 | Fill #0

## 2015-06-23 DIAGNOSIS — F329 Major depressive disorder, single episode, unspecified: Secondary | ICD-10-CM | POA: Diagnosis not present

## 2015-06-23 DIAGNOSIS — J45909 Unspecified asthma, uncomplicated: Secondary | ICD-10-CM | POA: Diagnosis not present

## 2015-06-23 DIAGNOSIS — Z125 Encounter for screening for malignant neoplasm of prostate: Secondary | ICD-10-CM | POA: Diagnosis not present

## 2015-06-23 DIAGNOSIS — I10 Essential (primary) hypertension: Secondary | ICD-10-CM | POA: Diagnosis not present

## 2015-06-23 DIAGNOSIS — Z0000 Encounter for general adult medical examination without abnormal findings: Secondary | ICD-10-CM | POA: Diagnosis not present

## 2015-06-23 DIAGNOSIS — J309 Allergic rhinitis, unspecified: Secondary | ICD-10-CM | POA: Diagnosis not present

## 2015-06-23 DIAGNOSIS — Z23 Encounter for immunization: Secondary | ICD-10-CM | POA: Diagnosis not present

## 2015-06-23 DIAGNOSIS — K219 Gastro-esophageal reflux disease without esophagitis: Secondary | ICD-10-CM | POA: Diagnosis not present

## 2015-06-23 MED FILL — ALPRAZolam 0.25 MG TABS: 0.25 | 30 days supply | Qty: 30 | Fill #0

## 2015-07-26 MED FILL — ALPRAZolam 0.25 MG TABS: 0.25 | 30 days supply | Qty: 30 | Fill #0

## 2015-08-30 MED FILL — ALPRAZolam 0.25 MG TABS: 0.25 | 30 days supply | Qty: 30 | Fill #0

## 2015-09-21 ENCOUNTER — Ambulatory Visit (INDEPENDENT_AMBULATORY_CARE_PROVIDER_SITE_OTHER): Payer: 59 | Admitting: Family Medicine

## 2015-09-21 ENCOUNTER — Encounter: Payer: Self-pay | Admitting: Family Medicine

## 2015-09-21 ENCOUNTER — Ambulatory Visit (INDEPENDENT_AMBULATORY_CARE_PROVIDER_SITE_OTHER)
Admission: RE | Admit: 2015-09-21 | Discharge: 2015-09-21 | Disposition: A | Discharge status: Home / Self Care | Payer: 59 | Source: Ambulatory Visit | Attending: Family Medicine | Admitting: Family Medicine

## 2015-09-21 ENCOUNTER — Other Ambulatory Visit: Payer: Self-pay

## 2015-09-21 VITALS — BP 132/82 | HR 78 | Ht 68.0 in | Wt 181.0 lb

## 2015-09-21 DIAGNOSIS — M1990 Unspecified osteoarthritis, unspecified site: Secondary | ICD-10-CM

## 2015-09-21 DIAGNOSIS — M19032 Primary osteoarthritis, left wrist: Secondary | ICD-10-CM | POA: Insufficient documentation

## 2015-09-21 DIAGNOSIS — M795 Residual foreign body in soft tissue: Secondary | ICD-10-CM | POA: Diagnosis not present

## 2015-09-21 DIAGNOSIS — M25532 Pain in left wrist: Secondary | ICD-10-CM

## 2015-09-21 IMAGING — DX DG WRIST COMPLETE 3+V*L*
4 series · 4 of 4 positions shown · non-contrast
Comparison: None.

CLINICAL DATA: Lateral left wrist pain for 6 months. No known
injury. Initial encounter.

EXAM:
LEFT WRIST - COMPLETE 3+ VIEW

[wrist pa]
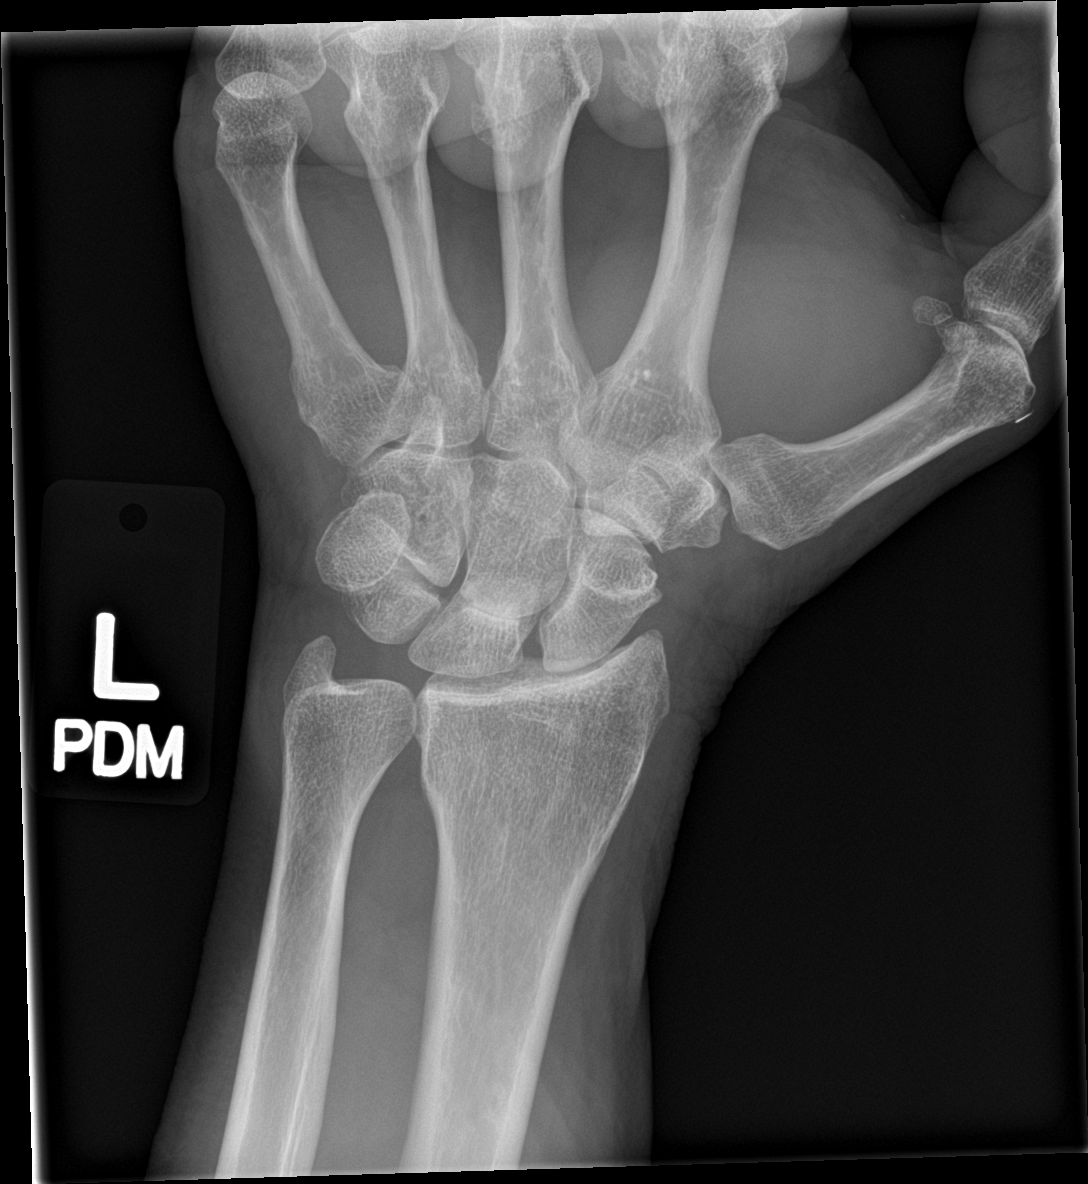

[wrist obl]
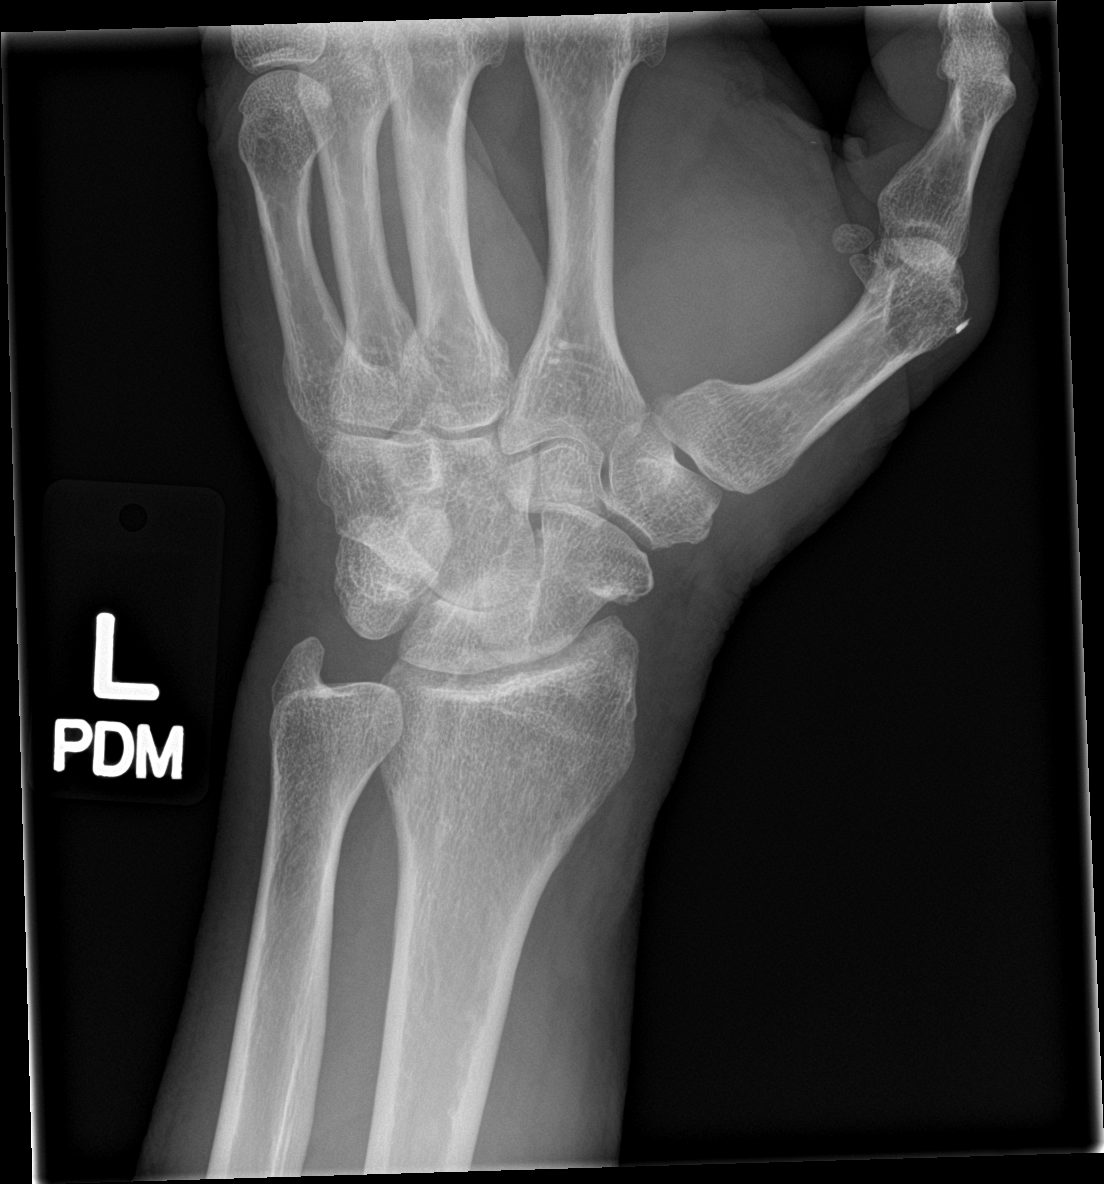

[wrist lat]
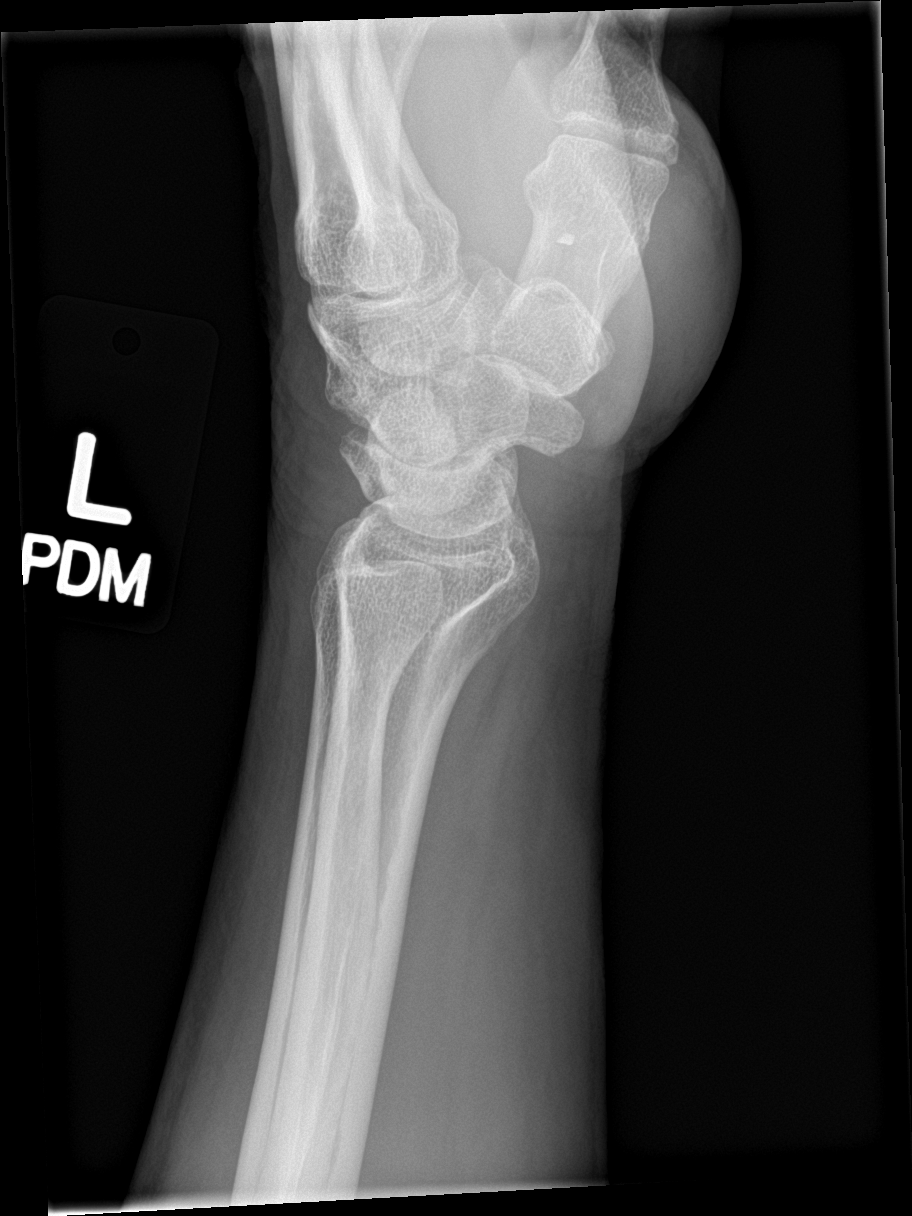

[navicular]
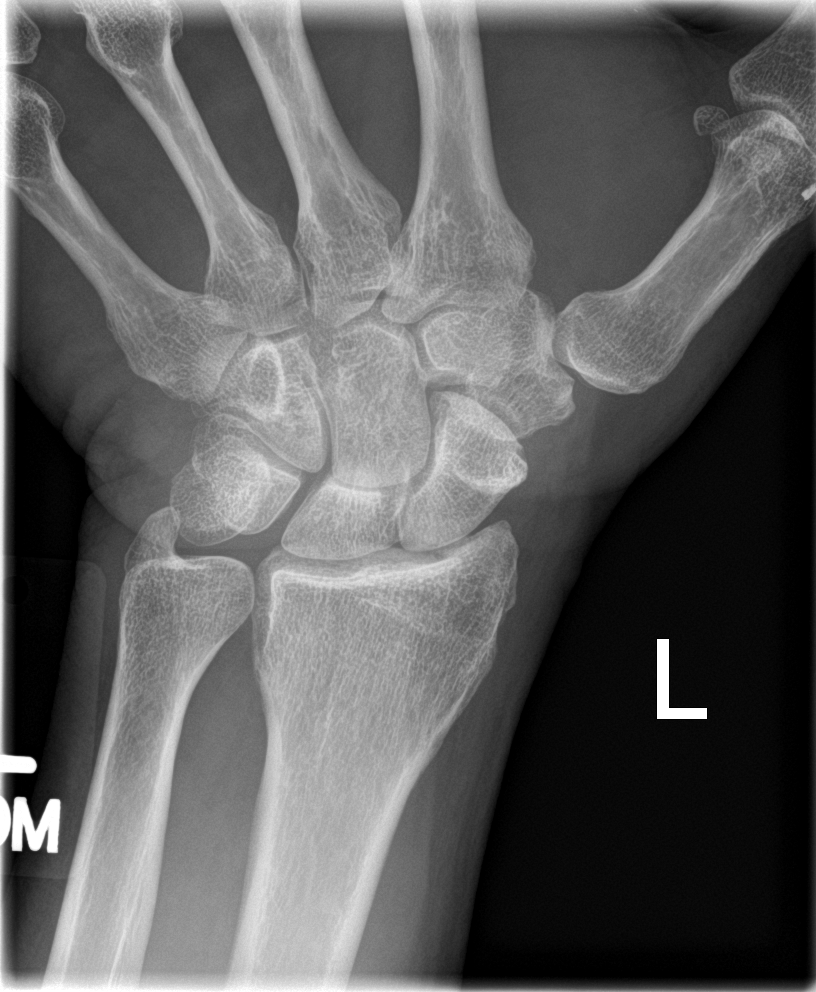

[4 of 4 positions shown; findings below may reference images not displayed]

FINDINGS: No acute bony or joint abnormality is identified. A 0.3 mm in length
radiopaque foreign body is seen in the subcutaneous soft tissues
along the dorsal margin of the head of the first metacarpal. No
notable degenerative disease identified.
IMPRESSION: No acute abnormality.

Small radiopaque foreign body in the dorsal soft tissues along the
first metacarpal is likely chronic.

## 2015-09-21 MED ORDER — DICLOFENAC SODIUM 2 % TD SOLN: TRANSDERMAL | Status: DC

## 2015-09-21 NOTE — Patient Instructions (Signed)
Good to see you  Xray of left wrist today  I think it is severe arthritis with possible avascular necrosis.  pennsaid pinkie amount topically 2 times daily as needed.  Ice if pain is worse.  Tylenol 325 mg 3 times a day  Continue the turmeric 500mg  twice daily  If worsening pain then we can consider injections and possible bracing if it gets worse  Make an appointment in 3 weeks.

## 2015-09-21 NOTE — Progress Notes (Signed)
Pre visit review using our clinic review tool, if applicable. No additional management support is needed unless otherwise documented below in the visit note. 

## 2015-09-21 NOTE — Assessment & Plan Note (Signed)
Severe arthritis of the left wrist with capsulitis. We discussed with patient about different treatment options and patient elected try conservative therapy. Patient did do mild range of motion exercise. Given topical anti-inflammatories. X-rays are pending to further evaluate for avascular necrosis. We discussed possible custom bracing if necessary. Patient has tramadol for breakthrough pain. We discussed Tylenol as a possibility as well. Patient does drink alcohol regularly and well monitor this. Patient and will come back and see me again in 3 weeks and if worsening pain we'll consider injection.

## 2015-09-21 NOTE — Progress Notes (Signed)
[facility] [address] [place], [place] [address] Phone: [phone] Subjective:     CC: left wrist pain   [id number] Edwin Brown is a 60 y.o. male coming in with complaint of Left wrist pain. Patient has had this for multiple years. Seems to be worsening. Patient does do a lot of activity with horses. Patient states that it seems to sometimes has swelling on the dorsal aspect. Patient states sometimes he has days where he cannot use the hand at all. Patient needs to continue to be active. Patient denies any numbness. Just states that it can be severe pain that can wake him up. Does respond fairly well to anti-inflammatories. Never is without pain. States that pushups more than 5 causes pain as well.     Past Medical History  Diagnosis Date  . HTN (hypertension)   . Syncope    Past Surgical History  Procedure Laterality Date  . Nasal sinus surgery    . Appendectomy    . Vasectomy    . Cholecystectomy N/A 01/30/2014    Procedure: LAPAROSCOPIC CHOLECYSTECTOMY WITH INTRAOPERATIVE CHOLANGIOGRAM;  Surgeon: Pedro Earls, MD;  Location: [facility];  Service: General;  Laterality: N/A;   Social History   Social History  . Marital Status: Married    Spouse Name: N/A  . Number of Children: 3  . Years of Education: N/A   Occupational History  . farrier    Social History Main Topics  . Smoking status: Never Smoker   . Smokeless tobacco: Never Used  . Alcohol Use: Yes     Comment: ONCE A DAY   . Drug Use: No  . Sexual Activity: Not Asked   Other Topics Concern  . None   Social History Narrative   Allergies  Allergen Reactions  . Dilaudid [Hydromorphone Hcl] Shortness Of Breath  . Phenergan [Promethazine Hcl] Other (See Comments)    Shaky, very out of sorts   . Other     HORSE SERUM TETANUS ANTI TOX : PATIENT GETS HIGH FEVER   Family History  Problem Relation Age of Onset  . Heart attack Father   . Heart attack Brother     Past  medical history, social, surgical and family history all reviewed in electronic medical record.  No pertanent information unless stated regarding to the chief complaint.   Review of Systems: No headache, visual changes, nausea, vomiting, diarrhea, constipation, dizziness, abdominal pain, skin rash, fevers, chills, night sweats, weight loss, swollen lymph nodes, body aches, joint swelling, muscle aches, chest pain, shortness of breath, mood changes.   Objective Blood pressure 132/82, pulse 78, height 5\' 8"  (1.727 m), weight 181 lb (82.101 kg), SpO2 98 %.  General: No apparent distress alert and oriented x3 mood and affect normal, dressed appropriately.  HEENT: Pupils equal, extraocular movements intact  Respiratory: Patient's speak in full sentences and does not appear short of breath  Cardiovascular: No lower extremity edema, non tender, no erythema  Skin: Warm dry intact with no signs of infection or rash on extremities or on axial skeleton.  Abdomen: Soft nontender  Neuro: Cranial nerves II through XII are intact, neurovascularly intact in all extremities with 2+ DTRs and 2+ pulses.  Lymph: No lymphadenopathy of posterior or anterior cervical chain or axillae bilaterally.  Gait normal with good balance and coordination.  MSK:  Non tender with full range of motion and good stability and symmetric strength and tone of shoulders, elbows,  hip, knee and  ankles bilaterally.  Wrist:left Significant arthritic changes noted. Patient does have limited extension secondary to pain as well as crepitus. Patient is severely tender over the scapholunate area. Painful in the anatomical snuffbox. Neurovascular intact distally. 4 out of 5 grip strength compared to contralateral side.  MSK US performed [id number] wrist This study was ordered, performed, and interpreted by Charlann Boxer D.O.  Wrist: All extensor compartments visualized and tendons all normal in appearance without fraying, tears, or sheath  effusions. No effusion seen. Degenerative changes of the TFCC Severe arthritic changes of multiple joints of the wrist. Patient does have what appears to be possibly avascular necrosis of the scaphoid bone. Capsulitis noted of multiple joints. Carpal tunnel visualized and median nerve area normal, flexor tendons all normal in appearance without fraying, tears, or sheath effusions. Power doppler signal normal.  IMPRESSION:  Severe arthritis of the wrist as well as potential avascular necrosis of the scaphoid     Impression and Recommendations:     This case required medical decision making of moderate complexity.      Note: This dictation was prepared with Dragon dictation along with smaller phrase technology. Any transcriptional errors that result from this process are unintentional.

## 2015-09-29 MED FILL — ALPRAZolam 0.25 MG TABS: 0.25 | 30 days supply | Qty: 30 | Fill #0

## 2015-09-30 MED FILL — LOSARTAN-HCTZ 100-12.5 MG T: 100-12.5 | 90 days supply | Qty: 90 | Fill #1

## 2015-09-30 MED FILL — VENTOLIN HFA 90 MCG INHALER: 108 (90 BAS | 16 days supply | Qty: 18 | Fill #0

## 2015-10-12 NOTE — Progress Notes (Signed)
[name] [address] [place], [place] [address] Phone: [phone] Subjective:    CC: left wrist pain f/u  [id number]  Edwin Brown is a 60 y.o. male coming in with complaint of Left wrist pain. Patient has had this for multiple years. Patient did see me previously. Patient's on ultrasound seemed to have severe osteophytic changes of the wrist. Patient did have x-rays that surprisingly did not show significant arthritis. Patient has been doing some topical anti-inflammatories, icing and some range of motion exercises and has noticed some mild improvement. Continues to have pain though chronically. Sometimes a sharp pain that can stop him from activities. Denies any swelling or any numbness but does state that his hand is weaker than the other hand.     Past Medical History:  Diagnosis Date  . HTN (hypertension)   . Syncope    Past Surgical History:  Procedure Laterality Date  . APPENDECTOMY    . CHOLECYSTECTOMY N/A 01/30/2014   Procedure: LAPAROSCOPIC CHOLECYSTECTOMY WITH INTRAOPERATIVE CHOLANGIOGRAM;  Surgeon: Pedro Earls, MD;  Location: [facility];  Service: General;  Laterality: N/A;  . NASAL SINUS SURGERY    . VASECTOMY     Social History   Social History  . Marital status: Married    Spouse name: N/A  . Number of children: 3  . Years of education: N/A   Occupational History  . farrier    Social History Main Topics  . Smoking status: Never Smoker  . Smokeless tobacco: Never Used  . Alcohol use Yes     Comment: ONCE A DAY   . Drug use: No  . Sexual activity: Not on file   Other Topics Concern  . Not on file   Social History Narrative  . No narrative on file   Allergies  Allergen Reactions  . Dilaudid [Hydromorphone Hcl] Shortness Of Breath  . Phenergan [Promethazine Hcl] Other (See Comments)    Shaky, very out of sorts   . Other     HORSE SERUM TETANUS ANTI TOX : PATIENT GETS HIGH FEVER   Family History  Problem  Relation Age of Onset  . Heart attack Father   . Heart attack Brother     Past medical history, social, surgical and family history all reviewed in electronic medical record.  No pertanent information unless stated regarding to the chief complaint.   Review of Systems: No headache, visual changes, nausea, vomiting, diarrhea, constipation, dizziness, abdominal pain, skin rash, fevers, chills, night sweats, weight loss, swollen lymph nodes, body aches, joint swelling, muscle aches, chest pain, shortness of breath, mood changes.   Objective  There were no vitals taken for this visit.  General: No apparent distress alert and oriented x3 mood and affect normal, dressed appropriately.  HEENT: Pupils equal, extraocular movements intact  Respiratory: Patient's speak in full sentences and does not appear short of breath  Cardiovascular: No lower extremity edema, non tender, no erythema  Skin: Warm dry intact with no signs of infection or rash on extremities or on axial skeleton.  Abdomen: Soft nontender  Neuro: Cranial nerves II through XII are intact, neurovascularly intact in all extremities with 2+ DTRs and 2+ pulses.  Lymph: No lymphadenopathy of posterior or anterior cervical chain or axillae bilaterally.  Gait normal with good balance and coordination.  MSK:  Non tender with full range of motion and good stability and symmetric strength and tone of shoulders, elbows,  hip, knee and ankles bilaterally.  [place]  Significant arthritic changes noted. Patient does have limited extension secondary to pain as well as crepitus. Patient is severely tender over the scapholunate area. Less painful over the anatomical snuff box but more painful over the TFCC  MSK US performed [id number] wrist This study was ordered, performed, and interpreted by Charlann Boxer D.O.  Wrist: All extensor compartments visualized and tendons all normal in appearance without fraying, tears, or sheath effusions. No effusion  seen. Degenerative changes of the TFCC with a possible acute tear arthritic changes of multiple joints of the wrist. No erosive changes but does appear to have arthritic changes Carpal tunnel visualized and median nerve area normal, flexor tendons all normal in appearance without fraying, tears, or sheath effusions. Power doppler signal normal.  IMPRESSION:  Severe arthritic or other calcific degenerative changes of the wrist and TFCC tear  Procedure: Real-time Ultrasound Guided Injection of left wrist TFCC Device: GE Logiq E  Ultrasound guided injection is preferred based studies that show increased duration, increased effect, greater accuracy, decreased procedural pain, increased response rate, and decreased cost with ultrasound guided versus blind injection.  Verbal informed consent obtained.  Time-out conducted.  Noted no overlying erythema, induration, or other signs of local infection.  Skin prepped in a sterile fashion.  Local anesthesia: Topical Ethyl chloride.  With sterile technique and under real time ultrasound guidance:  With a 25-gauge half-inch needle patient was injected with 0.5 mL of 0.5% Marcaine and 0.5 mL of Kenalog 40 mg/dL. Tolerated procedure well with significant decrease in pain. Completed without difficulty  Pain immediately resolved suggesting accurate placement of the medication.  Advised to call if fevers/chills, erythema, induration, drainage, or persistent bleeding.  Images permanently stored and available for review in the ultrasound unit.  Impression: Technically successful ultrasound guided injection.   Impression and Recommendations:     This case required medical decision making of moderate complexity.      Note: This dictation was prepared with Dragon dictation along with smaller phrase technology. Any transcriptional errors that result from this process are unintentional.

## 2015-10-13 ENCOUNTER — Other Ambulatory Visit: Payer: Self-pay

## 2015-10-13 ENCOUNTER — Ambulatory Visit (INDEPENDENT_AMBULATORY_CARE_PROVIDER_SITE_OTHER): Payer: 59 | Admitting: Family Medicine

## 2015-10-13 ENCOUNTER — Encounter: Payer: Self-pay | Admitting: Family Medicine

## 2015-10-13 VITALS — BP 126/72 | HR 78 | Wt 180.0 lb

## 2015-10-13 DIAGNOSIS — M1990 Unspecified osteoarthritis, unspecified site: Secondary | ICD-10-CM

## 2015-10-13 DIAGNOSIS — S63592A Other specified sprain of left wrist, initial encounter: Secondary | ICD-10-CM

## 2015-10-13 DIAGNOSIS — M25532 Pain in left wrist: Secondary | ICD-10-CM | POA: Diagnosis not present

## 2015-10-13 DIAGNOSIS — S63599A Other specified sprain of unspecified wrist, initial encounter: Secondary | ICD-10-CM | POA: Insufficient documentation

## 2015-10-13 DIAGNOSIS — M19032 Primary osteoarthritis, left wrist: Secondary | ICD-10-CM

## 2015-10-13 NOTE — Assessment & Plan Note (Signed)
Discussed with patient at great length. Patient elected try an injection for diagnostic and therapeutic possibility. Did have fairly good resolution of pain a most immediately. Patient is fairly helpful that this will help. We discussed continuing bracing and given a brace to wear at night. We discussed medications including tramadol for breakthrough pain. We discussed icing regimen. Patient will continue the other medications. Follow-up in 2-3 weeks. If worsening symptoms I would consider labs to rule out other contribute intact her such as autoimmune diseases or gout. If patient does respond that the pain gets worse we may want to consider an MR arthrogram to further evaluate the TFCC if patient would consider surgical intervention.

## 2015-10-13 NOTE — Assessment & Plan Note (Signed)
Ultrasound does not correspond with the x-rays. Ultrasound does appear that there is significant more calcific changes. Possible need for further workup including laboratory workup and advance imaging. We will discuss with patient later.

## 2015-10-13 NOTE — Patient Instructions (Signed)
We tried injecting the TFCC Try the brace at night I can make yu a custom brace but may get in the way of your work  Keep doing the ice See em again in 3 weeks.  If worse we may consider MRI of the wrist in the long run.

## 2015-10-29 MED FILL — ALPRAZolam 0.25 MG TABS: 0.25 | 30 days supply | Qty: 30 | Fill #0

## 2015-11-03 DIAGNOSIS — F329 Major depressive disorder, single episode, unspecified: Secondary | ICD-10-CM | POA: Diagnosis not present

## 2015-11-03 MED FILL — CITALOPRAM HBR 40 MG TABLET: 40 | 30 days supply | Qty: 30 | Fill #0

## 2015-11-04 ENCOUNTER — Ambulatory Visit: Payer: 59 | Admitting: Family Medicine

## 2015-11-26 MED FILL — ALPRAZolam 0.25 MG TABS: 0.25 | 30 days supply | Qty: 30 | Fill #0

## 2015-12-01 DIAGNOSIS — H524 Presbyopia: Secondary | ICD-10-CM | POA: Diagnosis not present

## 2015-12-01 DIAGNOSIS — H5203 Hypermetropia, bilateral: Secondary | ICD-10-CM | POA: Diagnosis not present

## 2015-12-13 MED FILL — CITALOPRAM HBR 40 MG TABLET: 40 | 30 days supply | Qty: 30 | Fill #1

## 2015-12-27 MED FILL — ALPRAZolam 0.25 MG TABS: 0.25 | 30 days supply | Qty: 30 | Fill #0

## 2016-01-28 MED FILL — ALPRAZolam 0.25 MG TABS: 0.25 | 30 days supply | Qty: 30 | Fill #0

## 2016-02-21 MED FILL — CITALOPRAM HBR 40 MG TABLET: 40 | 30 days supply | Qty: 30 | Fill #2

## 2016-02-22 MED FILL — LOSARTAN-HCTZ 100-12.5 MG T: 100-12.5 | 30 days supply | Qty: 30 | Fill #0

## 2016-02-24 DIAGNOSIS — J45909 Unspecified asthma, uncomplicated: Secondary | ICD-10-CM | POA: Diagnosis not present

## 2016-02-28 MED FILL — predniSONE 20 MG TABS: 20 | 9 days supply | Qty: 18 | Fill #0

## 2016-03-01 MED FILL — ALPRAZolam 0.25 MG TABS: 0.25 | 30 days supply | Qty: 30 | Fill #0

## 2016-03-20 MED FILL — OSELTAMIVIR PHOS 75 MG CAP: 75 | 5 days supply | Qty: 10 | Fill #0

## 2016-03-27 MED FILL — LOSARTAN-HCTZ 100-12.5 MG T: 100-12.5 | 30 days supply | Qty: 30 | Fill #1

## 2016-04-04 MED FILL — ALPRAZolam 0.25 MG TABS: 0.25 | 30 days supply | Qty: 30 | Fill #0

## 2016-04-05 MED FILL — SYMBICORT 160-4.5 MCG INH: 160-4.5 | 30 days supply | Qty: 10 | Fill #0

## 2016-05-04 MED FILL — LOSARTAN-HCTZ 100-12.5 MG T: 100-12.5 | 30 days supply | Qty: 30 | Fill #2

## 2016-05-04 MED FILL — CITALOPRAM HBR 40 MG TABLET: 40 | 30 days supply | Qty: 30 | Fill #3

## 2016-05-05 MED FILL — ALPRAZolam 0.25 MG TABS: 0.25 | 30 days supply | Qty: 30 | Fill #0

## 2016-05-29 MED FILL — VENTOLIN HFA 90 MCG INHALER: 108 (90 BAS | 16 days supply | Qty: 18 | Fill #1

## 2016-05-29 MED FILL — SYMBICORT 160-4.5 MCG INH: 160-4.5 | 30 days supply | Qty: 10 | Fill #1

## 2016-06-06 MED FILL — LOSARTAN-HCTZ 100-12.5 MG T: 100-12.5 | 30 days supply | Qty: 30 | Fill #3

## 2016-06-06 MED FILL — CITALOPRAM HBR 40 MG TABLET: 40 | 30 days supply | Qty: 30 | Fill #4

## 2016-06-07 MED FILL — ALPRAZolam 0.25 MG TABS: 0.25 | 30 days supply | Qty: 30 | Fill #0

## 2016-07-12 MED FILL — LOSARTAN-HCTZ 100-12.5 MG T: 100-12.5 | 30 days supply | Qty: 30 | Fill #4

## 2016-07-12 MED FILL — CITALOPRAM HBR 40 MG TABLET: 40 | 30 days supply | Qty: 30 | Fill #5

## 2016-07-14 MED FILL — ALPRAZolam 0.25 MG TABS: 0.25 | 30 days supply | Qty: 30 | Fill #0

## 2016-07-27 MED FILL — SYMBICORT 160-4.5 MCG INH: 160-4.5 | 30 days supply | Qty: 10 | Fill #2

## 2016-08-14 MED FILL — LOSARTAN-HCTZ 100-12.5 MG T: 100-12.5 | 30 days supply | Qty: 30 | Fill #5

## 2016-08-14 MED FILL — ALPRAZolam 0.25 MG TABS: 0.25 | 30 days supply | Qty: 30 | Fill #0

## 2016-09-12 MED FILL — ALPRAZolam 0.25 MG TABS: 0.25 | 30 days supply | Qty: 30 | Fill #0

## 2016-09-15 MED FILL — LOSARTAN-HCTZ 100-12.5 MG T: 100-12.5 | 60 days supply | Qty: 60 | Fill #0

## 2016-10-12 MED FILL — SYMBICORT 160-4.5 MCG INH: 160-4.5 | 60 days supply | Qty: 10 | Fill #3

## 2016-10-13 MED FILL — ALPRAZolam 0.25 MG TABS: 0.25 | 30 days supply | Qty: 30 | Fill #0

## 2016-10-19 ENCOUNTER — Ambulatory Visit: Payer: Self-pay

## 2016-10-19 ENCOUNTER — Ambulatory Visit (INDEPENDENT_AMBULATORY_CARE_PROVIDER_SITE_OTHER): Payer: 59 | Admitting: Family Medicine

## 2016-10-19 ENCOUNTER — Encounter: Payer: Self-pay | Admitting: Family Medicine

## 2016-10-19 VITALS — BP 140/80 | HR 73 | Ht 68.5 in | Wt 180.0 lb

## 2016-10-19 DIAGNOSIS — S63592D Other specified sprain of left wrist, subsequent encounter: Secondary | ICD-10-CM | POA: Diagnosis not present

## 2016-10-19 DIAGNOSIS — M25532 Pain in left wrist: Secondary | ICD-10-CM

## 2016-10-19 NOTE — Patient Instructions (Signed)
You know the drill  Stay active Can repeat injection every 3-4 months if needed but see me when you need me

## 2016-10-19 NOTE — Assessment & Plan Note (Signed)
Worsening symptoms again. Encourage the home exercises on a regular basis, icing regimen, which activities to do a which ones to avoid. Patient is on a start increasing activity as tolerated. Follow-up with me again in 4 weeks if pain is not resolved otherwise can follow-up as needed.

## 2016-10-19 NOTE — Progress Notes (Signed)
[name] [address], [place] [address] Phone: [phone] Subjective:    I'm seeing this patient by the request  of:    CC: Left wrist pain  Edwin Brown  XAIDYN Edwin Brown is a 61 y.o. male coming in with complaint of left wrist pain. Patient was seen one year ago and was diagnosed with more of a TFCC tear. Patient states that unfortunately he is having worsening pain again. Respond initially to the injection and had significant pain relief until the last 4 months. 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 Finding it more difficult to do things. Patient denies any numbness. States that certain motions can cause a severe sharp pain and is starting to affect her daily activities.       Past Medical History:  Diagnosis Date  . HTN (hypertension)   . Syncope    Past Surgical History:  Procedure Laterality Date  . APPENDECTOMY    . CHOLECYSTECTOMY N/A 01/30/2014   Procedure: LAPAROSCOPIC CHOLECYSTECTOMY WITH INTRAOPERATIVE CHOLANGIOGRAM;  Surgeon: Pedro Earls, MD;  Location: [facility];  Service: General;  Laterality: N/A;  . NASAL SINUS SURGERY    . VASECTOMY     Social History   Social History  . Marital status: Married    Spouse name: N/A  . Number of children: 3  . Years of education: N/A   Occupational History  . farrier    Social History Main Topics  . Smoking status: Never Smoker  . Smokeless tobacco: Never Used  . Alcohol use Yes     Comment: ONCE A DAY   . Drug use: No  . Sexual activity: Not on file   Other Topics Concern  . Not on file   Social History Narrative  . No narrative on file   Allergies  Allergen Reactions  . Dilaudid [Hydromorphone Hcl] Shortness Of Breath  . Phenergan [Promethazine Hcl] Other (See Comments)    Shaky, very out of sorts   . Other     HORSE SERUM TETANUS ANTI TOX : PATIENT GETS HIGH FEVER   Family History  Problem Relation Age of Onset  . Heart attack Father     . Heart attack Brother      Past medical history, social, surgical and family history all reviewed in electronic medical record.  No pertanent information unless stated regarding to the chief complaint.   Review of Systems:Review of systems updated and as accurate as of 10/19/16  No headache, visual changes, nausea, vomiting, diarrhea, constipation, dizziness, abdominal pain, skin rash, fevers, chills, night sweats, weight loss, swollen lymph nodes, body aches, joint swelling, muscle aches, chest pain, shortness of breath, mood changes.   Objective  There were no vitals taken for this visit. Systems examined below as of 10/19/16   General: No apparent distress alert and oriented x3 mood and affect normal, dressed appropriately.  HEENT: Pupils equal, extraocular movements intact  Respiratory: Patient's speak in full sentences and does not appear short of breath  Cardiovascular: No lower extremity edema, non tender, no erythema  Skin: Warm dry intact with no signs of infection or rash on extremities or on axial skeleton.  Abdomen: Soft nontender  Neuro: Cranial nerves II through XII are intact, neurovascularly intact in all extremities with 2+ DTRs and 2+ pulses.  Lymph: No lymphadenopathy of posterior or anterior cervical chain or axillae bilaterally.  Gait normal with good balance and coordination.  MSK:  Non tender with full  range of motion and good stability and symmetric strength and tone of shoulders, elbows, , hip, knee and ankles bilaterally. Mild arthritic changes of multiple joints Wrist: Left Inspection normal with no visible erythema or swelling. ROM smooth and normal with good flexion and extension and ulnar/radial deviation that is symmetrical with opposite wrist. Tender over the TFCC and does have pain with resisted abduction No snuffbox tenderness. No tenderness over Canal of Guyon. Strength 5/5 in all directions without pain. Negative Finkelstein, tinel's and  phalens. Negative Watson's test.  Procedure: Real-time Ultrasound Guided Injection of right TFCC Device: GE Logiq Q7 Ultrasound guided injection is preferred based studies that show increased duration, increased effect, greater accuracy, decreased procedural pain, increased response rate, and decreased cost with ultrasound guided versus blind injection.  Verbal informed consent obtained.  Time-out conducted.  Noted no overlying erythema, induration, or other signs of local infection.  Skin prepped in a sterile fashion.  Local anesthesia: Topical Ethyl chloride.  With sterile technique and under real time ultrasound guidance: With a 25-gauge half-inch needle was injected with a total of 0.5 mL of 0.5% Marcaine and 0.5 mL of Kenalog 40 mg/dL  Completed without difficulty  Pain immediately resolved suggesting accurate placement of the medication.  Advised to call if fevers/chills, erythema, induration, drainage, or persistent bleeding.  Images permanently stored and available for review in the ultrasound unit.  Impression: Technically successful ultrasound guided injection.     Impression and Recommendations:     This case required medical decision making of moderate complexity.      Note: This dictation was prepared with Dragon dictation along with smaller phrase technology. Any transcriptional errors that result from this process are unintentional.

## 2016-10-27 DIAGNOSIS — Z125 Encounter for screening for malignant neoplasm of prostate: Secondary | ICD-10-CM | POA: Diagnosis not present

## 2016-10-27 DIAGNOSIS — J309 Allergic rhinitis, unspecified: Secondary | ICD-10-CM | POA: Diagnosis not present

## 2016-10-27 DIAGNOSIS — K219 Gastro-esophageal reflux disease without esophagitis: Secondary | ICD-10-CM | POA: Diagnosis not present

## 2016-10-27 DIAGNOSIS — F329 Major depressive disorder, single episode, unspecified: Secondary | ICD-10-CM | POA: Diagnosis not present

## 2016-10-27 DIAGNOSIS — J45909 Unspecified asthma, uncomplicated: Secondary | ICD-10-CM | POA: Diagnosis not present

## 2016-10-27 DIAGNOSIS — Z0000 Encounter for general adult medical examination without abnormal findings: Secondary | ICD-10-CM | POA: Diagnosis not present

## 2016-10-27 DIAGNOSIS — I10 Essential (primary) hypertension: Secondary | ICD-10-CM | POA: Diagnosis not present

## 2016-10-27 MED FILL — EPINEPHRINE 0.3 MG AUTO-INJ: 0.3 | 30 days supply | Qty: 2 | Fill #0

## 2016-11-14 MED FILL — LOSARTAN-HCTZ 100-12.5 MG T: 100-12.5 | 90 days supply | Qty: 90 | Fill #0

## 2016-11-14 MED FILL — CITALOPRAM HBR 40 MG TABLET: 40 | 90 days supply | Qty: 45 | Fill #0

## 2016-12-18 DIAGNOSIS — R8290 Unspecified abnormal findings in urine: Secondary | ICD-10-CM | POA: Diagnosis not present

## 2016-12-18 DIAGNOSIS — R630 Anorexia: Secondary | ICD-10-CM | POA: Diagnosis not present

## 2016-12-18 DIAGNOSIS — R112 Nausea with vomiting, unspecified: Secondary | ICD-10-CM | POA: Diagnosis not present

## 2016-12-18 DIAGNOSIS — R197 Diarrhea, unspecified: Secondary | ICD-10-CM | POA: Diagnosis not present

## 2016-12-18 DIAGNOSIS — R109 Unspecified abdominal pain: Secondary | ICD-10-CM | POA: Diagnosis not present

## 2016-12-18 DIAGNOSIS — E869 Volume depletion, unspecified: Secondary | ICD-10-CM | POA: Diagnosis not present

## 2016-12-19 DIAGNOSIS — R8290 Unspecified abnormal findings in urine: Secondary | ICD-10-CM | POA: Diagnosis not present

## 2016-12-21 MED FILL — ALPRAZolam 0.25 MG TABS: 0.25 | 30 days supply | Qty: 30 | Fill #0

## 2017-01-22 MED FILL — ALPRAZolam 0.25 MG TABS: 0.25 | 30 days supply | Qty: 30 | Fill #0

## 2017-02-05 MED FILL — CITALOPRAM HBR 40 MG TABLET: 40 | 90 days supply | Qty: 45 | Fill #1

## 2017-02-21 MED FILL — ALPRAZolam 0.25 MG TABS: 0.25 | 30 days supply | Qty: 30 | Fill #0

## 2017-02-26 DIAGNOSIS — M546 Pain in thoracic spine: Secondary | ICD-10-CM | POA: Diagnosis not present

## 2017-02-26 DIAGNOSIS — M9902 Segmental and somatic dysfunction of thoracic region: Secondary | ICD-10-CM | POA: Diagnosis not present

## 2017-02-26 DIAGNOSIS — M9903 Segmental and somatic dysfunction of lumbar region: Secondary | ICD-10-CM | POA: Diagnosis not present

## 2017-02-26 DIAGNOSIS — M545 Low back pain: Secondary | ICD-10-CM | POA: Diagnosis not present

## 2017-03-10 MED FILL — LOSARTAN-HCTZ 100-12.5 MG T: 100-12.5 | 90 days supply | Qty: 90 | Fill #1

## 2017-03-29 MED FILL — ALPRAZolam 0.25 MG TABS: 0.25 | 30 days supply | Qty: 30 | Fill #0

## 2017-04-03 DIAGNOSIS — M545 Low back pain: Secondary | ICD-10-CM | POA: Diagnosis not present

## 2017-04-03 DIAGNOSIS — M546 Pain in thoracic spine: Secondary | ICD-10-CM | POA: Diagnosis not present

## 2017-04-03 DIAGNOSIS — M9902 Segmental and somatic dysfunction of thoracic region: Secondary | ICD-10-CM | POA: Diagnosis not present

## 2017-04-03 DIAGNOSIS — M9903 Segmental and somatic dysfunction of lumbar region: Secondary | ICD-10-CM | POA: Diagnosis not present

## 2017-04-06 DIAGNOSIS — M545 Low back pain: Secondary | ICD-10-CM | POA: Diagnosis not present

## 2017-04-06 DIAGNOSIS — M9903 Segmental and somatic dysfunction of lumbar region: Secondary | ICD-10-CM | POA: Diagnosis not present

## 2017-04-06 DIAGNOSIS — M546 Pain in thoracic spine: Secondary | ICD-10-CM | POA: Diagnosis not present

## 2017-04-06 DIAGNOSIS — M9902 Segmental and somatic dysfunction of thoracic region: Secondary | ICD-10-CM | POA: Diagnosis not present

## 2017-04-09 DIAGNOSIS — H6990 Unspecified Eustachian tube disorder, unspecified ear: Secondary | ICD-10-CM | POA: Diagnosis not present

## 2017-04-09 DIAGNOSIS — R42 Dizziness and giddiness: Secondary | ICD-10-CM | POA: Diagnosis not present

## 2017-04-11 DIAGNOSIS — H66002 Acute suppurative otitis media without spontaneous rupture of ear drum, left ear: Secondary | ICD-10-CM | POA: Diagnosis not present

## 2017-05-01 MED FILL — ALPRAZolam 0.25 MG TABS: 0.25 | 30 days supply | Qty: 30 | Fill #0

## 2017-05-11 DIAGNOSIS — H6522 Chronic serous otitis media, left ear: Secondary | ICD-10-CM | POA: Diagnosis not present

## 2017-05-11 DIAGNOSIS — H6993 Unspecified Eustachian tube disorder, bilateral: Secondary | ICD-10-CM | POA: Diagnosis not present

## 2017-05-18 DIAGNOSIS — J45909 Unspecified asthma, uncomplicated: Secondary | ICD-10-CM | POA: Diagnosis not present

## 2017-05-18 DIAGNOSIS — L03114 Cellulitis of left upper limb: Secondary | ICD-10-CM | POA: Diagnosis not present

## 2017-05-18 MED FILL — CEPHALEXIN 500 MG CAPSULE: 500 | 7 days supply | Qty: 14 | Fill #0

## 2017-05-18 MED FILL — EPINEPHRINE 0.3 MG AUTO-INJ: 0.3 | 30 days supply | Qty: 2 | Fill #0

## 2017-05-18 MED FILL — CITALOPRAM HBR 40 MG TABLET: 40 | 90 days supply | Qty: 90 | Fill #0

## 2017-06-07 MED FILL — LOSARTAN-HCTZ 100-12.5 MG T: 100-12.5 | 90 days supply | Qty: 90 | Fill #0

## 2017-06-08 MED FILL — ALPRAZolam 0.25 MG TABS: 0.25 | 30 days supply | Qty: 30 | Fill #0

## 2017-07-05 DIAGNOSIS — J309 Allergic rhinitis, unspecified: Secondary | ICD-10-CM | POA: Diagnosis not present

## 2017-07-05 DIAGNOSIS — Z8709 Personal history of other diseases of the respiratory system: Secondary | ICD-10-CM | POA: Diagnosis not present

## 2017-07-05 MED FILL — AMOX-CLAV 875-125 MG TABLET: 875-125 | 10 days supply | Qty: 20 | Fill #0

## 2017-07-05 MED FILL — predniSONE 10 MG TABS: 10 | 6 days supply | Qty: 21 | Fill #0

## 2017-07-13 MED FILL — ALPRAZolam 0.25 MG TABS: 0.25 | 30 days supply | Qty: 30 | Fill #0

## 2017-07-23 MED FILL — CIPRODEX OTIC SUSPENSION: 0.3-0.1 | 19 days supply | Qty: 8 | Fill #0

## 2017-07-31 DIAGNOSIS — Z1211 Encounter for screening for malignant neoplasm of colon: Secondary | ICD-10-CM | POA: Diagnosis not present

## 2017-07-31 DIAGNOSIS — Z800 Family history of malignant neoplasm of digestive organs: Secondary | ICD-10-CM | POA: Diagnosis not present

## 2017-07-31 DIAGNOSIS — K5730 Diverticulosis of large intestine without perforation or abscess without bleeding: Secondary | ICD-10-CM | POA: Diagnosis not present

## 2017-08-08 DIAGNOSIS — H6521 Chronic serous otitis media, right ear: Secondary | ICD-10-CM | POA: Diagnosis not present

## 2017-08-08 DIAGNOSIS — H6983 Other specified disorders of Eustachian tube, bilateral: Secondary | ICD-10-CM | POA: Diagnosis not present

## 2017-08-14 DIAGNOSIS — H6521 Chronic serous otitis media, right ear: Secondary | ICD-10-CM | POA: Diagnosis not present

## 2017-08-14 MED FILL — GAVILYTE-G SOLUTION: 236 | 1 days supply | Qty: 4000 | Fill #0

## 2017-08-14 MED FILL — ALPRAZolam 0.25 MG TABS: 0.25 | 30 days supply | Qty: 30 | Fill #0

## 2017-09-17 DIAGNOSIS — D120 Benign neoplasm of cecum: Secondary | ICD-10-CM | POA: Diagnosis not present

## 2017-09-17 DIAGNOSIS — K5730 Diverticulosis of large intestine without perforation or abscess without bleeding: Secondary | ICD-10-CM | POA: Diagnosis not present

## 2017-09-17 DIAGNOSIS — Z1211 Encounter for screening for malignant neoplasm of colon: Secondary | ICD-10-CM | POA: Diagnosis not present

## 2017-09-17 DIAGNOSIS — D127 Benign neoplasm of rectosigmoid junction: Secondary | ICD-10-CM | POA: Diagnosis not present

## 2017-09-17 DIAGNOSIS — K635 Polyp of colon: Secondary | ICD-10-CM | POA: Diagnosis not present

## 2017-09-21 MED FILL — LOSARTAN-HCTZ 100-12.5 MG T: 100-12.5 | 90 days supply | Qty: 90 | Fill #1

## 2017-09-25 DIAGNOSIS — M5441 Lumbago with sciatica, right side: Secondary | ICD-10-CM | POA: Diagnosis not present

## 2017-09-25 DIAGNOSIS — M5442 Lumbago with sciatica, left side: Secondary | ICD-10-CM | POA: Diagnosis not present

## 2017-09-25 DIAGNOSIS — M542 Cervicalgia: Secondary | ICD-10-CM | POA: Diagnosis not present

## 2017-09-25 DIAGNOSIS — M9903 Segmental and somatic dysfunction of lumbar region: Secondary | ICD-10-CM | POA: Diagnosis not present

## 2017-09-28 MED FILL — VENTOLIN HFA 90 MCG INHALER: 108 (90 BAS | 16 days supply | Qty: 18 | Fill #0

## 2017-09-28 MED FILL — SYMBICORT 160-4.5 MCG INH: 160-4.5 | 60 days supply | Qty: 10 | Fill #0

## 2017-10-17 MED FILL — AMOX-CLAV 875-125 MG TABLET: 875-125 | 10 days supply | Qty: 20 | Fill #0

## 2017-10-26 DIAGNOSIS — H903 Sensorineural hearing loss, bilateral: Secondary | ICD-10-CM | POA: Diagnosis not present

## 2017-10-26 DIAGNOSIS — H6983 Other specified disorders of Eustachian tube, bilateral: Secondary | ICD-10-CM | POA: Diagnosis not present

## 2017-10-26 DIAGNOSIS — H6521 Chronic serous otitis media, right ear: Secondary | ICD-10-CM | POA: Diagnosis not present

## 2017-10-29 MED FILL — ALPRAZolam 0.25 MG TABS: 0.25 | 30 days supply | Qty: 30 | Fill #0

## 2017-12-04 MED FILL — ALPRAZolam 0.25 MG TABS: 0.25 | 30 days supply | Qty: 30 | Fill #0

## 2017-12-28 MED FILL — CITALOPRAM HBR 40 MG TABLET: 40 | 90 days supply | Qty: 90 | Fill #1

## 2017-12-28 MED FILL — LOSARTAN-HCTZ 100-12.5 MG T: 100-12.5 | 90 days supply | Qty: 90 | Fill #2

## 2018-01-02 MED FILL — ALPRAZolam 0.25 MG TABS: 0.25 | 30 days supply | Qty: 30 | Fill #0

## 2018-01-30 MED FILL — ALPRAZolam 0.25 MG TABS: 0.25 | 30 days supply | Qty: 30 | Fill #0

## 2018-02-21 DIAGNOSIS — M5416 Radiculopathy, lumbar region: Secondary | ICD-10-CM | POA: Diagnosis not present

## 2018-02-21 DIAGNOSIS — M546 Pain in thoracic spine: Secondary | ICD-10-CM | POA: Diagnosis not present

## 2018-02-21 DIAGNOSIS — M9903 Segmental and somatic dysfunction of lumbar region: Secondary | ICD-10-CM | POA: Diagnosis not present

## 2018-02-21 DIAGNOSIS — M9902 Segmental and somatic dysfunction of thoracic region: Secondary | ICD-10-CM | POA: Diagnosis not present

## 2018-03-04 DIAGNOSIS — I10 Essential (primary) hypertension: Secondary | ICD-10-CM | POA: Diagnosis not present

## 2018-03-25 NOTE — Progress Notes (Signed)
[facility] Edwin Brown, Edwin Brown Phone: [phone] Subjective:    I Edwin Brown am serving as a Education administrator for Dr. Hulan Saas.  CC: Left wrist pain  Edwin Brown  Edwin Brown is a 63 y.o. male coming in with complaint of left wrist pain. States that he would like an injection. States that it has been sore.  Patient was seen more than a year ago.  Was diagnosed with more of a TFCC tear and other difficulties with the wrist. Patient is feeling more numbness in the wrist intermittently.  Seems to be more in the thumb and index finger.  Patient denies any neck pain associated with it.  Sometimes loss of grip.  Sometimes when he moves his wrist in certain angles to the severe pain in the thumb area.     Past Medical History:  Diagnosis Date  . HTN (hypertension)   . Syncope    Past Surgical History:  Procedure Laterality Date  . APPENDECTOMY    . CHOLECYSTECTOMY N/A 01/30/2014   Procedure: LAPAROSCOPIC CHOLECYSTECTOMY WITH INTRAOPERATIVE CHOLANGIOGRAM;  Surgeon: Edwin Earls, MD;  Location: [facility];  Service: General;  Laterality: N/A;  . NASAL SINUS SURGERY    . VASECTOMY     Social History   Socioeconomic History  . Marital status: Married    Spouse name: Not on file  . Number of children: 3  . Years of education: Not on file  . Highest education level: Not on file  Occupational History  . Occupation: Insurance underwriter  Social Needs  . Financial resource strain: Not on file  . Food insecurity:    Worry: Not on file    Inability: Not on file  . Transportation needs:    Medical: Not on file    Non-medical: Not on file  Tobacco Use  . Smoking status: Never Smoker  . Smokeless tobacco: Never Used  Substance and Sexual Activity  . Alcohol use: Yes    Comment: ONCE A DAY   . Drug use: No  . Sexual activity: Not on file  Lifestyle  . Physical activity:    Days per week: Not on file    Minutes per session: Not on file  .  Stress: Not on file  Relationships  . Social connections:    Talks on phone: Not on file    Gets together: Not on file    Attends religious service: Not on file    Active member of club or organization: Not on file    Attends meetings of clubs or organizations: Not on file    Relationship status: Not on file  Other Topics Concern  . Not on file  Social History Narrative  . Not on file   Allergies  Allergen Reactions  . Dilaudid [Hydromorphone Hcl] Shortness Of Breath  . Phenergan [Promethazine Hcl] Other (See Comments)    Shaky, very out of sorts   . Other     HORSE SERUM TETANUS ANTI TOX : PATIENT GETS HIGH FEVER   Family History  Problem Relation Age of Onset  . Heart attack Father   . Heart attack Brother      Current Outpatient Medications (Cardiovascular):  .  losartan (COZAAR) 100 MG tablet, Take 100 mg by mouth daily.  Current Outpatient Medications (Respiratory):  .  albuterol (PROVENTIL HFA;VENTOLIN HFA) 108 (90 BASE) MCG/ACT inhaler, Inhale 1 puff into the lungs every 6 (six) hours as needed for wheezing or shortness of  breath. .  loratadine (CLARITIN) 10 MG tablet, Take 10 mg by mouth daily as needed for allergies.  Current Outpatient Medications (Analgesics):  .  traMADol (ULTRAM) 50 MG tablet, Take 50 mg by mouth every 4 (four) hours as needed for moderate pain.   Current Outpatient Medications (Other):  Marland Kitchen  ALPRAZolam (XANAX) 0.25 MG tablet, Take 0.25 mg by mouth at bedtime as needed for anxiety. .  clarithromycin (BIAXIN) 500 MG tablet, Take 500 mg by mouth 2 (two) times daily. For 10 days .  Diclofenac Sodium 2 % SOLN, Apply 1 pump twice daily. Edwin Brown (GLUCOSAMINE MSM COMPLEX PO), Take 2 capsules by mouth daily.  .  Turmeric Curcumin 500 MG CAPS, Take 1,000 mg by mouth daily.    Past medical history, social, surgical and family history all reviewed in electronic medical record.  No pertanent information unless stated regarding  to the chief complaint.   Review of Systems:  No headache, visual changes, nausea, vomiting, diarrhea, constipation, dizziness, abdominal pain, skin rash, fevers, chills, night sweats, weight loss, swollen lymph nodes, body aches, joint swelling, muscle aches, chest pain, shortness of breath, mood changes.   Objective  There were no vitals taken for this visit. Systems examined below as of    General: No apparent distress alert and oriented x3 mood and affect normal, dressed appropriately.  HEENT: Pupils equal, extraocular movements intact  Respiratory: Patient's speak in full sentences and does not appear short of breath  Cardiovascular: No lower extremity edema, non tender, no erythema  Skin: Warm dry intact with no signs of infection or rash on extremities or on axial skeleton.  Abdomen: Soft nontender  Neuro: Cranial nerves II through XII are intact, neurovascularly intact in all extremities with 2+ DTRs and 2+ pulses.  Lymph: No lymphadenopathy of posterior or anterior cervical chain or axillae bilaterally.  Gait normal with good balance and coordination.  MSK:  Non tender with full range of motion and good stability and symmetric strength and tone of shoulders, elbows, , hip, knee and ankles bilaterally.  Left wrist exam shows the patient has many scarring from previous injuries.  Positive Tinel sign noted.  4-5 grip strength compared to the contralateral side.  Patient also has a positive Finkelstein's test.  Limited musculoskeletal ultrasound was performed and interpreted by Edwin Brown  Limited ultrasound shows the patient does have significant enlargement of the median nerve with swelling noted within the neural sheath.  Seems to be more red near some scar tissue formation from previous injury of the soft tissue.  This is proximal from the proximal wrist crease by 1 cm. In addition patient does have mild to moderate de Quervain's tenosynovitis with hypoechoic changes within the  tendon sheath Impression: Carpal tunnel and de Quervain's tenosynovitis  Procedure: Real-time Ultrasound Guided Injection of left carpal tunnel Device: GE Logiq Q7 Ultrasound guided injection is preferred based studies that show increased duration, increased effect, greater accuracy, decreased procedural pain, increased response rate with ultrasound guided versus blind injection.  Verbal informed consent obtained.  Time-out conducted.  Noted no overlying erythema, induration, or other signs of local infection.  Skin prepped in a sterile fashion.  Local anesthesia: Topical Ethyl chloride.  With sterile technique and under real time ultrasound guidance:  median nerve visualized.  23g 5/8 inch needle inserted distal to proximal approach into nerve sheath. Pictures taken nfor needle placement. Patient did have injection of 2 cc of 0.5% Marcaine, and 1 cc of Kenalog 40 mg/dL.  Completed without difficulty  Pain immediately resolved suggesting accurate placement of the medication.  Advised to call if fevers/chills, erythema, induration, drainage, or persistent bleeding.  Images permanently stored and available for review in the ultrasound unit.  Impression: Technically successful ultrasound guided injection.  Procedure: Real-time Ultrasound Guided Injection of left Abductor pollicis longs tendon sheath Device: GE Logiq Q7  Ultrasound guided injection is preferred based studies that show increased duration, increased effect, greater accuracy, decreased procedural pain, increased response rate with ultrasound guided versus blind injection.  Verbal informed consent obtained.  Time-out conducted.  Noted no overlying erythema, induration, or other signs of local infection.  Skin prepped in a sterile fashion.  Local anesthesia: Topical Ethyl chloride.  With sterile technique and under real time ultrasound guidance:  tendon visualized.  23g 5/8 inch needle inserted distal to proximal approach into tendon  sheath. Pictures taken  for needle placement. Patient did have injection of 0.5 cc of of 0.5% Marcaine, and 0.5 cc of Kenalog 40 mg/dL. Completed without difficulty  Pain immediately resolved suggesting accurate placement of the medication.  Advised to call if fevers/chills, erythema, induration, drainage, or persistent bleeding.  Images permanently stored and available for review in the ultrasound unit.  Impression: Technically successful ultrasound guided injection.   Impression and Recommendations:     This case required medical decision making of moderate complexity. The above documentation has been reviewed and is accurate and complete Edwin Pulley, DO       Note: This dictation was prepared with Dragon dictation along with smaller phrase technology. Any transcriptional errors that result from this process are unintentional.

## 2018-03-26 ENCOUNTER — Ambulatory Visit: Payer: Self-pay

## 2018-03-26 ENCOUNTER — Encounter: Payer: Self-pay | Admitting: Family Medicine

## 2018-03-26 ENCOUNTER — Ambulatory Visit: Payer: 59 | Admitting: Family Medicine

## 2018-03-26 VITALS — BP 160/90 | HR 85 | Ht 68.5 in | Wt 186.0 lb

## 2018-03-26 DIAGNOSIS — M25532 Pain in left wrist: Principal | ICD-10-CM

## 2018-03-26 DIAGNOSIS — G5602 Carpal tunnel syndrome, left upper limb: Secondary | ICD-10-CM | POA: Diagnosis not present

## 2018-03-26 DIAGNOSIS — M654 Radial styloid tenosynovitis [de Quervain]: Secondary | ICD-10-CM | POA: Diagnosis not present

## 2018-03-26 DIAGNOSIS — G8929 Other chronic pain: Secondary | ICD-10-CM

## 2018-03-26 NOTE — Patient Instructions (Signed)
Goo to see you  Injected both spots today  I thin kit will work  Have an appointment with me in 4 weeks just in case otherwise see me when you need me

## 2018-03-26 NOTE — Assessment & Plan Note (Signed)
Injection given today  Discussed bracing discussed avoiding repetitive flexing patient declined any medications.  Follow-up again in 4 to 6 weeks

## 2018-03-26 NOTE — Assessment & Plan Note (Signed)
Injected today.  Tolerated procedure well.  Discussed icing regimen and home exercises.  Discussed bracing, discussed avoiding repetitive flexion of the thumb.  Discussed different ergonomics at work.  Follow-up again in 4 to 6 weeks

## 2018-04-02 MED FILL — ALPRAZolam 0.25 MG TABS: 0.25 | 30 days supply | Qty: 30 | Fill #0

## 2018-04-05 MED FILL — SYMBICORT 160-4.5 MCG INH: 160-4.5 | 60 days supply | Qty: 10 | Fill #1

## 2018-04-05 MED FILL — VENTOLIN HFA 90 MCG INHALER: 108 (90 BAS | 30 days supply | Qty: 18 | Fill #0

## 2018-04-05 MED FILL — CITALOPRAM HBR 40 MG TABLET: 40 | 90 days supply | Qty: 90 | Fill #0

## 2018-04-05 MED FILL — LOSARTAN-HCTZ 100-12.5 MG T: 100-12.5 | 30 days supply | Qty: 30 | Fill #0

## 2018-04-05 MED FILL — EPINEPHRINE 0.3 MG AUTO-INJ: 0.3 | 30 days supply | Qty: 2 | Fill #1

## 2018-04-08 DIAGNOSIS — Z0000 Encounter for general adult medical examination without abnormal findings: Secondary | ICD-10-CM | POA: Diagnosis not present

## 2018-04-08 DIAGNOSIS — F329 Major depressive disorder, single episode, unspecified: Secondary | ICD-10-CM | POA: Diagnosis not present

## 2018-04-08 DIAGNOSIS — Z125 Encounter for screening for malignant neoplasm of prostate: Secondary | ICD-10-CM | POA: Diagnosis not present

## 2018-04-08 DIAGNOSIS — Z13220 Encounter for screening for lipoid disorders: Secondary | ICD-10-CM | POA: Diagnosis not present

## 2018-04-08 DIAGNOSIS — J45909 Unspecified asthma, uncomplicated: Secondary | ICD-10-CM | POA: Diagnosis not present

## 2018-04-08 DIAGNOSIS — K219 Gastro-esophageal reflux disease without esophagitis: Secondary | ICD-10-CM | POA: Diagnosis not present

## 2018-04-08 DIAGNOSIS — I10 Essential (primary) hypertension: Secondary | ICD-10-CM | POA: Diagnosis not present

## 2018-04-08 DIAGNOSIS — J309 Allergic rhinitis, unspecified: Secondary | ICD-10-CM | POA: Diagnosis not present

## 2018-04-25 NOTE — Progress Notes (Signed)
[facility] Edwin Brown, [place] [address] Phone: [phone] Subjective:    I Edwin Brown am serving as a Education administrator for Dr. Hulan Saas.    CC: Thumb pain, hand pain  [id number]    03/26/2018  Injection given today  Discussed bracing discussed avoiding repetitive flexing patient declined any medications.  Follow-up again in 4 to 6 weeks.  Injected today.  Tolerated procedure well.  Discussed icing regimen and home exercises.  Discussed bracing, discussed avoiding repetitive flexion of the thumb.  Discussed different ergonomics at work.  Follow-up again in 4 to 6 weeks.  Updated 04/26/2018  Edwin Brown is a 63 y.o. male coming in with complaint of left wrist pain. States that he still has pain.  Patient states that some of the pain from the injections has improved but 80% of it is still around.  Patient is concerned because his main busy season is coming up.  Patient has had TFCC tear previously as well.     Past Medical History:  Diagnosis Date  . HTN (hypertension)   . Syncope    Past Surgical History:  Procedure Laterality Date  . APPENDECTOMY    . CHOLECYSTECTOMY N/A 01/30/2014   Procedure: LAPAROSCOPIC CHOLECYSTECTOMY WITH INTRAOPERATIVE CHOLANGIOGRAM;  Surgeon: Pedro Earls, MD;  Location: [facility];  Service: General;  Laterality: N/A;  . NASAL SINUS SURGERY    . VASECTOMY     Social History   Socioeconomic History  . Marital status: Married    Spouse name: Not on file  . Number of children: 3  . Years of education: Not on file  . Highest education level: Not on file  Occupational History  . Occupation: Insurance underwriter  Social Needs  . Financial resource strain: Not on file  . Food insecurity:    Worry: Not on file    Inability: Not on file  . Transportation needs:    Medical: Not on file    Non-medical: Not on file  Tobacco Use  . Smoking status: Never Smoker  . Smokeless tobacco: Never Used  Substance and  Sexual Activity  . Alcohol use: Yes    Comment: ONCE A DAY   . Drug use: No  . Sexual activity: Not on file  Lifestyle  . Physical activity:    Days per week: Not on file    Minutes per session: Not on file  . Stress: Not on file  Relationships  . Social connections:    Talks on phone: Not on file    Gets together: Not on file    Attends religious service: Not on file    Active member of club or organization: Not on file    Attends meetings of clubs or organizations: Not on file    Relationship status: Not on file  Other Topics Concern  . Not on file  Social History Narrative  . Not on file   Allergies  Allergen Reactions  . Dilaudid [Hydromorphone Hcl] Shortness Of Breath  . Phenergan [Promethazine Hcl] Other (See Comments)    Shaky, very out of sorts   . Other     HORSE SERUM TETANUS ANTI TOX : PATIENT GETS HIGH FEVER   Family History  Problem Relation Age of Onset  . Heart attack Father   . Heart attack Brother      Current Outpatient Medications (Cardiovascular):  .  losartan (COZAAR) 100 MG tablet, Take 100 mg by mouth daily.  Current Outpatient Medications (Respiratory):  .  albuterol (PROVENTIL HFA;VENTOLIN HFA) 108 (90 BASE) MCG/ACT inhaler, Inhale 1 puff into the lungs every 6 (six) hours as needed for wheezing or shortness of breath. .  loratadine (CLARITIN) 10 MG tablet, Take 10 mg by mouth daily as needed for allergies.  Current Outpatient Medications (Analgesics):  .  traMADol (ULTRAM) 50 MG tablet, Take 50 mg by mouth every 4 (four) hours as needed for moderate pain.   Current Outpatient Medications (Other):  Marland Kitchen  ALPRAZolam (XANAX) 0.25 MG tablet, Take 0.25 mg by mouth at bedtime as needed for anxiety. .  clarithromycin (BIAXIN) 500 MG tablet, Take 500 mg by mouth 2 (two) times daily. For 10 days .  Diclofenac Sodium 2 % SOLN, Apply 1 pump twice daily. Edwin Brown (GLUCOSAMINE MSM COMPLEX PO), Take 2 capsules by mouth daily.  .   Turmeric Curcumin 500 MG CAPS, Take 1,000 mg by mouth daily.    Past medical history, social, surgical and family history all reviewed in electronic medical record.  No pertanent information unless stated regarding to the chief complaint.   Review of Systems:  No headache, visual changes, nausea, vomiting, diarrhea, constipation, dizziness, abdominal pain, skin rash, fevers, chills, night sweats, weight loss, swollen lymph nodes, body aches, joint swelling,  chest pain, shortness of breath, mood changes.  Positive muscle aches  Objective  Blood pressure (!) 150/90, pulse 86, height 5\' 8"  (1.727 m), weight 187 lb (84.8 kg), SpO2 98 %.    General: No apparent distress alert and oriented x3 mood and affect normal, dressed appropriately.  HEENT: Pupils equal, extraocular movements intact  Respiratory: Patient's speak in full sentences and does not appear short of breath  Cardiovascular: No lower extremity edema, non tender, no erythema  Skin: Warm dry intact with no signs of infection or rash on extremities or on axial skeleton.  Abdomen: Soft nontender  Neuro: Cranial nerves II through XII are intact, neurovascularly intact in all extremities with 2+ DTRs and 2+ pulses.  Lymph: No lymphadenopathy of posterior or anterior cervical chain or axillae bilaterally.  Gait normal with good balance and coordination.  MSK:  Non tender with full range of motion and good stability and symmetric strength and tone of shoulders, elbows,  hip, knee and ankles bilaterally.  Wrist: Left Inspection arthritic changes of multiple joints ROM smooth and normal with good flexion and extension and ulnar/radial deviation that is symmetrical with opposite wrist. Pain over the TFCC No snuffbox tenderness. No tenderness over Canal of [place]yon. Strength 4/5 in all directions without pain. Positive Finkelstein, tinel's and phalens. Negative Watson's test.  Procedure: Real-time Ultrasound Guided Injection of left  TFCC Device: GE Logiq Q7 Ultrasound guided injection is preferred based studies that show increased duration, increased effect, greater accuracy, decreased procedural pain, increased response rate, and decreased cost with ultrasound guided versus blind injection.  Verbal informed consent obtained.  Time-out conducted.  Noted no overlying erythema, induration, or other signs of local infection.  Skin prepped in a sterile fashion.  Local anesthesia: Topical Ethyl chloride.  With sterile technique and under real time ultrasound guidance: 25-gauge half inch needle injected with 0.5 cc of 0.5% Marcaine 0.5 cc of Kenalog 40 mg/mL Completed without difficulty  Pain immediately resolved suggesting accurate placement of the medication.  Advised to call if fevers/chills, erythema, induration, drainage, or persistent bleeding.  Images permanently stored and available for review in the ultrasound unit.  Impression: Technically successful ultrasound guided injection.    Impression and Recommendations:  This case required medical decision making of moderate complexity. The above documentation has been reviewed and is accurate and complete Edwin Pulley, DO       Note: This dictation was prepared with Dragon dictation along with smaller phrase technology. Any transcriptional errors that result from this process are unintentional.

## 2018-04-26 ENCOUNTER — Ambulatory Visit (INDEPENDENT_AMBULATORY_CARE_PROVIDER_SITE_OTHER): Payer: 59 | Admitting: Family Medicine

## 2018-04-26 ENCOUNTER — Encounter: Payer: Self-pay | Admitting: Family Medicine

## 2018-04-26 ENCOUNTER — Ambulatory Visit: Payer: Self-pay

## 2018-04-26 VITALS — BP 150/90 | HR 86 | Ht 68.0 in | Wt 187.0 lb

## 2018-04-26 DIAGNOSIS — S63592D Other specified sprain of left wrist, subsequent encounter: Secondary | ICD-10-CM

## 2018-04-26 DIAGNOSIS — G5602 Carpal tunnel syndrome, left upper limb: Secondary | ICD-10-CM

## 2018-04-26 DIAGNOSIS — M654 Radial styloid tenosynovitis [de Quervain]: Secondary | ICD-10-CM | POA: Diagnosis not present

## 2018-04-26 DIAGNOSIS — M25532 Pain in left wrist: Secondary | ICD-10-CM | POA: Diagnosis not present

## 2018-04-26 NOTE — Assessment & Plan Note (Signed)
Patient has many different problems with this wrist.  Did not respond much in the carpal tunnel and the de Quervain's injection.  TFCC injection given today, bracing given today as well that I think will be beneficial with the repetitive motions.  X-rays to further evaluate the posttraumatic arthritis that patient has had and see how he does getting worse.  We discussed the possibility of MRIs.  Follow-up again in 4 to 6 weeks

## 2018-04-26 NOTE — Patient Instructions (Signed)
Good to see you  Tried TFCC injection  I hope it helps Xray downstairs Stay active but try the brace Tell me how the elixir  See me again in 4-6 weeks

## 2018-05-29 ENCOUNTER — Ambulatory Visit: Payer: 59 | Admitting: Family Medicine

## 2019-01-30 NOTE — Progress Notes (Signed)
[name] [place], [place] [address] Phone: [phone] Subjective:   I Edwin Brown am serving as a Education administrator for Dr. Hulan Saas.  This visit occurred during the SARS-CoV-2 public health emergency.  Safety protocols were in place, including screening questions prior to the visit, additional usage of staff PPE, and extensive cleaning of exam room while observing appropriate contact time as indicated for disinfecting solutions.    CC: Left wrist pain follow-up new onset right shoulder pain and right knee pain  [id number]   04/26/2018 Patient has many different problems with this wrist.  Did not respond much in the carpal tunnel and the de Quervain's injection.  TFCC injection given today, bracing given today as well that I think will be beneficial with the repetitive motions.  X-rays to further evaluate the posttraumatic arthritis that patient has had and see how he does getting worse.  We discussed the possibility of MRIs.  Follow-up again in 4 to 6 weeks  01/31/2019 Edwin Brown is a 63 y.o. male coming in with complaint of left wrist pain. Right shoulder pain. Left knee pain. Painful in the morning and the knee pops. Knee weakness with weight bearing. Injection in shoulder 10+ years ago.  Patient feels the injection had been helpful previously.  Had a from an outside facility.  Patient states the left knee hurts more on the lateral aspect.  Rates the severity of pain is 8 out of 10  Onset- chronic knee pain Location - lateral knee pain    Patient on the left side of the wrist has had a TFCC tear.  Had an injection approximately 10 months ago.  Patient does use his hands a lot for work.    Past Medical History:  Diagnosis Date  . HTN (hypertension)   . Syncope    Past Surgical History:  Procedure Laterality Date  . APPENDECTOMY    . CHOLECYSTECTOMY N/A 01/30/2014   Procedure: LAPAROSCOPIC CHOLECYSTECTOMY WITH INTRAOPERATIVE  CHOLANGIOGRAM;  Surgeon: Pedro Earls, MD;  Location: [facility];  Service: General;  Laterality: N/A;  . NASAL SINUS SURGERY    . VASECTOMY     Social History   Socioeconomic History  . Marital status: Married    Spouse name: Not on file  . Number of children: 3  . Years of education: Not on file  . Highest education level: Not on file  Occupational History  . Occupation: Insurance underwriter  Social Needs  . Financial resource strain: Not on file  . Food insecurity    Worry: Not on file    Inability: Not on file  . Transportation needs    Medical: Not on file    Non-medical: Not on file  Tobacco Use  . Smoking status: Never Smoker  . Smokeless tobacco: Never Used  Substance and Sexual Activity  . Alcohol use: Yes    Comment: ONCE A DAY   . Drug use: No  . Sexual activity: Not on file  Lifestyle  . Physical activity    Days per week: Not on file    Minutes per session: Not on file  . Stress: Not on file  Relationships  . Social Herbalist on phone: Not on file    Gets together: Not on file    Attends religious service: Not on file    Active member of club or organization: Not on file    Attends meetings of clubs or organizations: Not on  file    Relationship status: Not on file  Other Topics Concern  . Not on file  Social History Narrative  . Not on file   Allergies  Allergen Reactions  . Dilaudid [Hydromorphone Hcl] Shortness Of Breath  . Phenergan [Promethazine Hcl] Other (See Comments)    Shaky, very out of sorts   . Other     HORSE SERUM TETANUS ANTI TOX : PATIENT GETS HIGH FEVER   Family History  Problem Relation Age of Onset  . Heart attack Father   . Heart attack Brother      Current Outpatient Medications (Cardiovascular):  .  losartan (COZAAR) 100 MG tablet, Take 100 mg by mouth daily.  Current Outpatient Medications (Respiratory):  .  albuterol (PROVENTIL HFA;VENTOLIN HFA) 108 (90 BASE) MCG/ACT inhaler, Inhale 1 puff into the lungs every 6  (six) hours as needed for wheezing or shortness of breath. .  loratadine (CLARITIN) 10 MG tablet, Take 10 mg by mouth daily as needed for allergies.  Current Outpatient Medications (Analgesics):  .  traMADol (ULTRAM) 50 MG tablet, Take 50 mg by mouth every 4 (four) hours as needed for moderate pain.   Current Outpatient Medications (Other):  Marland Kitchen  ALPRAZolam (XANAX) 0.25 MG tablet, Take 0.25 mg by mouth at bedtime as needed for anxiety. .  clarithromycin (BIAXIN) 500 MG tablet, Take 500 mg by mouth 2 (two) times daily. For 10 days .  Diclofenac Sodium 2 % SOLN, Apply 1 pump twice daily. [name] (GLUCOSAMINE MSM COMPLEX PO), Take 2 capsules by mouth daily.  .  Turmeric Curcumin 500 MG CAPS, Take 1,000 mg by mouth daily.    Past medical history, social, surgical and family history all reviewed in electronic medical record.  No pertanent information unless stated regarding to the chief complaint.   Review of Systems:  No headache, visual changes, nausea, vomiting, diarrhea, constipation, dizziness, abdominal pain, skin rash, fevers, chills, night sweats, weight loss, swollen lymph nodes, body aches, joint swelling, , chest pain, shortness of breath, mood changes.  Positive muscle aches  Objective  Blood pressure 140/70, pulse 97, height 5\' 8"  (1.727 m), weight 187 lb (84.8 kg), SpO2 98 %.   General: No apparent distress alert and oriented x3 mood and affect normal, dressed appropriately.  HEENT: Pupils equal, extraocular movements intact  Respiratory: Patient's speak in full sentences and does not appear short of breath  Cardiovascular: No lower extremity edema, non tender, no erythema  Skin: Warm dry intact with no signs of infection or rash on extremities or on axial skeleton.  Abdomen: Soft nontender  Neuro: Cranial nerves II through XII are intact, neurovascularly intact in all extremities with 2+ DTRs and 2+ pulses.  Lymph: No lymphadenopathy of posterior or  anterior cervical chain or axillae bilaterally.  Gait normal with good balance and coordination.  MSK:  Non tender with full range of motion and good stability and symmetric strength and tone o felbows, , hip and ankles bilaterally.  Mild benign tremor noted of the right upper extremity Shoulder: Right Inspection reveals no abnormalities, atrophy or asymmetry. Palpation is normal with no tenderness over AC joint or bicipital groove. ROM is full in all planes passively. Rotator cuff strength normal throughout. signs of impingement with positive Neer and Hawkin's tests, but negative empty can sign. Speeds and Yergason's tests normal. No labral pathology noted with negative Obrien's, negative clunk and good stability. Normal scapular function observed. No painful arc and no drop arm sign. No apprehension sign  Contralateral shoulder unremarkable  Left knee shows the patient does have some pain over the lateral side of the knee.  Positive McMurray's.  Negative straight leg test mild patella grind test.  Left wrist exam shows pain with Watson test.  Patient has pain over the TFCC.  Arthritic changes of the wrist noted as well as synovitis of the MCP joints.  MSK US performed of: Right This study was ordered, performed, and interpreted by Charlann Boxer D.O.  Shoulder:   Supraspinatus:  Appears normal on long and transverse views, Bursal bulge seen with shoulder abduction on impingement view. Infraspinatus:  Appears normal on long and transverse views. Significant increase in Doppler flow Subscapularis:  Appears normal on long and transverse views. Positive bursa Teres Minor:  Appears normal on long and transverse views. AC joint:  Capsule undistended, no geyser sign. Glenohumeral Joint:  Appears normal without effusion. Glenoid Labrum:  Intact without visualized tears. Biceps Tendon:  Appears normal on long and transverse views, no fraying of tendon, tendon located in intertubercular groove, no  subluxation with shoulder internal or external rotation.  Impression: Subacromial bursitis  Procedure: Real-time Ultrasound Guided Injection of right glenohumeral joint Device: GE Logiq E  Ultrasound guided injection is preferred based studies that show increased duration, increased effect, greater accuracy, decreased procedural pain, increased response rate with ultrasound guided versus blind injection.  Verbal informed consent obtained.  Time-out conducted.  Noted no overlying erythema, induration, or other signs of local infection.  Skin prepped in a sterile fashion.  Local anesthesia: Topical Ethyl chloride.  With sterile technique and under real time ultrasound guidance:  Joint visualized.  23g 1  inch needle inserted posterior approach. Pictures taken for needle placement. Patient did have injection of 2 cc of 1% lidocaine, 2 cc of 0.5% Marcaine, and 1.0 cc of Kenalog 40 mg/dL. Completed without difficulty  Pain immediately resolved suggesting accurate placement of the medication.  Advised to call if fevers/chills, erythema, induration, drainage, or persistent bleeding.  Images permanently stored and available for review in the ultrasound unit.  Impression: Technically successful ultrasound guided injection.  Procedure: Real-time Ultrasound Guided Injection of left TFCC Device: GE Logiq Q7 Ultrasound guided injection is preferred based studies that show increased duration, increased effect, greater accuracy, decreased procedural pain, increased response rate, and decreased cost with ultrasound guided versus blind injection.  Verbal informed consent obtained.  Time-out conducted.  Noted no overlying erythema, induration, or other signs of local infection.  Skin prepped in a sterile fashion.  Local anesthesia: Topical Ethyl chloride.  With sterile technique and under real time ultrasound guidance: With a 25-gauge half inch needle injected with 0.5 cc of 0.5% Marcaine and 0.5 cc of  Kenalog 40 mg/mL Completed without difficulty  Pain immediately resolved suggesting accurate placement of the medication.  Advised to call if fevers/chills, erythema, induration, drainage, or persistent bleeding.  Images permanently stored and available for review in the ultrasound unit.  Impression: Technically successful ultrasound guided injection.  After informed written and verbal consent, patient was seated on exam table. Left knee was prepped with alcohol swab and utilizing anterolateral approach, patient's left knee space was injected with 4:1  marcaine 0.5%: Kenalog 40mg /dL. Patient tolerated the procedure well without immediate complications.   Impression and Recommendations:     This case required medical decision making of moderate complexity. The above documentation has been reviewed and is accurate and complete [name], DO       Note: This dictation was prepared with  Dragon dictation along with smaller Company secretary. Any transcriptional errors that result from this process are unintentional.

## 2019-01-31 ENCOUNTER — Ambulatory Visit (INDEPENDENT_AMBULATORY_CARE_PROVIDER_SITE_OTHER)
Admission: RE | Admit: 2019-01-31 | Discharge: 2019-01-31 | Disposition: A | Discharge status: Home / Self Care | Payer: Self-pay | Source: Ambulatory Visit | Attending: Family Medicine | Admitting: Family Medicine

## 2019-01-31 ENCOUNTER — Other Ambulatory Visit: Payer: Self-pay

## 2019-01-31 ENCOUNTER — Encounter: Payer: Self-pay | Admitting: Family Medicine

## 2019-01-31 ENCOUNTER — Ambulatory Visit: Payer: Self-pay

## 2019-01-31 ENCOUNTER — Ambulatory Visit (INDEPENDENT_AMBULATORY_CARE_PROVIDER_SITE_OTHER): Payer: PRIVATE HEALTH INSURANCE | Admitting: Family Medicine

## 2019-01-31 VITALS — BP 140/70 | HR 97 | Ht 68.0 in | Wt 187.0 lb

## 2019-01-31 DIAGNOSIS — S63592D Other specified sprain of left wrist, subsequent encounter: Secondary | ICD-10-CM | POA: Diagnosis not present

## 2019-01-31 DIAGNOSIS — M25511 Pain in right shoulder: Secondary | ICD-10-CM | POA: Diagnosis not present

## 2019-01-31 DIAGNOSIS — M25562 Pain in left knee: Secondary | ICD-10-CM

## 2019-01-31 DIAGNOSIS — G8929 Other chronic pain: Secondary | ICD-10-CM

## 2019-01-31 DIAGNOSIS — M7551 Bursitis of right shoulder: Secondary | ICD-10-CM

## 2019-01-31 DIAGNOSIS — M19032 Primary osteoarthritis, left wrist: Secondary | ICD-10-CM

## 2019-01-31 IMAGING — DX DG KNEE COMPLETE 4+V*L*
4 series · 4 of 4 positions shown · non-contrast
Comparison: None.

CLINICAL DATA: Knee pain

EXAM:
LEFT KNEE - COMPLETE 4+ VIEW

[knee ap]
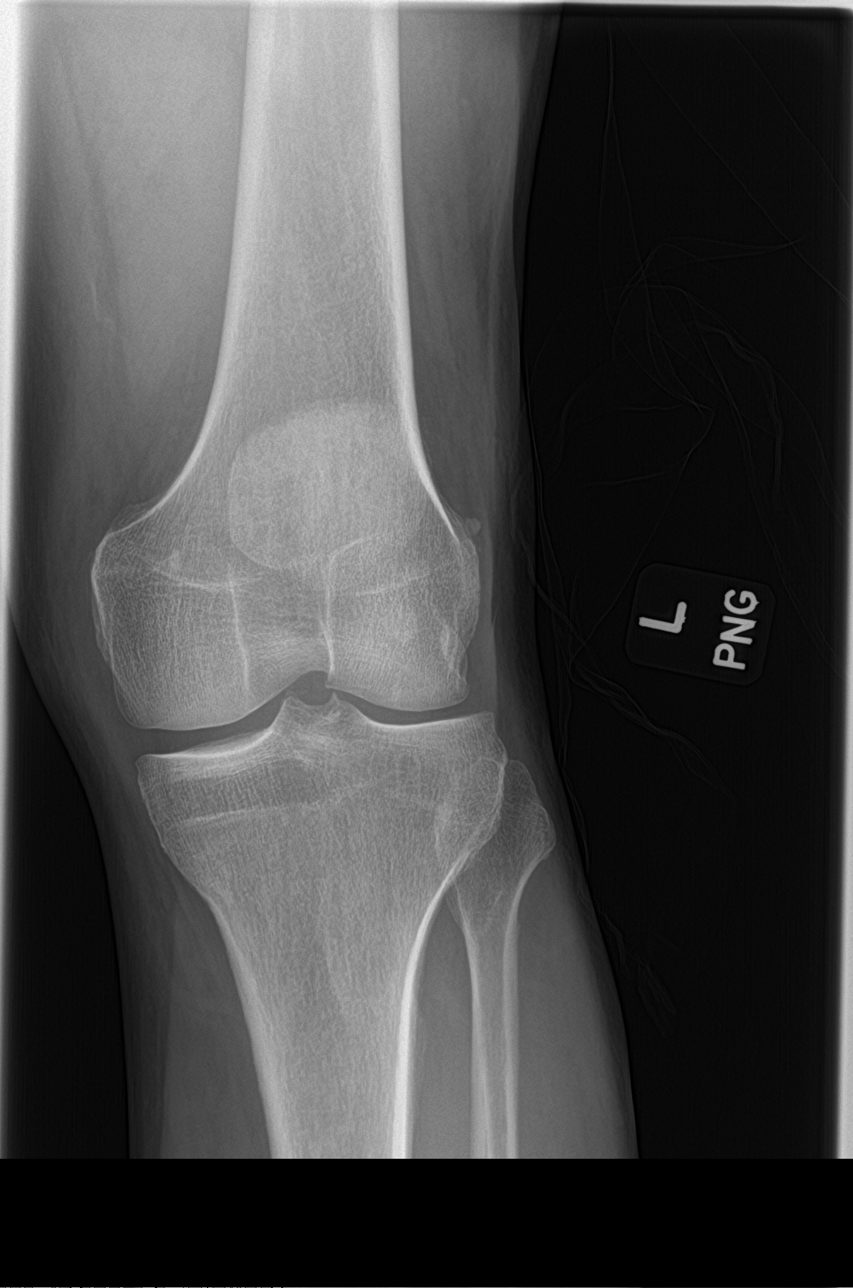

[knee tunnel]
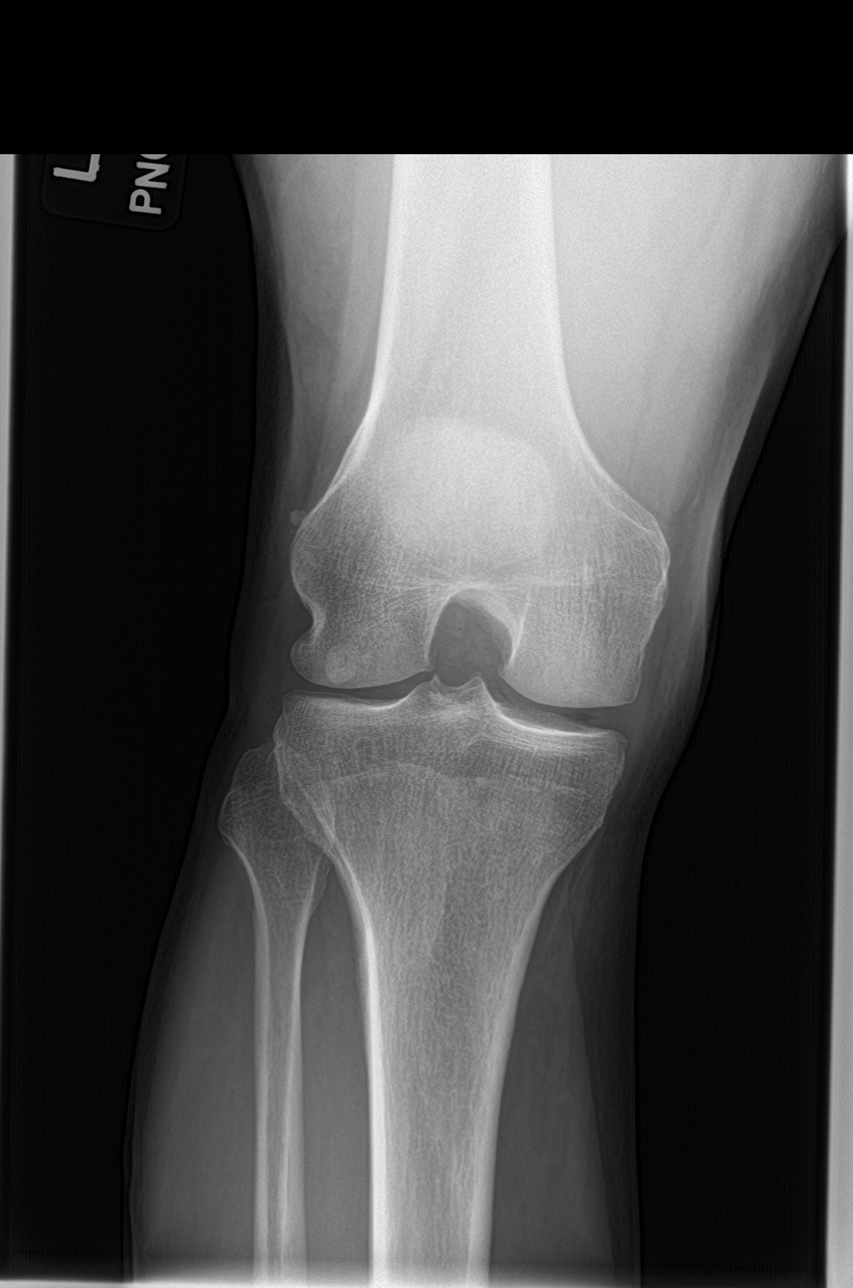

[knee lat]
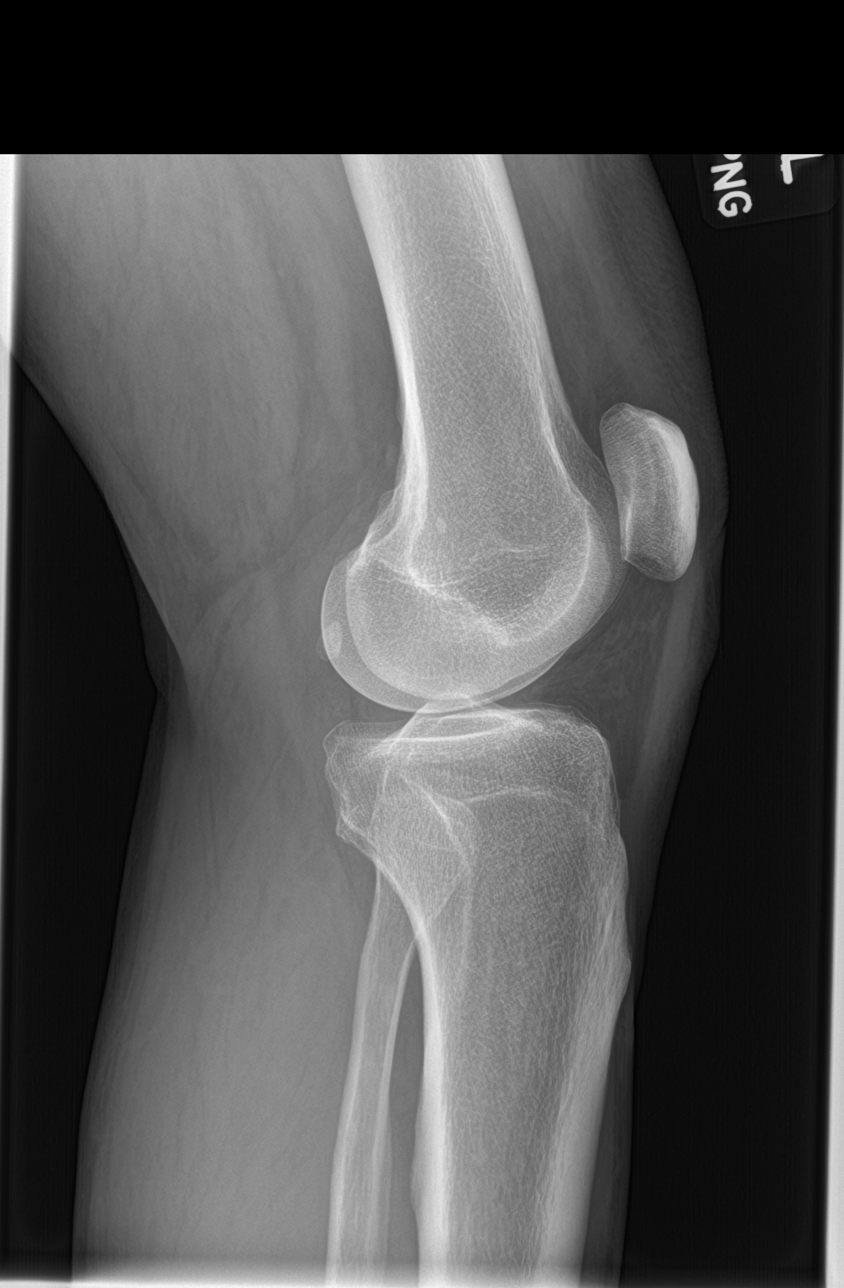

[sunrise]
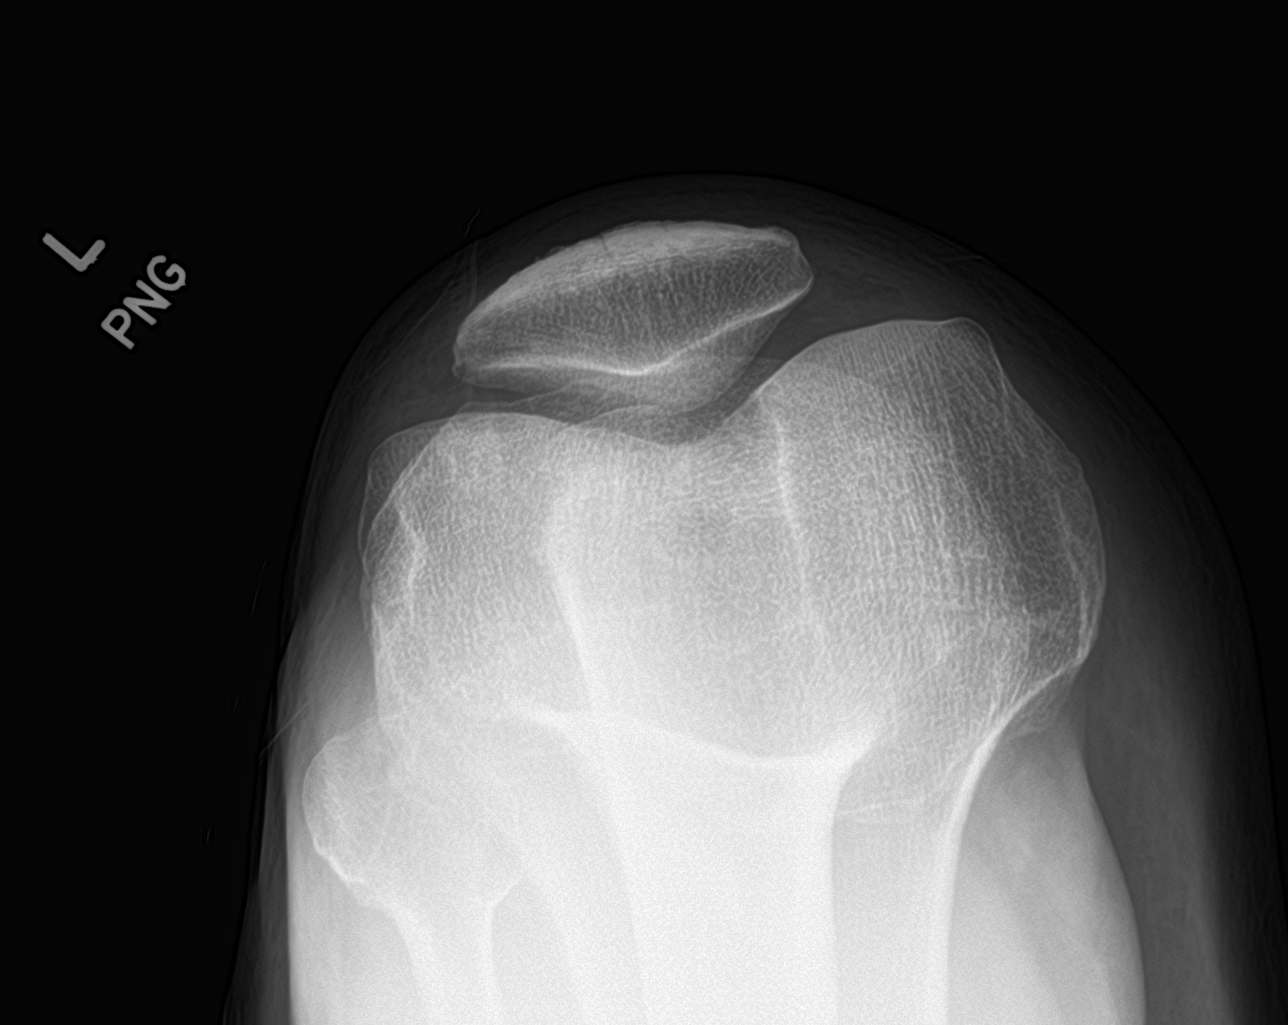

[4 of 4 positions shown; findings below may reference images not displayed]

FINDINGS: No evidence of fracture, dislocation, or joint effusion. No evidence
of arthropathy or other focal bone abnormality. Soft tissues are
unremarkable.
IMPRESSION: Negative.

## 2019-01-31 NOTE — Patient Instructions (Signed)
See me again in 5 -6 weeks Exercise 3 times a week

## 2019-02-01 ENCOUNTER — Encounter: Payer: Self-pay | Admitting: Family Medicine

## 2019-02-01 DIAGNOSIS — M25562 Pain in left knee: Secondary | ICD-10-CM | POA: Insufficient documentation

## 2019-02-01 DIAGNOSIS — M7551 Bursitis of right shoulder: Secondary | ICD-10-CM | POA: Insufficient documentation

## 2019-02-01 NOTE — Assessment & Plan Note (Signed)
Patient given injection, discussed which activities to do which wants to avoid.  Discussed icing regimen and home exercises.  Discussed keeping hands within peripheral vision.  Follow-up again in 4 to 6 weeks

## 2019-02-01 NOTE — Assessment & Plan Note (Signed)
Repeat injection given today.  Tolerated the procedure well.  Patient also has underlying carpal tunnel in the area that could be contributing as well.  Discussed icing regimen.  Discussed topical anti-inflammatories.  Discussed the possibility of nerve conduction test.  Patient also given a handout about PRP.

## 2019-02-01 NOTE — Assessment & Plan Note (Signed)
Left knee does have signs and symptoms more consistent with mild arthritic changes as well as likely a meniscal tear.  Home exercises given, topical anti-inflammatories, discussed potential bracing.  Responded well to injection even walking out.

## 2019-02-21 ENCOUNTER — Other Ambulatory Visit: Payer: Self-pay

## 2019-02-21 ENCOUNTER — Encounter: Payer: Self-pay | Admitting: Family Medicine

## 2019-02-21 ENCOUNTER — Ambulatory Visit (INDEPENDENT_AMBULATORY_CARE_PROVIDER_SITE_OTHER): Payer: PRIVATE HEALTH INSURANCE | Admitting: Family Medicine

## 2019-02-21 DIAGNOSIS — G8929 Other chronic pain: Secondary | ICD-10-CM | POA: Diagnosis not present

## 2019-02-21 DIAGNOSIS — M999 Biomechanical lesion, unspecified: Secondary | ICD-10-CM | POA: Diagnosis not present

## 2019-02-21 DIAGNOSIS — M542 Cervicalgia: Secondary | ICD-10-CM | POA: Diagnosis not present

## 2019-02-21 NOTE — Patient Instructions (Signed)
  [address] [place], [place] [address] Phone [phone]

## 2019-02-21 NOTE — Assessment & Plan Note (Signed)
Chronic neck pain.  Discussed with patient on posture ergonomics, which activities to do which was to avoid.  Discussed which activities to do which wants to avoid.  Discussed icing regimen.  Follow-up again in 4 to 8 weeks.

## 2019-02-21 NOTE — Assessment & Plan Note (Signed)
Decision today to treat with OMT was based on Physical Exam  After verbal consent patient was treated with HVLA, ME, FPR techniques in cervical, thoracic, rib,  lumbar and sacral areas  Patient tolerated the procedure well with improvement in symptoms  Patient given exercises, stretches and lifestyle modifications  See medications in patient instructions if given  Patient will follow up in 4-8 weeks 

## 2019-02-21 NOTE — Progress Notes (Signed)
[name] [place],  [address] Phone: [phone] Subjective:   Edwin [name], am serving as a scribe for Dr. Hulan Saas. This visit occurred during the SARS-CoV-2 public health emergency.  Safety protocols were in place, including screening questions prior to the visit, additional usage of staff PPE, and extensive cleaning of exam room while observing appropriate contact time as indicated for disinfecting solutions.  CC: R shoulder, L wrist and L knee  [id number]    01/31/19: L knee: Left knee does have signs and symptoms more consistent with mild arthritic changes as well as likely a meniscal tear.  Home exercises given, topical anti-inflammatories, discussed potential bracing.  Responded well to injection even walking out.  R shoulder: Patient given injection, discussed which activities to do which wants to avoid.  Discussed icing regimen and home exercises.  Discussed keeping hands within peripheral vision.  Follow-up again in 4 to 6 weeksR shoulder:  L wrist: Repeat injection given today.  Tolerated the procedure well.  Patient also has underlying carpal tunnel in the area that could be contributing as well.  Discussed icing regimen.  Discussed topical anti-inflammatories.  Discussed the possibility of nerve conduction test.  Patient also given a handout about PRP.   02/21/19: Edwin [name] is a 63 y.o. male coming in with complaint of knee, shoulder and wrist pain. Patient states that the injections helped to decrease his pain. Would like to get manipulation today as this helped to decrease his back pain.  Patient has though still some intermittent pain when it seems to be significant enough to stop him from some activities.    Past Medical History:  Diagnosis Date  . HTN (hypertension)   . Syncope    Past Surgical History:  Procedure Laterality Date  . APPENDECTOMY    . CHOLECYSTECTOMY N/A 01/30/2014   Procedure:  LAPAROSCOPIC CHOLECYSTECTOMY WITH INTRAOPERATIVE CHOLANGIOGRAM;  Surgeon: Edwin Earls, MD;  Location: [facility];  Service: General;  Laterality: N/A;  . NASAL SINUS SURGERY    . VASECTOMY     Social History   Socioeconomic History  . Marital status: Married    Spouse name: Not on file  . Number of children: 3  . Years of education: Not on file  . Highest education level: Not on file  Occupational History  . Occupation: Insurance underwriter  Tobacco Use  . Smoking status: Never Smoker  . Smokeless tobacco: Never Used  Substance and Sexual Activity  . Alcohol use: Yes    Comment: ONCE A DAY   . Drug use: [name]  . Sexual activity: Not on file  Other Topics Concern  . Not on file  Social History Narrative  . Not on file   Social Determinants of Health   Financial Resource Strain:   . Difficulty of Paying Living Expenses: Not on file  Food Insecurity:   . Worried About Charity fundraiser in the Last Year: Not on file  . Ran Out of Food in the Last Year: Not on file  Transportation Needs:   . Lack of Transportation (Medical): Not on file  . Lack of Transportation (Non-Medical): Not on file  Physical Activity:   . Days of Exercise per Week: Not on file  . Minutes of Exercise per Session: Not on file  Stress:   . Feeling of Stress : Not on file  Social Connections:   . Frequency of Communication with Friends and Family: Not on file  .  Frequency of Social Gatherings with Friends and Family: Not on file  . Attends Religious Services: Not on file  . Active Member of Clubs or Organizations: Not on file  . Attends Archivist Meetings: Not on file  . Marital Status: Not on file   Allergies  Allergen Reactions  . Dilaudid [Hydromorphone Hcl] Shortness Of Breath  . Phenergan [Promethazine Hcl] Other (See Comments)    Shaky, very out of sorts   . Other     HORSE SERUM TETANUS ANTI TOX : PATIENT GETS HIGH FEVER   Family History  Problem Relation Age of Onset  . Heart attack  Father   . Heart attack Brother      Current Outpatient Medications (Cardiovascular):  .  losartan (COZAAR) 100 MG tablet, Take 100 mg by mouth daily.  Current Outpatient Medications (Respiratory):  .  albuterol (PROVENTIL HFA;VENTOLIN HFA) 108 (90 BASE) MCG/ACT inhaler, Inhale 1 puff into the lungs every 6 (six) hours as needed for wheezing or shortness of breath. .  loratadine (CLARITIN) 10 MG tablet, Take 10 mg by mouth daily as needed for allergies.  Current Outpatient Medications (Analgesics):  .  traMADol (ULTRAM) 50 MG tablet, Take 50 mg by mouth every 4 (four) hours as needed for moderate pain.   Current Outpatient Medications (Other):  Marland Kitchen  ALPRAZolam (XANAX) 0.25 MG tablet, Take 0.25 mg by mouth at bedtime as needed for anxiety. .  clarithromycin (BIAXIN) 500 MG tablet, Take 500 mg by mouth 2 (two) times daily. For 10 days .  Diclofenac Sodium 2 % SOLN, Apply 1 pump twice daily. Edwin [name] (GLUCOSAMINE MSM COMPLEX PO), Take 2 capsules by mouth daily.  .  Turmeric Curcumin 500 MG CAPS, Take 1,000 mg by mouth daily.    Past medical history, social, surgical and family history all reviewed in electronic medical record.  [name] pertanent information unless stated regarding to the chief complaint.   Review of Systems:  [name] headache, visual changes, nausea, vomiting, diarrhea, constipation, dizziness, abdominal pain, skin rash, fevers, chills, night sweats, weight loss, swollen lymph nodes, body aches, joint swelling, chest pain, shortness of breath, mood changes.  Positive muscle aches  Objective  Blood pressure 110/74, pulse 98, height 5\' 8"  (1.727 m), weight 189 lb (85.7 kg), SpO2 98 %.   General: [name] apparent distress alert and oriented x3 mood and affect normal, dressed appropriately.  HEENT: Pupils equal, extraocular movements intact  Respiratory: Patient's speak in full sentences and does not appear short of breath  Cardiovascular: [name] lower extremity  edema, non tender, [name] erythema  Skin: Warm dry intact with [name] signs of infection or rash on extremities or on axial skeleton.  Abdomen: Soft nontender  Neuro: Cranial nerves II through XII are intact, neurovascularly intact in all extremities with 2+ DTRs and 2+ pulses.  Lymph: [name] lymphadenopathy of posterior or anterior cervical chain or axillae bilaterally.  Gait normal with good balance and coordination.  MSK:  tender with full range of motion and good stability and symmetric strength and tone of shoulders, elbows, wrist, hip, knee and ankles bilaterally.  Significant arthritic changes of multiple joints Back exam shows mild loss of lordosis.  Some tender to palpation in the paraspinal musculature lumbar spine right greater than left.  Patient does have tightness with Corky Sox right greater than left as well.  Osteopathic findings C2 flexed rotated and side bent rightt T7 extended rotated and side bent right inhaled rib T10 extended rotated and side bent left L1  flexed rotated and side bent right Sacrum right on right    Impression and Recommendations:     This case required medical decision making of moderate complexity. The above documentation has been reviewed and is accurate and complete [name], DO       Note: This dictation was prepared with Dragon dictation along with smaller phrase technology. Any transcriptional errors that result from this process are unintentional.

## 2019-04-04 ENCOUNTER — Ambulatory Visit: Payer: PRIVATE HEALTH INSURANCE | Admitting: Family Medicine

## 2019-06-02 NOTE — Progress Notes (Signed)
[place] [address] [place] Phone: [phone] Subjective:   I Edwin Brown am serving as a Education administrator for Dr. Hulan Saas.  This visit occurred during the SARS-CoV-2 public health emergency.  Safety protocols were in place, including screening questions prior to the visit, additional usage of staff PPE, and extensive cleaning of exam room while observing appropriate contact time as indicated for disinfecting solutions.   I'm seeing this patient by the request  of:  [name], MD  CC: Wrist pain follow-up  [id number]   02/21/2019 Chronic neck pain.  Discussed with patient on posture ergonomics, which activities to do which was to avoid.  Discussed which activities to do which wants to avoid.  Discussed icing regimen.  Follow-up again in 4 to 8 weeks.  Update 06/03/2019 Edwin Brown is a 65 y.o. male coming in with complaint of wrist pain. Last seen for OMT. Is here for wrist injection today. Patient states that he would like to talk about long term plans.  Patient knows that he will need surgical intervention at some point secondary to the underlying arthritis, TFCC tear as well and potential loose body.  Patient does not want to do that anytime soon and is hoping that some type of bracing could also give him more long-term improvement.       Past Medical History:  Diagnosis Date  . HTN (hypertension)   . Syncope    Past Surgical History:  Procedure Laterality Date  . APPENDECTOMY    . CHOLECYSTECTOMY N/A 01/30/2014   Procedure: LAPAROSCOPIC CHOLECYSTECTOMY WITH INTRAOPERATIVE CHOLANGIOGRAM;  Surgeon: Pedro Earls, MD;  Location: [facility];  Service: General;  Laterality: N/A;  . NASAL SINUS SURGERY    . VASECTOMY     Social History   Socioeconomic History  . Marital status: Married    Spouse name: Not on file  . Number of children: 3  . Years of education: Not on file  . Highest education level: Not on file    Occupational History  . Occupation: Insurance underwriter  Tobacco Use  . Smoking status: Never Smoker  . Smokeless tobacco: Never Used  Substance and Sexual Activity  . Alcohol use: Yes    Comment: ONCE A DAY   . Drug use: No  . Sexual activity: Not on file  Other Topics Concern  . Not on file  Social History Narrative  . Not on file   Social Determinants of Health   Financial Resource Strain:   . Difficulty of Paying Living Expenses:   Food Insecurity:   . Worried About Charity fundraiser in the Last Year:   . Arboriculturist in the Last Year:   Transportation Needs:   . Film/video editor (Medical):   Marland Kitchen Lack of Transportation (Non-Medical):   Physical Activity:   . Days of Exercise per Week:   . Minutes of Exercise per Session:   Stress:   . Feeling of Stress :   Social Connections:   . Frequency of Communication with Friends and Family:   . Frequency of Social Gatherings with Friends and Family:   . Attends Religious Services:   . Active Member of Clubs or Organizations:   . Attends Archivist Meetings:   Marland Kitchen Marital Status:    Allergies  Allergen Reactions  . Dilaudid [Hydromorphone Hcl] Shortness Of Breath  . Phenergan [Promethazine Hcl] Other (See Comments)    Shaky, very out of sorts   .  Other     HORSE SERUM TETANUS ANTI TOX : PATIENT GETS HIGH FEVER   Family History  Problem Relation Age of Onset  . Heart attack Father   . Heart attack Brother      Current Outpatient Medications (Cardiovascular):  .  losartan (COZAAR) 100 MG tablet, Take 100 mg by mouth daily.  Current Outpatient Medications (Respiratory):  .  albuterol (PROVENTIL HFA;VENTOLIN HFA) 108 (90 BASE) MCG/ACT inhaler, Inhale 1 puff into the lungs every 6 (six) hours as needed for wheezing or shortness of breath. .  loratadine (CLARITIN) 10 MG tablet, Take 10 mg by mouth daily as needed for allergies.  Current Outpatient Medications (Analgesics):  .  traMADol (ULTRAM) 50 MG tablet,  Take 50 mg by mouth every 4 (four) hours as needed for moderate pain.   Current Outpatient Medications (Other):  Marland Kitchen  ALPRAZolam (XANAX) 0.25 MG tablet, Take 0.25 mg by mouth at bedtime as needed for anxiety. .  clarithromycin (BIAXIN) 500 MG tablet, Take 500 mg by mouth 2 (two) times daily. For 10 days .  Diclofenac Sodium 2 % SOLN, Apply 1 pump twice daily. [name] (GLUCOSAMINE MSM COMPLEX PO), Take 2 capsules by mouth daily.  .  Turmeric Curcumin 500 MG CAPS, Take 1,000 mg by mouth daily. .  AMBULATORY NON FORMULARY MEDICATION, Left wrist brace   Reviewed prior external information including notes and imaging from  primary care provider As well as notes that were available from care everywhere and other healthcare systems.  Past medical history, social, surgical and family history all reviewed in electronic medical record.  No pertanent information unless stated regarding to the chief complaint.   Review of Systems:  No headache, visual changes, nausea, vomiting, diarrhea, constipation, dizziness, abdominal pain, skin rash, fevers, chills, night sweats, weight loss, swollen lymph nodes, body aches,, chest pain, shortness of breath, mood changes. POSITIVE muscle aches, joint swelling  Objective  Blood pressure 120/90, pulse 88, height 5\' 8"  (1.727 m), weight 187 lb (84.8 kg), SpO2 98 %.   General: No apparent distress alert and oriented x3 mood and affect normal, dressed appropriately.  HEENT: Pupils equal, extraocular movements intact  Respiratory: Patient's speak in full sentences and does not appear short of breath  Cardiovascular: No lower extremity edema, non tender, no erythema  Neuro: Cranial nerves II through XII are intact, neurovascularly intact in all extremities with 2+ DTRs and 2+ pulses.  Gait normal with good balance and coordination.  MSK: Left wrist exam shows the patient does have significant arthritic changes compared to the contralateral  side.  Swelling noted to the TFCC.  Positive Finkelstein's test.  Negative carpal tunnel signs with Tinel's today though.  Grip strength is still full.  Arthritic changes of the fingers noted as well.  Neck does have significant tightness noted.  Tender to palpation in the parascapular region right greater than left.  Patient does have good range of motion though of the scapulas bilaterally with no significant winging.  Tightness noted in the right sacroiliac joint.  Positive Faber on the right side.  Mild pain in the paraspinal musculature of the lumbar spine on the left  Osteopathic findings C2 flexed rotated and side bent right T3 extended rotated and side bent right inhaled third rib T9 extended rotated and side bent left L2 flexed rotated and side bent left Sacrum right on right   Procedure: Real-time Ultrasound Guided Injection of left Abductor pollicis longs tendon sheath Device: GE Logiq Q7  Ultrasound  guided injection is preferred based studies that show increased duration, increased effect, greater accuracy, decreased procedural pain, increased response rate with ultrasound guided versus blind injection.  Verbal informed consent obtained.  Time-out conducted.  Noted no overlying erythema, induration, or other signs of local infection.  Skin prepped in a sterile fashion.  Local anesthesia: Topical Ethyl chloride.  With sterile technique and under real time ultrasound guidance:  tendon visualized.  23g 5/8 inch needle inserted distal to proximal approach into tendon sheath. Pictures taken  for needle placement. Patient did have injection of 0.5 cc of of 0.5% Marcaine, and 0.5 cc of Kenalog 40 mg/dL. Completed without difficulty  Pain immediately resolved suggesting accurate placement of the medication.  Advised to call if fevers/chills, erythema, induration, drainage, or persistent bleeding.  Images permanently stored and available for review in the ultrasound unit.  Impression:  Technically successful ultrasound guided injection.  Procedure: Real-time Ultrasound Guided Injection of TFCC tear Device: GE Logiq Q7 Ultrasound guided injection is preferred based studies that show increased duration, increased effect, greater accuracy, decreased procedural pain, increased response rate, and decreased cost with ultrasound guided versus blind injection.  Verbal informed consent obtained.  Time-out conducted.  Noted no overlying erythema, induration, or other signs of local infection.  Skin prepped in a sterile fashion.  Local anesthesia: Topical Ethyl chloride.  With sterile technique and under real time ultrasound guidance: With a 25-gauge half inch needle injected with 0.5 cc of 0.5% Marcaine and 0.5 cc of Kenalog 40 mg/mL Completed without difficulty  Pain immediately resolved suggesting accurate placement of the medication.  Advised to call if fevers/chills, erythema, induration, drainage, or persistent bleeding.  Images permanently stored and available for review in the ultrasound unit.  Impression: Technically successful ultrasound guided injection.    Impression and Recommendations:     This case required medical decision making of moderate complexity. The above documentation has been reviewed and is accurate and complete [name], DO       Note: This dictation was prepared with Dragon dictation along with smaller phrase technology. Any transcriptional errors that result from this process are unintentional.

## 2019-06-03 ENCOUNTER — Ambulatory Visit (INDEPENDENT_AMBULATORY_CARE_PROVIDER_SITE_OTHER): Payer: Medicare Other

## 2019-06-03 ENCOUNTER — Encounter: Payer: Self-pay | Admitting: Family Medicine

## 2019-06-03 ENCOUNTER — Ambulatory Visit: Payer: Medicare Other | Admitting: Family Medicine

## 2019-06-03 ENCOUNTER — Other Ambulatory Visit: Payer: Self-pay

## 2019-06-03 VITALS — BP 120/90 | HR 88 | Ht 68.0 in | Wt 187.0 lb

## 2019-06-03 DIAGNOSIS — M654 Radial styloid tenosynovitis [de Quervain]: Secondary | ICD-10-CM | POA: Diagnosis not present

## 2019-06-03 DIAGNOSIS — M25532 Pain in left wrist: Secondary | ICD-10-CM

## 2019-06-03 DIAGNOSIS — S63592D Other specified sprain of left wrist, subsequent encounter: Secondary | ICD-10-CM

## 2019-06-03 DIAGNOSIS — M999 Biomechanical lesion, unspecified: Secondary | ICD-10-CM

## 2019-06-03 DIAGNOSIS — M19032 Primary osteoarthritis, left wrist: Secondary | ICD-10-CM | POA: Diagnosis not present

## 2019-06-03 IMAGING — DX DG WRIST COMPLETE 3+V*L*
4 series · 4 of 4 positions shown · non-contrast
Comparison: [DATE]

CLINICAL DATA: Generalized wrist pain common no known injury,
initial encounter

EXAM:
LEFT WRIST - COMPLETE 3+ VIEW

[wrist pa]
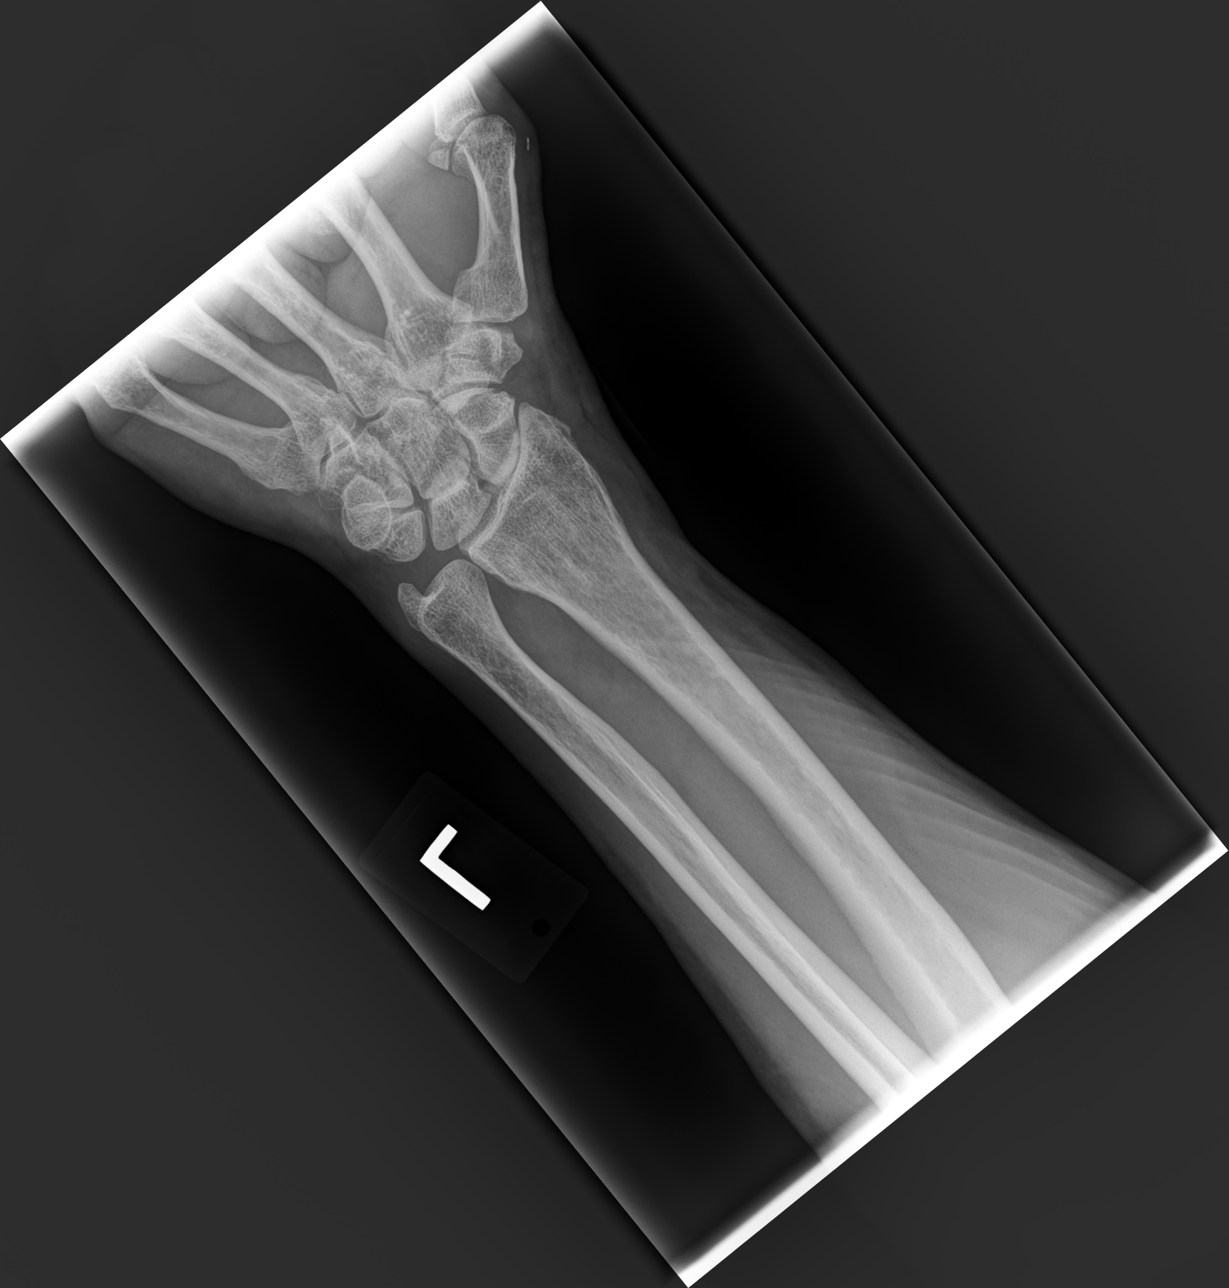

[wrist mlo]
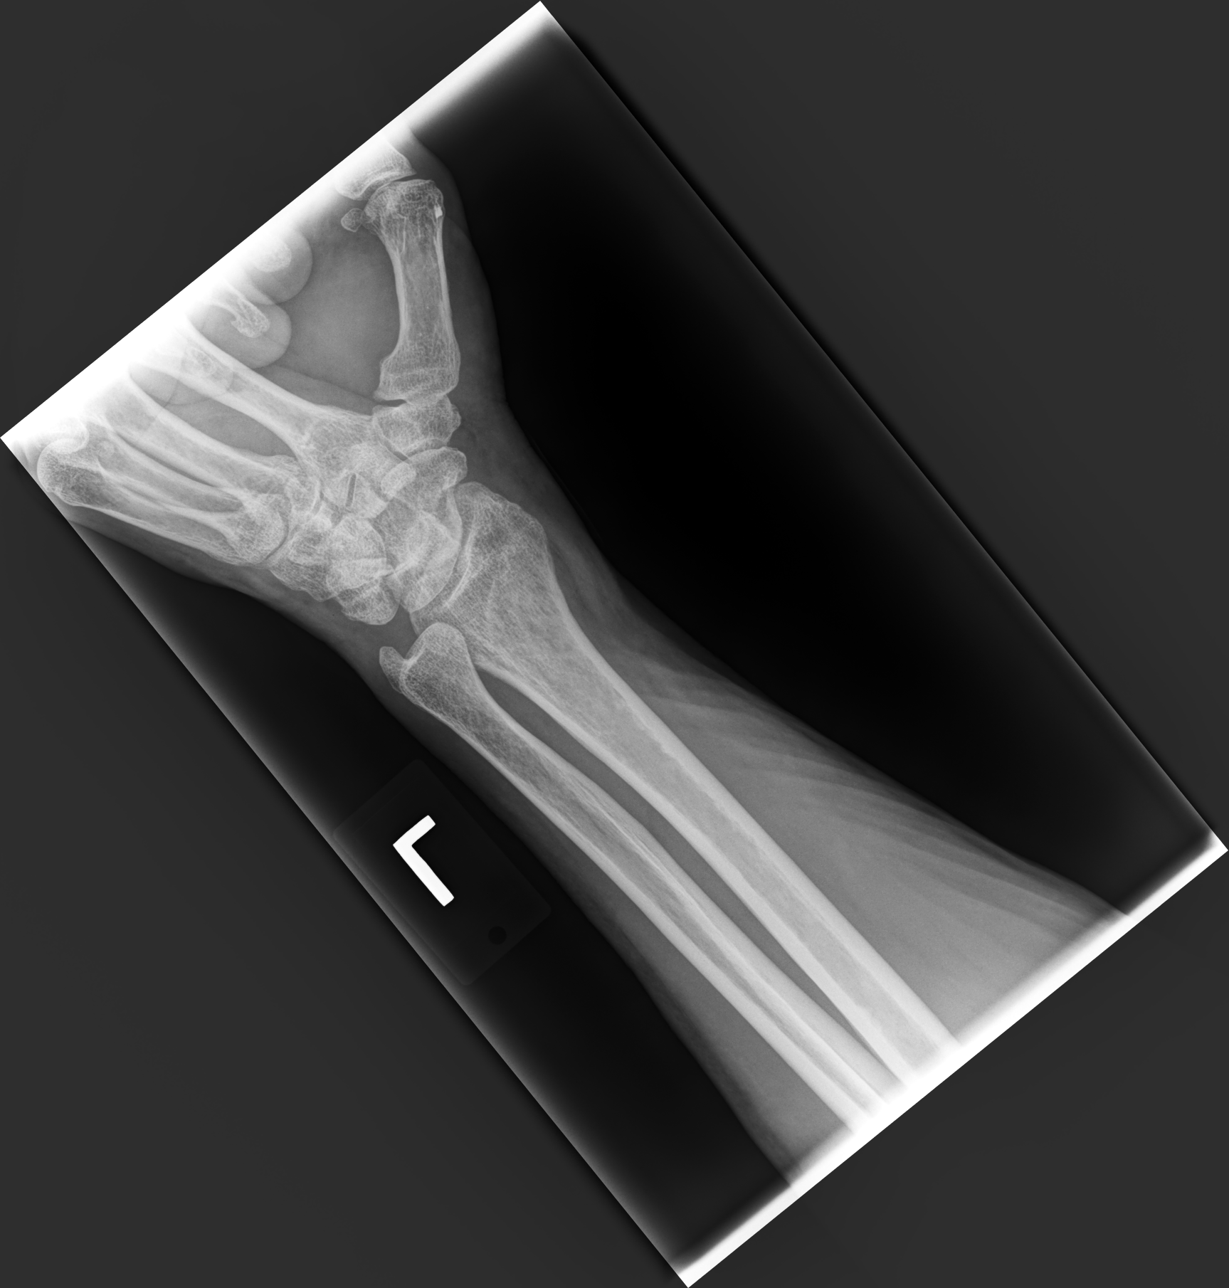

[wrist lat]
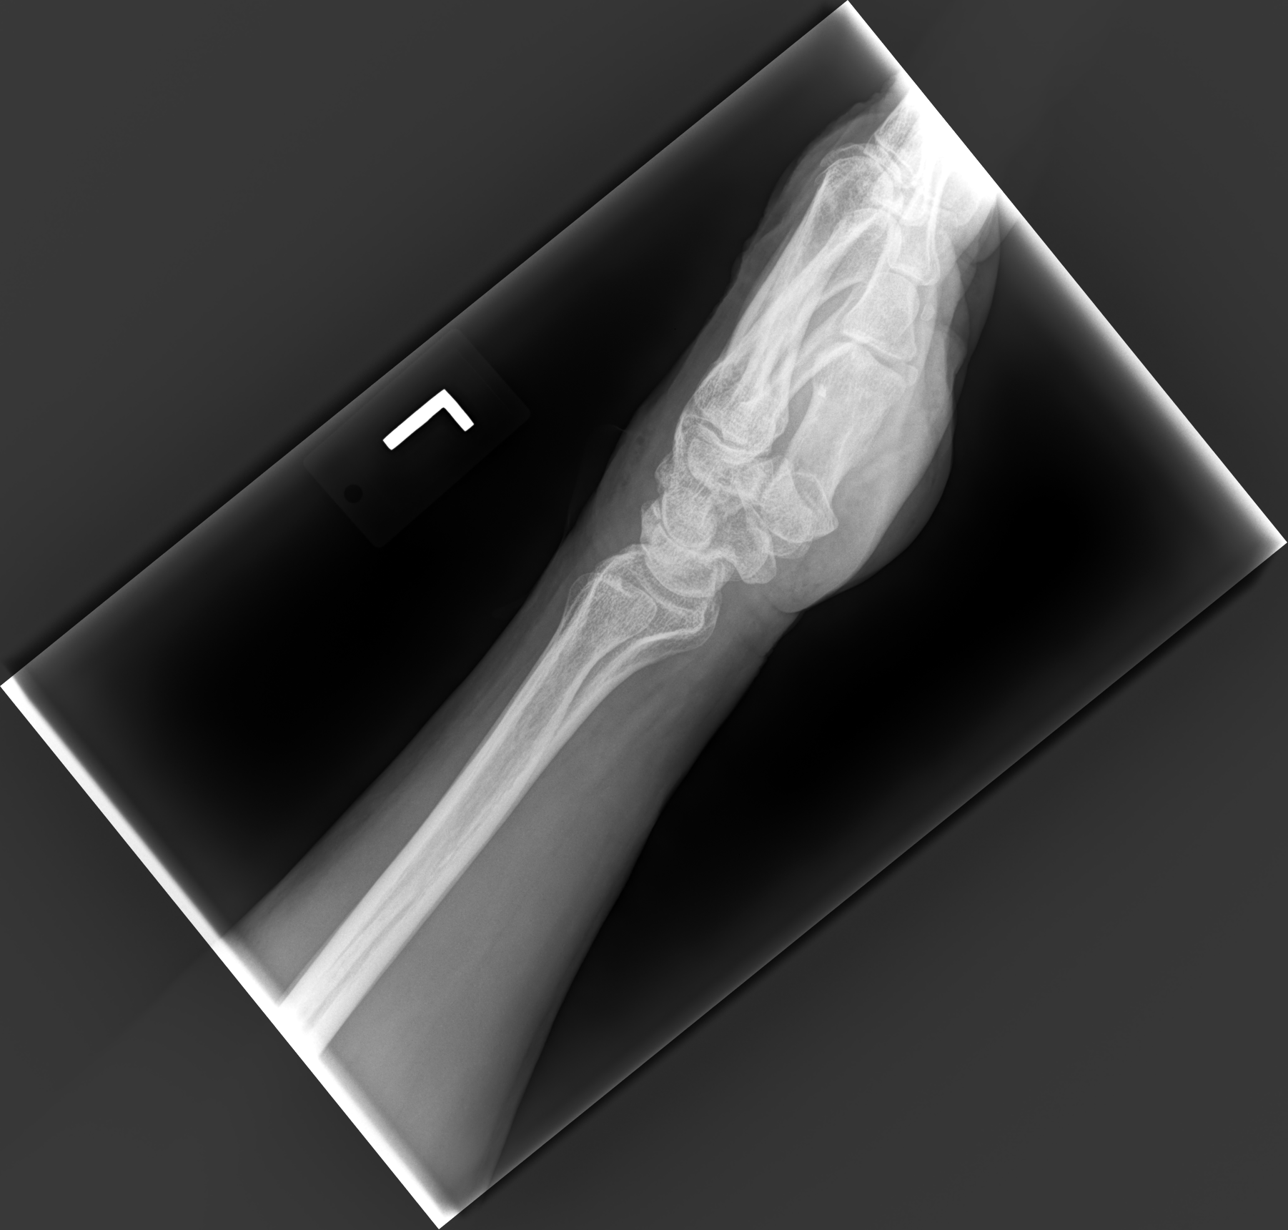

[hand pa]
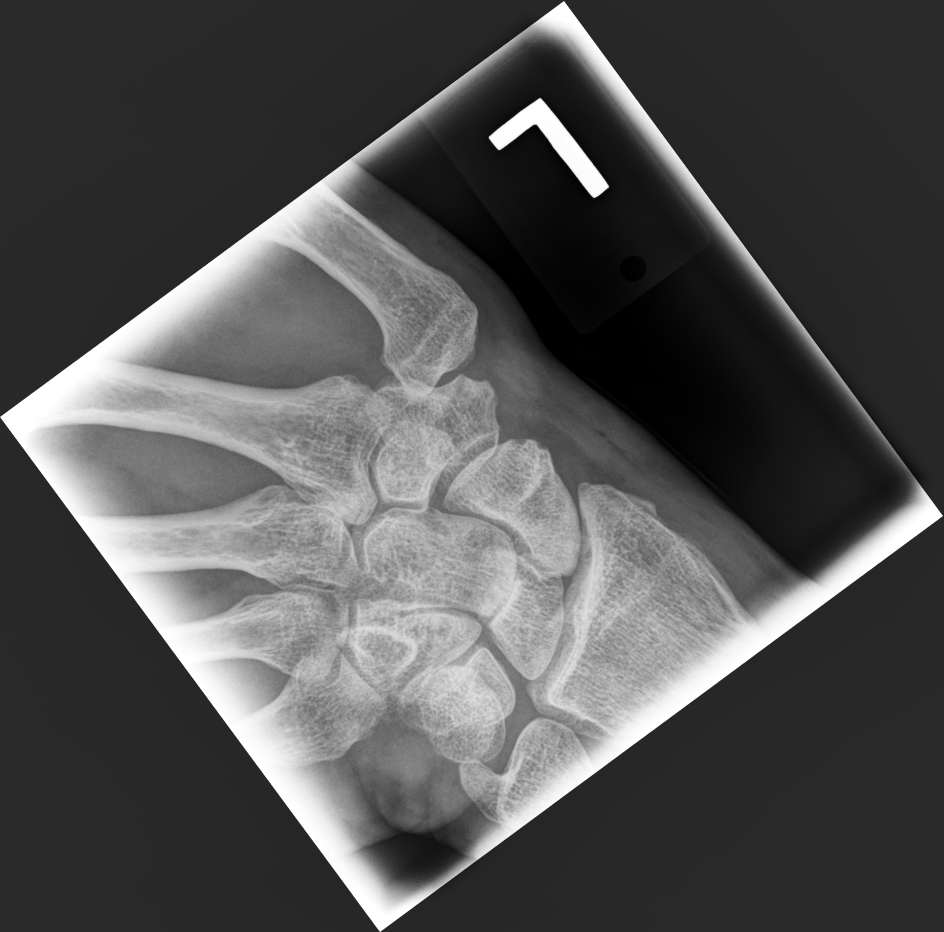

[4 of 4 positions shown; findings below may reference images not displayed]

FINDINGS: Degenerative changes of the radiocarpal joint are seen. No acute
fracture or dislocation is noted. Stable metallic foreign body is
noted adjacent to the distal aspect of the first metacarpal
IMPRESSION: Degenerative change without acute abnormality.

## 2019-06-03 MED ORDER — AMBULATORY NON FORMULARY MEDICATION: 0 refills | Status: DC

## 2019-06-03 NOTE — Assessment & Plan Note (Signed)
Chronic problem : Exacerbating  interventions previously, including medication management: Injection, topical anti-inflammatories which were continued at this point will be chronic.   Interventions this visit: Injection We discussed with patient the importance ergonomics, home exercises, icing regimen, and over-the-counter natural products.   Future considerations but will be based on evaluation and next visit: MRI and surgery.  Due to social determinants of health patient needs to continue to work at the moment and this is his livelihood.  Will not consider surgery at this moment.    Return to clinic: 12 weeks

## 2019-06-03 NOTE — Patient Instructions (Addendum)
Good to see you  xray today We will hold on surgery or MRI  See me again when you need me

## 2019-06-03 NOTE — Assessment & Plan Note (Signed)
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 We discussed with patient the importance ergonomics, home exercises, icing regimen, and over-the-counter natural products.   Future considerations but will be based on evaluation and next visit: Custom bracing I think will be most beneficial formal physical therapy within treatment guidelines    Return to clinic: 6 to 8 weeks

## 2019-06-03 NOTE — Assessment & Plan Note (Signed)
   Decision today to treat with OMT was based on Physical Exam  After verbal consent patient was treated with HVLA, ME, FPR techniques in cervical, thoracic, rib,areas, all areas are chronic   Patient tolerated the procedure well with improvement in symptoms  Patient given exercises, stretches and lifestyle modifications  See medications in patient instructions if given  Patient will follow up in 4-8 weeks 

## 2019-07-17 DIAGNOSIS — J45909 Unspecified asthma, uncomplicated: Secondary | ICD-10-CM | POA: Diagnosis not present

## 2019-07-17 DIAGNOSIS — J309 Allergic rhinitis, unspecified: Secondary | ICD-10-CM | POA: Diagnosis not present

## 2019-07-17 DIAGNOSIS — I10 Essential (primary) hypertension: Secondary | ICD-10-CM | POA: Diagnosis not present

## 2019-07-17 DIAGNOSIS — Z0000 Encounter for general adult medical examination without abnormal findings: Secondary | ICD-10-CM | POA: Diagnosis not present

## 2019-10-14 DIAGNOSIS — J45909 Unspecified asthma, uncomplicated: Secondary | ICD-10-CM | POA: Diagnosis not present

## 2019-10-14 DIAGNOSIS — D229 Melanocytic nevi, unspecified: Secondary | ICD-10-CM | POA: Diagnosis not present

## 2019-10-14 DIAGNOSIS — I10 Essential (primary) hypertension: Secondary | ICD-10-CM | POA: Diagnosis not present

## 2019-10-14 DIAGNOSIS — H6690 Otitis media, unspecified, unspecified ear: Secondary | ICD-10-CM | POA: Diagnosis not present

## 2019-11-14 DIAGNOSIS — D485 Neoplasm of uncertain behavior of skin: Secondary | ICD-10-CM | POA: Diagnosis not present

## 2019-11-14 DIAGNOSIS — L821 Other seborrheic keratosis: Secondary | ICD-10-CM | POA: Diagnosis not present

## 2019-11-14 DIAGNOSIS — L739 Follicular disorder, unspecified: Secondary | ICD-10-CM | POA: Diagnosis not present

## 2019-11-14 DIAGNOSIS — L570 Actinic keratosis: Secondary | ICD-10-CM | POA: Diagnosis not present

## 2019-12-30 ENCOUNTER — Encounter: Payer: Self-pay | Admitting: Family Medicine

## 2019-12-30 ENCOUNTER — Other Ambulatory Visit: Payer: Self-pay

## 2019-12-30 ENCOUNTER — Ambulatory Visit: Payer: Self-pay

## 2019-12-30 ENCOUNTER — Ambulatory Visit: Payer: Medicare Other | Admitting: Family Medicine

## 2019-12-30 VITALS — BP 142/88 | HR 90 | Ht 68.0 in | Wt 186.0 lb

## 2019-12-30 DIAGNOSIS — M25532 Pain in left wrist: Secondary | ICD-10-CM

## 2019-12-30 DIAGNOSIS — S63592D Other specified sprain of left wrist, subsequent encounter: Secondary | ICD-10-CM

## 2019-12-30 DIAGNOSIS — M654 Radial styloid tenosynovitis [de Quervain]: Secondary | ICD-10-CM | POA: Diagnosis not present

## 2019-12-30 DIAGNOSIS — M19032 Primary osteoarthritis, left wrist: Secondary | ICD-10-CM

## 2019-12-30 NOTE — Progress Notes (Signed)
[place] Phone: [phone] Subjective:   Edwin Brown, am serving as a scribe for Dr. Hulan Saas. This visit occurred during the SARS-CoV-2 public health emergency.  Safety protocols were in place, including screening questions prior to the visit, additional usage of staff PPE, and extensive cleaning of exam room while observing appropriate contact time as indicated for disinfecting solutions.   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 We discussed with patient the importance ergonomics, home exercises, icing regimen, and over-the-counter natural products.   Future considerations but will be based on evaluation and next visit: Custom bracing I think will be most beneficial formal physical therapy within treatment guidelines  Update 12/30/2019 Edwin Brown is a 64 y.o. male coming in with complaint of left wrist pain. Patient states that his pain has been increasing. Using a brace for support and pain relief. Pain over scaphoid and CMC joint. Injected TFCC and abductor pollicus longus at last visit. Would like injection again today. Patient is responding well to the injection.  Did have x-rays at last exam.  X-rays do show the diffuse degenerative changes of the wrist especially the radiocarpal joint and does have a foreign body still noted that seems to be metallic from an injury that is stable from 2017.      Past Medical History:  Diagnosis Date  . HTN (hypertension)   . Syncope    Past  Surgical History:  Procedure Laterality Date  . APPENDECTOMY    . CHOLECYSTECTOMY N/A 01/30/2014   Procedure: LAPAROSCOPIC CHOLECYSTECTOMY WITH INTRAOPERATIVE CHOLANGIOGRAM;  Surgeon: Pedro Earls, MD;  Location: [facility];  Service: General;  Laterality: N/A;  . NASAL SINUS SURGERY    . VASECTOMY     Social History   Socioeconomic History  . Marital status: Married    Spouse name: Not on file  . Number of children: 3  . Years of education: Not on file  . Highest education level: Not on file  Occupational History  . Occupation: Insurance underwriter  Tobacco Use  . Smoking status: Never Smoker  . Smokeless tobacco: Never Used  Substance and Sexual Activity  . Alcohol use: Yes    Comment: ONCE A DAY   . Drug use: Brown  . Sexual activity: Not on file  Other Topics Concern  . Not on file  Social History Narrative  . Not on file   Social Determinants of Health   Financial Resource Strain:   . Difficulty of Paying Living Expenses: Not on file  Food Insecurity:   . Worried About Charity fundraiser in the Last Year: Not on file  . Ran Out of Food in the Last Year: Not on file  Transportation Needs:   . Lack of Transportation (Medical): Not on file  . Lack of Transportation (Non-Medical): Not on file  Physical Activity:   . Days of Exercise per Week: Not on file  . Minutes of Exercise per Session: Not on file  Stress:   . Feeling of Stress : Not on file  Social Connections:   .  Frequency of Communication with Friends and Family: Not on file  . Frequency of Social Gatherings with Friends and Family: Not on file  . Attends Religious Services: Not on file  . Active Member of Clubs or Organizations: Not on file  . Attends Archivist Meetings: Not on file  . Marital Status: Not on file   Allergies  Allergen Reactions  . Dilaudid [Hydromorphone Hcl] Shortness Of Breath  . Phenergan [Promethazine Hcl] Other (See Comments)    Shaky, very out of sorts   . Other     HORSE SERUM  TETANUS ANTI TOX : PATIENT GETS HIGH FEVER   Family History  Problem Relation Age of Onset  . Heart attack Father   . Heart attack Brother      Current Outpatient Medications (Cardiovascular):  .  losartan (COZAAR) 100 MG tablet, Take 100 mg by mouth daily.  Current Outpatient Medications (Respiratory):  .  albuterol (PROVENTIL HFA;VENTOLIN HFA) 108 (90 BASE) MCG/ACT inhaler, Inhale 1 puff into the lungs every 6 (six) hours as needed for wheezing or shortness of breath. .  loratadine (CLARITIN) 10 MG tablet, Take 10 mg by mouth daily as needed for allergies.  Current Outpatient Medications (Analgesics):  .  traMADol (ULTRAM) 50 MG tablet, Take 50 mg by mouth every 4 (four) hours as needed for moderate pain.   Current Outpatient Medications (Other):  Marland Kitchen  ALPRAZolam (XANAX) 0.25 MG tablet, Take 0.25 mg by mouth at bedtime as needed for anxiety. .  AMBULATORY NON FORMULARY MEDICATION, Left wrist brace .  clarithromycin (BIAXIN) 500 MG tablet, Take 500 mg by mouth 2 (two) times daily. For 10 days .  Diclofenac Sodium 2 % SOLN, Apply 1 pump twice daily. [name] (GLUCOSAMINE MSM COMPLEX PO), Take 2 capsules by mouth daily.  .  Turmeric Curcumin 500 MG CAPS, Take 1,000 mg by mouth daily.   Reviewed prior external information including notes and imaging from  primary care provider As well as notes that were available from care everywhere and other healthcare systems.  Past medical history, social, surgical and family history all reviewed in electronic medical record.  Brown pertanent information unless stated regarding to the chief complaint.   Review of Systems:  Brown headache, visual changes, nausea, vomiting, diarrhea, constipation, dizziness, abdominal pain, skin rash, fevers, chills, night sweats, weight loss, swollen lymph nodes, body aches, joint swelling, chest pain, shortness of breath, mood changes. POSITIVE muscle aches  Objective  Blood pressure (!)  142/88, pulse 90, height 5\' 8"  (1.727 m), weight 186 lb (84.4 kg), SpO2 99 %.   General: Brown apparent distress alert and oriented x3 mood and affect normal, dressed appropriately.  HEENT: Pupils equal, extraocular movements intact  Respiratory: Patient's speak in full sentences and does not appear short of breath  Cardiovascular: Brown lower extremity edema, non tender, Brown erythema  Gait normal with good balance and coordination.  MSK: Left wrist exam shows pain over the TFCC as well as over the de Quervain's minorly.  Pain over the radiocarpal joint still noted as well but Brown mass truly appreciated.    Procedure: Real-time Ultrasound Guided Injection of abductor pollicis longus. Device: GE Logiq Q7 Ultrasound guided injection is preferred based studies that show increased duration, increased effect, greater accuracy, decreased procedural pain, increased response rate, and decreased cost with ultrasound guided versus blind injection.  Verbal informed consent obtained.  Time-out conducted.  Noted Brown overlying erythema, induration, or other signs of local infection.  Skin prepped  in a sterile fashion.  Local anesthesia: Topical Ethyl chloride.  With sterile technique and under real time ultrasound guidance: With a 25-gauge half inch needle injected with 0.5 cc of 0.5% Marcaine and 0.5 cc of Kenalog 40 mg/mL.  This was mislabeled on the imaging with the TFCC. Completed without difficulty  Pain immediately resolved suggesting accurate placement of the medication.  Advised to call if fevers/chills, erythema, induration, drainage, or persistent bleeding.  Impression: Technically successful ultrasound guided injection.   Impression and Recommendations:     The above documentation has been reviewed and is accurate and complete [name], DO

## 2019-12-30 NOTE — Patient Instructions (Addendum)
Injected wrist today You do have the foreign body in the wrist we will monitor. May need MRI  See me again

## 2019-12-30 NOTE — Assessment & Plan Note (Signed)
Repeat injection given today December 30, 2019.  Has been 7 months since we have done more than previously.  Discussed with patient the patient also has some mild de Quervain's and patient's x-rays do show the foreign body.  We discussed again about the potential for CT or MRI.  Patient wants to not do that at this time.  Patient would not want surgical intervention.  On ultrasound did not see the foreign body that would make me concerned for anything else but does have the underlying radiocarpal arthritic changes.  Patient will continue to be active otherwise.  Follow-up with me again I recommend 3 months but he would like to see me as needed, I did call the patient to discuss again about the foreign body on the x-ray that is stable since 2017 could consider removal which patient declined.  Discussed if any worsening redness or streaking call or seek medical attention immediately.

## 2019-12-30 NOTE — Assessment & Plan Note (Signed)
With a foreign body.  Patient still wants to avoid any surgical intervention so we will avoid the advanced imaging for now.

## 2019-12-30 NOTE — Assessment & Plan Note (Deleted)
Repeat injection given today December 30, 2019.  Has been 7 months since we have done more than previously.  Discussed with patient the patient also has some mild de Quervain's and patient's x-rays do show the foreign body.  We discussed again about the potential for CT or MRI.  Patient wants to not do that at this time.  Patient would not want surgical intervention.  On ultrasound did not see the foreign body that would make me concerned for anything else but does have the underlying radiocarpal arthritic changes.  Patient will continue to be active otherwise.  Follow-up with me again I recommend 3 months but he would like to see me as needed, I did call the patient to discuss again about the foreign body on the x-ray that is stable since 2017 could consider removal which patient declined.  Discussed if any worsening redness or streaking call or seek medical attention immediately.

## 2020-01-20 DIAGNOSIS — J310 Chronic rhinitis: Secondary | ICD-10-CM | POA: Diagnosis not present

## 2020-01-20 DIAGNOSIS — H6983 Other specified disorders of Eustachian tube, bilateral: Secondary | ICD-10-CM | POA: Diagnosis not present

## 2020-01-20 DIAGNOSIS — H9211 Otorrhea, right ear: Secondary | ICD-10-CM | POA: Diagnosis not present

## 2020-03-03 ENCOUNTER — Ambulatory Visit (INDEPENDENT_AMBULATORY_CARE_PROVIDER_SITE_OTHER): Payer: Medicare Other

## 2020-03-03 ENCOUNTER — Encounter: Payer: Self-pay | Admitting: Family Medicine

## 2020-03-03 ENCOUNTER — Ambulatory Visit: Payer: Medicare Other | Admitting: Family Medicine

## 2020-03-03 ENCOUNTER — Ambulatory Visit: Payer: Self-pay

## 2020-03-03 ENCOUNTER — Other Ambulatory Visit: Payer: Self-pay

## 2020-03-03 VITALS — BP 162/98 | HR 96 | Ht 68.0 in | Wt 184.0 lb

## 2020-03-03 DIAGNOSIS — M67432 Ganglion, left wrist: Secondary | ICD-10-CM | POA: Diagnosis not present

## 2020-03-03 DIAGNOSIS — S63592D Other specified sprain of left wrist, subsequent encounter: Secondary | ICD-10-CM | POA: Diagnosis not present

## 2020-03-03 DIAGNOSIS — G890 Central pain syndrome: Secondary | ICD-10-CM | POA: Diagnosis not present

## 2020-03-03 DIAGNOSIS — G8929 Other chronic pain: Secondary | ICD-10-CM

## 2020-03-03 DIAGNOSIS — M25532 Pain in left wrist: Secondary | ICD-10-CM

## 2020-03-03 DIAGNOSIS — M25561 Pain in right knee: Secondary | ICD-10-CM

## 2020-03-03 IMAGING — DX DG KNEE COMPLETE 4+V*R*
4 series · 4 of 4 positions shown · non-contrast
Comparison: None.

CLINICAL DATA: Right knee pain.  No injury.

EXAM:
RIGHT KNEE - COMPLETE 4+ VIEW

[knee ap]
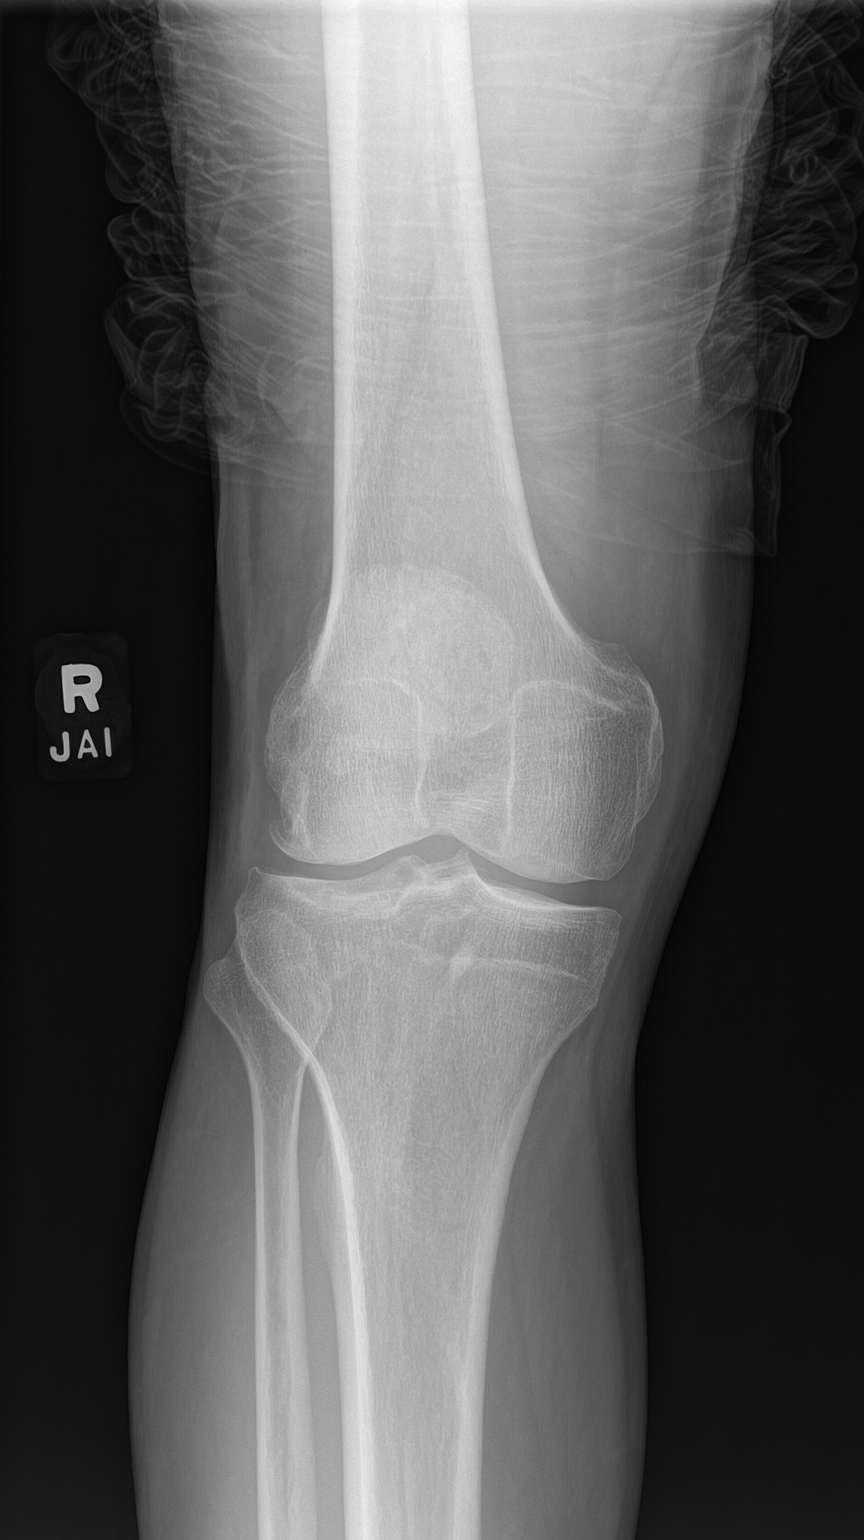

[knee obl (1 of 2)]
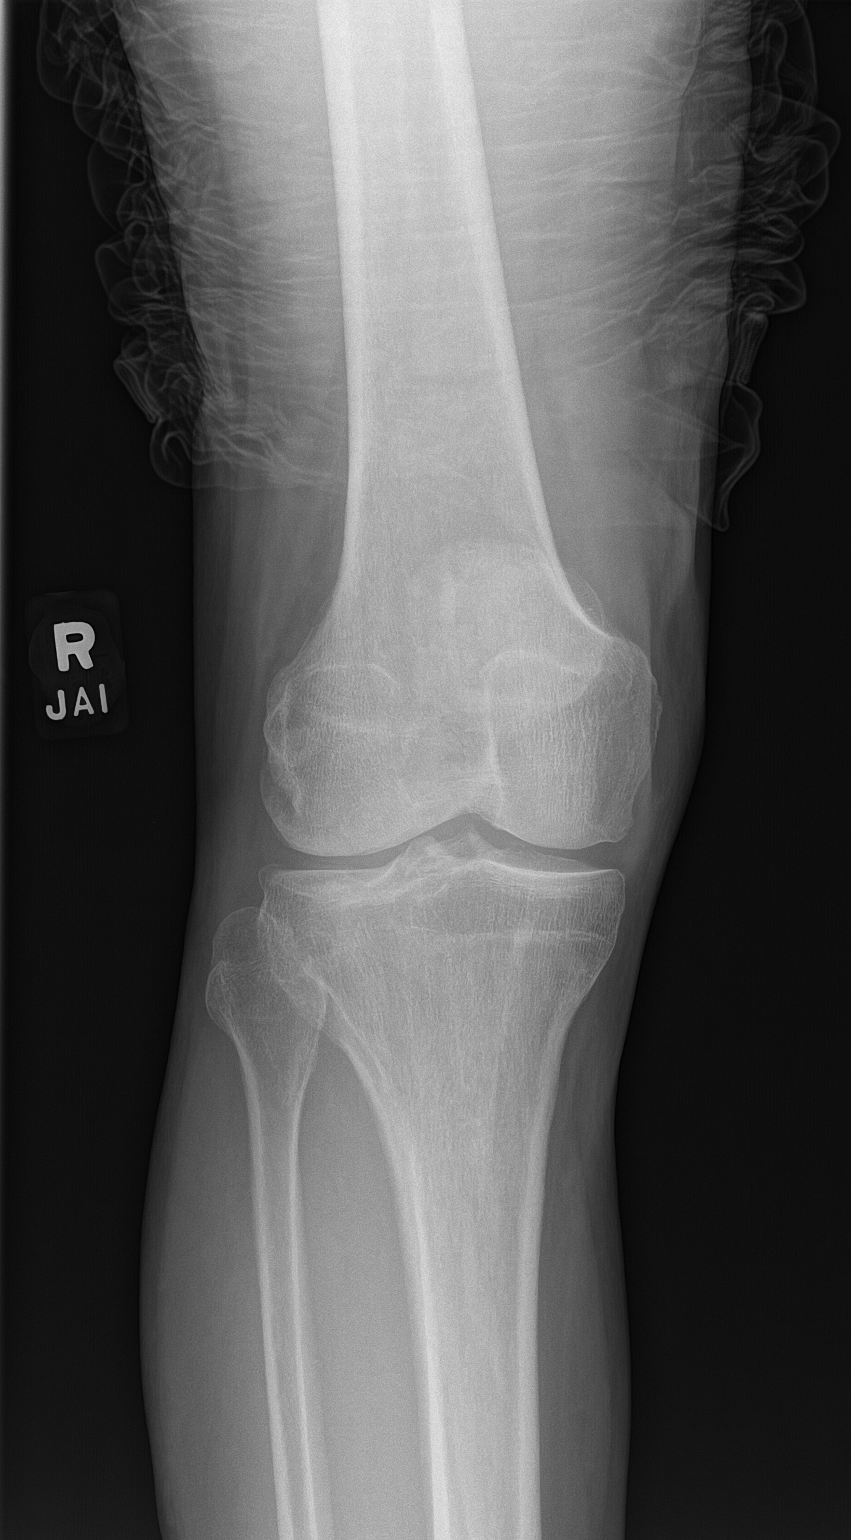

[knee obl (2 of 2)]
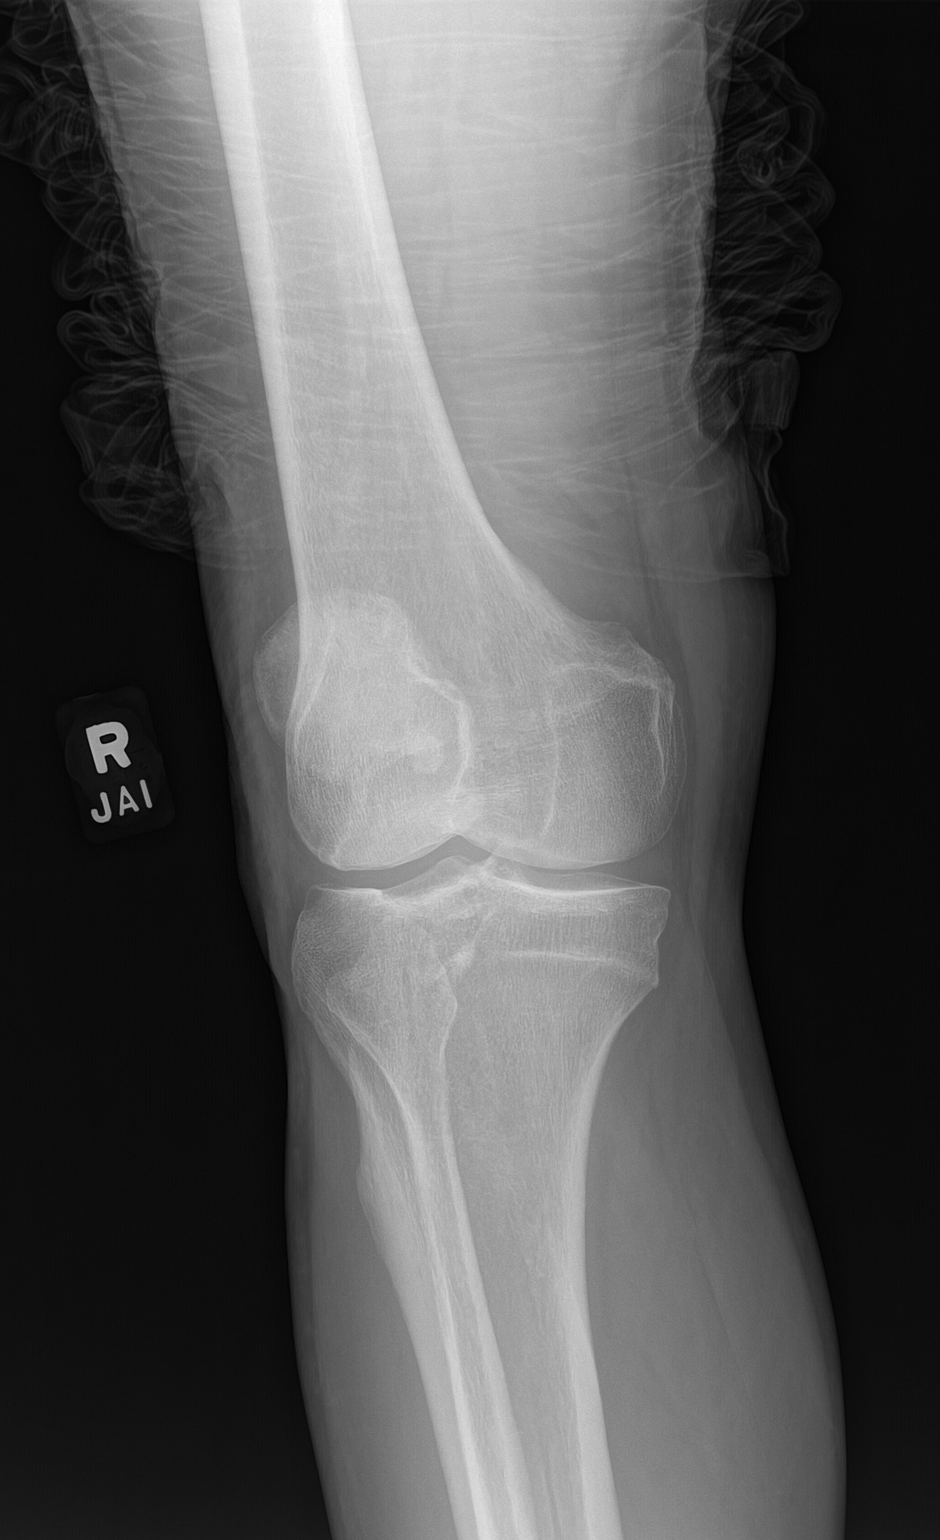

[knee lat]
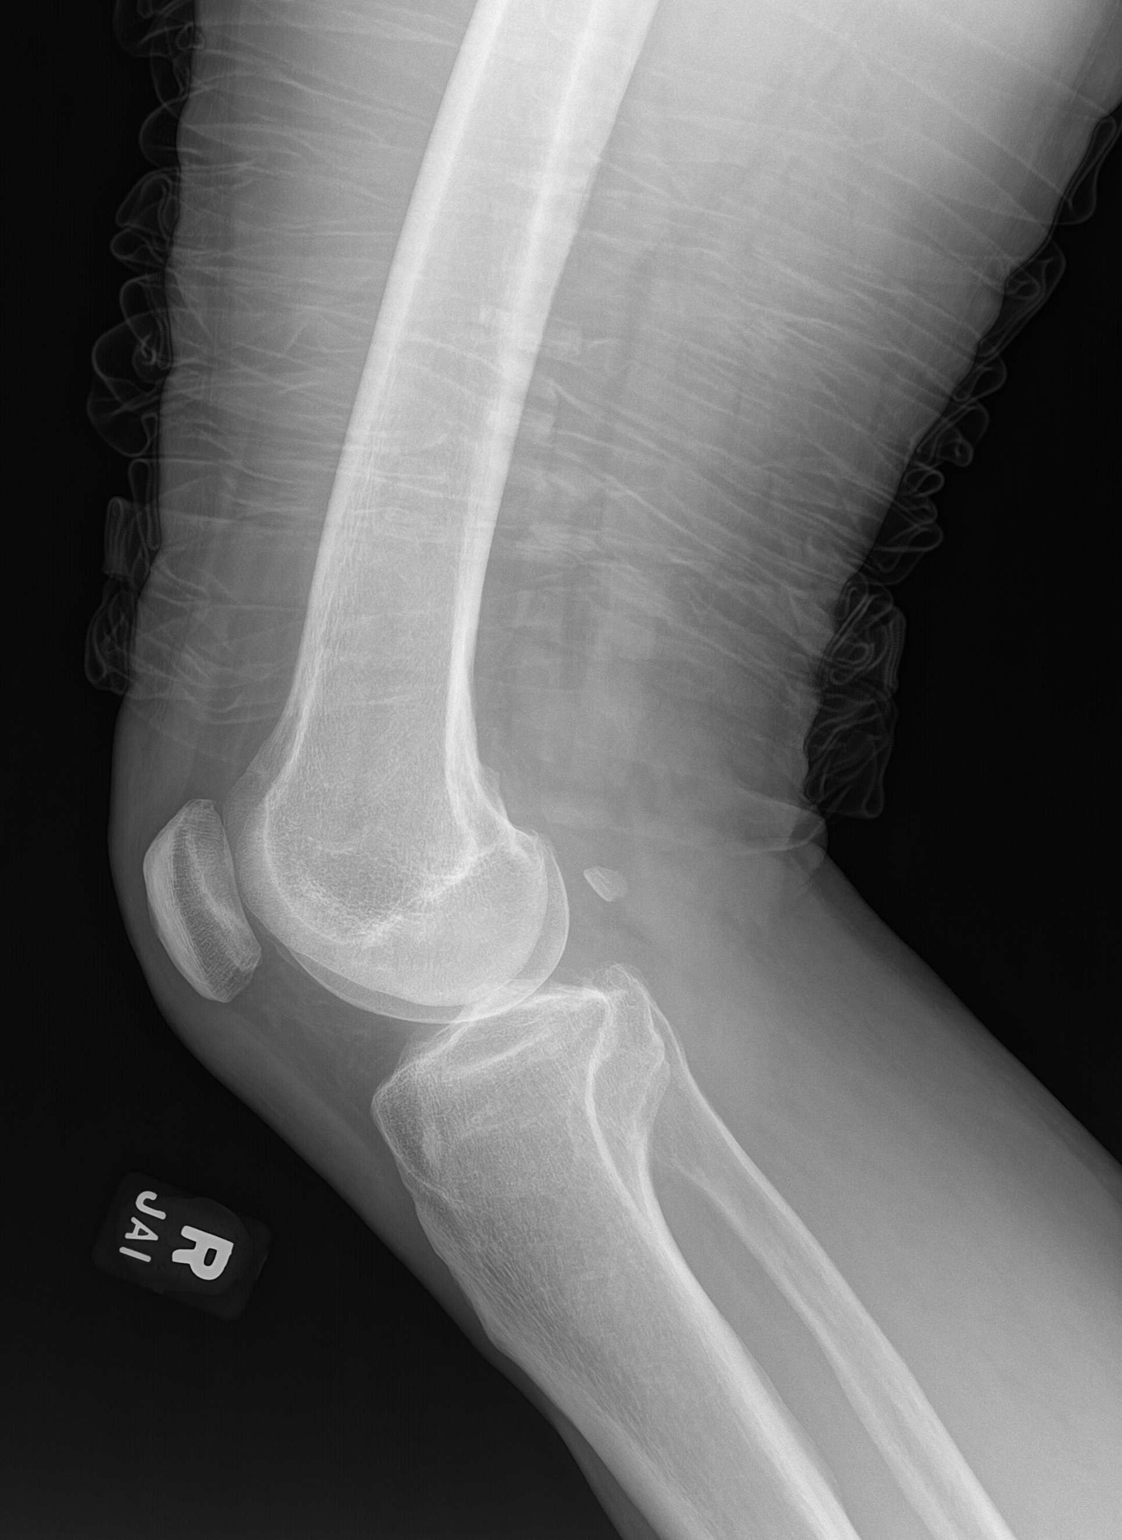

[4 of 4 positions shown; findings below may reference images not displayed]

FINDINGS: No acute fracture or dislocation. No joint effusion. Mild lateral
compartment joint space narrowing with marginal osteophytes. Bone
mineralization is normal. Soft tissues are unremarkable.
IMPRESSION: 1. Mild lateral compartment osteoarthritis.

## 2020-03-03 NOTE — Assessment & Plan Note (Signed)
Patient does have more of her right knee pain.  Seems to be fairly nonspecific.  Seems like there is a possibility of more of a mild tendinitis of the patellar tendon which is noted on ultrasound with some mild increase in Doppler flow.  Patient also though also has some findings that do show some potential consistency with the patellofemoral syndrome.  Tru pull lite brace given, home exercises given, topical anti-inflammatories discussed.  Follow-up again in 4 to 6 weeks

## 2020-03-03 NOTE — Patient Instructions (Signed)
Great to see you  Knee xray on the way out  We will get you in with Dr. Amedeo Plenty to discuss the new cyst- just dont like where it is  For the knee wear brace daily with activity for a 2-3 weeks pennsaid pinkie amount topically 2 times daily as needed.  Ice 20 minutes 2 times daily. Usually after activity and before bed. Exercises 3 times a week.  See me again in 5-6 weeks

## 2020-03-03 NOTE — Progress Notes (Signed)
[facility] [address] [place] [address] Phone: [phone] Subjective:   Edwin Brown, am serving as a scribe for Dr. Antoine Primas. This visit occurred during the SARS-CoV-2 public health emergency.  Safety protocols were in place, including screening questions prior to the visit, additional usage of staff PPE, and extensive cleaning of exam room while observing appropriate contact time as indicated for disinfecting solutions.  I'm seeing this patient by the request  of:  Maurice Small, MD  CC: knee pain , wrist pain follow-up  [id number]   12/30/2019 Repeat injection given today December 30, 2019.  Has been 7 months since we have done more than previously.  Discussed with patient the patient also has some mild de Quervain's and patient's x-rays do show the foreign body.  We discussed again about the potential for CT or MRI.  Patient wants to not do that at this time.  Patient would not want surgical intervention.  On ultrasound did not see the foreign body that would make me concerned for anything else but does have the underlying radiocarpal arthritic changes.  Patient will continue to be active otherwise.  Follow-up with me again I recommend 3 months but he would like to see me as needed, I did call the patient to discuss again about the foreign body on the x-ray that is stable since 2017 could consider removal which patient declined.  Discussed if any worsening redness or streaking call or seek medical attention immediately.  Update 03/03/2020 Edwin Brown is a 64 y.o. male coming in with complaint of left wrist and right knee pain. Knee pain behind patella that began 8 days ago. Taking IBU and Tylenol.  Patient states initially had significant amount of pain and still has pain if he puts pressure on the knee.  Patient denies any injury that he can think of.  Was kneeling on his knee for an extended amount of time.  Possibly was swelling  initially and seems to be improving at this time  Patient states that a lump has appeared over [facility] joint of left wrist.  Patient states that this is different.  Feels that the TFCC where we have done the injection previously is significantly improved this is different.  He notices more swelling on the palmar aspect of the wrist near the thumb he states.   left knee injected 01/2019 Left knww xray 01/2019- normal   Past Medical History:  Diagnosis Date  . HTN (hypertension)   . Syncope    Past Surgical History:  Procedure Laterality Date  . APPENDECTOMY    . CHOLECYSTECTOMY N/A 01/30/2014   Procedure: LAPAROSCOPIC CHOLECYSTECTOMY WITH INTRAOPERATIVE CHOLANGIOGRAM;  Surgeon: Valarie Merino, MD;  Location: [facility];  Service: General;  Laterality: N/A;  . NASAL SINUS SURGERY    . VASECTOMY     Social History   Socioeconomic History  . Marital status: Married    Spouse name: Not on file  . Number of children: 3  . Years of education: Not on file  . Highest education level: Not on file  Occupational History  . Occupation: Market researcher  Tobacco Use  . Smoking status: Never Smoker  . Smokeless tobacco: Never Used  Substance and Sexual Activity  . Alcohol use: Yes    Comment: ONCE A DAY   . Drug use: No  . Sexual activity: Not on file  Other Topics Concern  . Not on file  Social History Narrative  . Not on  file   Social Determinants of Health   Financial Resource Strain: Not on file  Food Insecurity: Not on file  Transportation Needs: Not on file  Physical Activity: Not on file  Stress: Not on file  Social Connections: Not on file   Allergies  Allergen Reactions  . Dilaudid [Hydromorphone Hcl] Shortness Of Breath  . Phenergan [Promethazine Hcl] Other (See Comments)    Shaky, very out of sorts   . Other     HORSE SERUM TETANUS ANTI TOX : PATIENT GETS HIGH FEVER   Family History  Problem Relation Age of Onset  . Heart attack Father   . Heart attack Brother       Current Outpatient Medications (Cardiovascular):  .  losartan (COZAAR) 100 MG tablet, Take 100 mg by mouth daily.  Current Outpatient Medications (Respiratory):  .  albuterol (PROVENTIL HFA;VENTOLIN HFA) 108 (90 BASE) MCG/ACT inhaler, Inhale 1 puff into the lungs every 6 (six) hours as needed for wheezing or shortness of breath. .  loratadine (CLARITIN) 10 MG tablet, Take 10 mg by mouth daily as needed for allergies.  Current Outpatient Medications (Analgesics):  .  traMADol (ULTRAM) 50 MG tablet, Take 50 mg by mouth every 4 (four) hours as needed for moderate pain.   Current Outpatient Medications (Other):  Marland Kitchen  ALPRAZolam (XANAX) 0.25 MG tablet, Take 0.25 mg by mouth at bedtime as needed for anxiety. .  AMBULATORY NON FORMULARY MEDICATION, Left wrist brace .  clarithromycin (BIAXIN) 500 MG tablet, Take 500 mg by mouth 2 (two) times daily. For 10 days .  Diclofenac Sodium 2 % SOLN, Apply 1 pump twice daily. [name] (GLUCOSAMINE MSM COMPLEX PO), Take 2 capsules by mouth daily.  .  Turmeric Curcumin 500 MG CAPS, Take 1,000 mg by mouth daily.   Reviewed prior external information including notes and imaging from  primary care provider As well as notes that were available from care everywhere and other healthcare systems.  Past medical history, social, surgical and family history all reviewed in electronic medical record.  No pertanent information unless stated regarding to the chief complaint.   Review of Systems:  No headache, visual changes, nausea, vomiting, diarrhea, constipation, dizziness, abdominal pain, skin rash, fevers, chills, night sweats, weight loss, swollen lymph nodes, body aches, joint swelling, chest pain, shortness of breath, mood changes. POSITIVE muscle aches  Objective  Blood pressure (!) 162/98, pulse 96, height 5\' 8"  (1.727 m), weight 184 lb (83.5 kg), SpO2 96 %.   General: No apparent distress alert and oriented x3 mood and affect  normal, dressed appropriately.  HEENT: Pupils equal, extraocular movements intact  Respiratory: Patient's speak in full sentences and does not appear short of breath  Cardiovascular: No lower extremity edema, non tender, no erythema  MSK:  N significant arthritic changes of multiple joints Right knee exam shows the patient is tender over the proximal aspect of the patella tendon at the inferior pole of the patella.  Patient does also have some mild lateral tracking of the patella noted with some mild pain with apprehension test.  No significant swelling noted.  Full range of motion of the knee noted.  No significant instability  Left wrist exam shows on the palmar aspect the patient does have more of a cyst formation near the radial artery.  This does seem to be distinct difference from the radial artery though.  Consistent with more of a cyst formation.  Patient still has arthritic changes of the wrist noted.  Good grip strength less swelling over the TFCC from previous exam  Limited musculoskeletal ultrasound was performed and interpreted by [name]  Limited ultrasound of patient's left wrist on the palmar aspect shows that patient does have what appears to be more of a ganglion cyst now noted in this area has hypoechoic changes.  This is unfortunately significant in close proximity to the radial artery and is actually deep to the radial artery. Decreased swelling noted of the TFCC from previous exam  Patient's right knee shows the patient does have some mild increase in Doppler flow at the proximal aspect of the origin of the patella tendon at the inferior patella.  Patient also has some mild arthritic changes of the patellofemoral joint with a trace effusion that seems to be resolving in this area.  Medial meniscus does have some degenerative changes but nondisplaced.  Impression: Ganglion cyst of the left wrist with close proximity to the radial artery Right knee possible mild patellar  tendinitis with more likely mild patellofemoral arthritis   Impression and Recommendations:     The above documentation has been reviewed and is accurate and complete [name], DO

## 2020-03-03 NOTE — Assessment & Plan Note (Signed)
Does have a tear but has been able to respond well to the injections previously.  Patient feels like the last injection has helped out significantly and now more of the cyst giving him more trouble.

## 2020-03-03 NOTE — Assessment & Plan Note (Signed)
This seems to be neurologist increase in size that is significant.  Unable to aspirate secondary to the location that is actually inferior to the radial artery.  I do believe with patient working with his hands that this may need potential surgical intervention or someone else could potentially consider aspiration in a different light.  Patient will be sent to hand surgery for further evaluation.  Follow-up with me again in 5 to 6 weeks

## 2020-03-17 DIAGNOSIS — M19032 Primary osteoarthritis, left wrist: Secondary | ICD-10-CM | POA: Diagnosis not present

## 2020-03-17 DIAGNOSIS — M25532 Pain in left wrist: Secondary | ICD-10-CM | POA: Diagnosis not present

## 2020-03-19 ENCOUNTER — Ambulatory Visit: Payer: Medicare Other | Admitting: Family Medicine

## 2020-04-07 ENCOUNTER — Ambulatory Visit: Payer: Medicare Other | Admitting: Family Medicine

## 2020-04-26 DIAGNOSIS — M13832 Other specified arthritis, left wrist: Secondary | ICD-10-CM | POA: Diagnosis not present

## 2020-05-21 DIAGNOSIS — M659 Synovitis and tenosynovitis, unspecified: Secondary | ICD-10-CM | POA: Diagnosis not present

## 2020-05-21 DIAGNOSIS — M24132 Other articular cartilage disorders, left wrist: Secondary | ICD-10-CM | POA: Diagnosis not present

## 2020-05-21 DIAGNOSIS — M1812 Unilateral primary osteoarthritis of first carpometacarpal joint, left hand: Secondary | ICD-10-CM | POA: Diagnosis not present

## 2020-05-21 DIAGNOSIS — G8918 Other acute postprocedural pain: Secondary | ICD-10-CM | POA: Diagnosis not present

## 2020-06-03 DIAGNOSIS — Z4789 Encounter for other orthopedic aftercare: Secondary | ICD-10-CM | POA: Diagnosis not present

## 2020-06-03 DIAGNOSIS — M25532 Pain in left wrist: Secondary | ICD-10-CM | POA: Diagnosis not present

## 2020-06-03 DIAGNOSIS — M19032 Primary osteoarthritis, left wrist: Secondary | ICD-10-CM | POA: Diagnosis not present

## 2020-06-17 DIAGNOSIS — M25532 Pain in left wrist: Secondary | ICD-10-CM | POA: Diagnosis not present

## 2020-06-17 DIAGNOSIS — Z4789 Encounter for other orthopedic aftercare: Secondary | ICD-10-CM | POA: Diagnosis not present

## 2020-06-17 DIAGNOSIS — M25632 Stiffness of left wrist, not elsewhere classified: Secondary | ICD-10-CM | POA: Diagnosis not present

## 2020-06-17 DIAGNOSIS — M19032 Primary osteoarthritis, left wrist: Secondary | ICD-10-CM | POA: Diagnosis not present

## 2020-06-22 DIAGNOSIS — M5136 Other intervertebral disc degeneration, lumbar region: Secondary | ICD-10-CM | POA: Diagnosis not present

## 2020-06-22 DIAGNOSIS — M9903 Segmental and somatic dysfunction of lumbar region: Secondary | ICD-10-CM | POA: Diagnosis not present

## 2020-06-22 DIAGNOSIS — M5134 Other intervertebral disc degeneration, thoracic region: Secondary | ICD-10-CM | POA: Diagnosis not present

## 2020-06-22 DIAGNOSIS — M9902 Segmental and somatic dysfunction of thoracic region: Secondary | ICD-10-CM | POA: Diagnosis not present

## 2020-06-28 DIAGNOSIS — M25632 Stiffness of left wrist, not elsewhere classified: Secondary | ICD-10-CM | POA: Diagnosis not present

## 2020-07-06 DIAGNOSIS — M25632 Stiffness of left wrist, not elsewhere classified: Secondary | ICD-10-CM | POA: Diagnosis not present

## 2020-07-14 DIAGNOSIS — M25632 Stiffness of left wrist, not elsewhere classified: Secondary | ICD-10-CM | POA: Diagnosis not present

## 2020-07-15 DIAGNOSIS — M19032 Primary osteoarthritis, left wrist: Secondary | ICD-10-CM | POA: Diagnosis not present

## 2020-07-15 DIAGNOSIS — Z4789 Encounter for other orthopedic aftercare: Secondary | ICD-10-CM | POA: Diagnosis not present

## 2020-07-21 DIAGNOSIS — M25632 Stiffness of left wrist, not elsewhere classified: Secondary | ICD-10-CM | POA: Diagnosis not present

## 2020-07-23 DIAGNOSIS — Z1159 Encounter for screening for other viral diseases: Secondary | ICD-10-CM | POA: Diagnosis not present

## 2020-07-23 DIAGNOSIS — K219 Gastro-esophageal reflux disease without esophagitis: Secondary | ICD-10-CM | POA: Diagnosis not present

## 2020-07-23 DIAGNOSIS — I10 Essential (primary) hypertension: Secondary | ICD-10-CM | POA: Diagnosis not present

## 2020-07-23 DIAGNOSIS — J309 Allergic rhinitis, unspecified: Secondary | ICD-10-CM | POA: Diagnosis not present

## 2020-07-23 DIAGNOSIS — Z0000 Encounter for general adult medical examination without abnormal findings: Secondary | ICD-10-CM | POA: Diagnosis not present

## 2020-08-30 DIAGNOSIS — M25532 Pain in left wrist: Secondary | ICD-10-CM | POA: Diagnosis not present

## 2020-08-30 DIAGNOSIS — Z4789 Encounter for other orthopedic aftercare: Secondary | ICD-10-CM | POA: Diagnosis not present

## 2021-06-21 DIAGNOSIS — J45909 Unspecified asthma, uncomplicated: Secondary | ICD-10-CM | POA: Diagnosis not present

## 2021-06-21 DIAGNOSIS — J019 Acute sinusitis, unspecified: Secondary | ICD-10-CM | POA: Diagnosis not present

## 2021-06-27 NOTE — Progress Notes (Signed)
?Charlann Boxer D.O. ?[facility] ?[facility] ?Phone: [phone] ?Subjective:   ?I, Edwin Brown, am serving as a Education administrator for Dr. Hulan Saas. ?This visit occurred during the SARS-CoV-2 public health emergency.  Safety protocols were in place, including screening questions prior to the visit, additional usage of staff PPE, and extensive cleaning of exam room while observing appropriate contact time as indicated for disinfecting solutions.  ? ?I'm seeing this patient by the request  of:  [name], MD ? ?CC: Bilateral shoulder pain ? ?[id number]  ?03/03/2021 ?Patient does have more of her right knee pain.  Seems to be fairly nonspecific.  Seems like there is a possibility of more of a mild tendinitis of the patellar tendon which is noted on ultrasound with some mild increase in Doppler flow.  Patient also though also has some findings that do show some potential consistency with the patellofemoral syndrome.  Tru pull lite brace given, home exercises given, topical anti-inflammatories discussed.  Follow-up again in 4 to 6 weeks ? ?Does have a tear but has been able to respond well to the injections previously.  Patient feels like the last injection has helped out significantly and now more of the cyst giving him more trouble. ? ?This seems to be neurologist increase in size that is significant.  Unable to aspirate secondary to the location that is actually inferior to the radial artery.  I do believe with patient working with his hands that this may need potential surgical intervention or someone else could potentially consider aspiration in a different light.  Patient will be sent to hand surgery for further evaluation.  Follow-up with me again in 5 to 6 weeks ? ?Updated 06/28/2021 ?Edwin Brown is a 66 y.o. male coming in with complaint of bilateral shoulder pain. Shoulder pain has gotten progressively worse over the last 4-6 weeks. Sharp pain with movement. Location  over the [facility] joint on both shoulders. Ibuprofen helps take the edge off. Does also feels clicking in both shoulders sometimes. No other complaints. ? ? ? ?  ? ?Past Medical History:  ?Diagnosis Date  ? HTN (hypertension)   ? Syncope   ? ?Past Surgical History:  ?Procedure Laterality Date  ? APPENDECTOMY    ? CHOLECYSTECTOMY N/A 01/30/2014  ? Procedure: LAPAROSCOPIC CHOLECYSTECTOMY WITH INTRAOPERATIVE CHOLANGIOGRAM;  Surgeon: Pedro Earls, MD;  Location: [facility];  Service: General;  Laterality: N/A;  ? NASAL SINUS SURGERY    ? VASECTOMY    ? ?Social History  ? ?Socioeconomic History  ? Marital status: Married  ?  Spouse name: Not on file  ? Number of children: 3  ? Years of education: Not on file  ? Highest education level: Not on file  ?Occupational History  ? Occupation: farrier  ?Tobacco Use  ? Smoking status: Never  ? Smokeless tobacco: Never  ?Substance and Sexual Activity  ? Alcohol use: Yes  ?  Comment: ONCE A DAY   ? Drug use: No  ? Sexual activity: Not on file  ?Other Topics Concern  ? Not on file  ?Social History Narrative  ? Not on file  ? ?Social Determinants of Health  ? ?Financial Resource Strain: Not on file  ?Food Insecurity: Not on file  ?Transportation Needs: Not on file  ?Physical Activity: Not on file  ?Stress: Not on file  ?Social Connections: Not on file  ? ?Allergies  ?Allergen Reactions  ? Dilaudid [Hydromorphone Hcl] Shortness Of Breath  ? Phenergan [  Promethazine Hcl] Other (See Comments)  ?  Shaky, very out of sorts   ? Other   ?  HORSE SERUM TETANUS ANTI TOX : PATIENT GETS HIGH FEVER  ? ?Family History  ?Problem Relation Age of Onset  ? Heart attack Father   ? Heart attack Brother   ? ? ? ?Current Outpatient Medications (Cardiovascular):  ?  losartan (COZAAR) 100 MG tablet, Take 100 mg by mouth daily. ? ?Current Outpatient Medications (Respiratory):  ?  albuterol (PROVENTIL HFA;VENTOLIN HFA) 108 (90 BASE) MCG/ACT inhaler, Inhale 1 puff into the lungs every 6 (six) hours as needed for  wheezing or shortness of breath. ?  loratadine (CLARITIN) 10 MG tablet, Take 10 mg by mouth daily as needed for allergies. ? ?Current Outpatient Medications (Analgesics):  ?  traMADol (ULTRAM) 50 MG tablet, Take 50 mg by mouth every 4 (four) hours as needed for moderate pain. ? ? ?Current Outpatient Medications (Other):  ?  ALPRAZolam (XANAX) 0.25 MG tablet, Take 0.25 mg by mouth at bedtime as needed for anxiety. ?  AMBULATORY NON FORMULARY MEDICATION, Left wrist brace ?  clarithromycin (BIAXIN) 500 MG tablet, Take 500 mg by mouth 2 (two) times daily. For 10 days ?  Diclofenac Sodium 2 % SOLN, Apply 1 pump twice daily. ?  Glucos-MSM-C-Mn-Ginger-Willow (GLUCOSAMINE MSM COMPLEX PO), Take 2 capsules by mouth daily.  ?  Turmeric Curcumin 500 MG CAPS, Take 1,000 mg by mouth daily. ? ? ?Reviewed prior external information including notes and imaging from  ?primary care provider ?As well as notes that were available from care everywhere and other healthcare systems. ? ?Past medical history, social, surgical and family history all reviewed in electronic medical record.  No pertanent information unless stated regarding to the chief complaint.  ? ?Review of Systems: ? No headache, visual changes, nausea, vomiting, diarrhea, constipation, dizziness, abdominal pain, skin rash, fevers, chills, night sweats, weight loss, swollen lymph nodes, body aches, joint swelling, chest pain, shortness of breath, mood changes. POSITIVE muscle aches ? ?Objective  ?Blood pressure (!) 168/100, pulse 94, height '5\' 8"'$  (1.727 m), weight 182 lb (82.6 kg), SpO2 97 %. ?  ?General: No apparent distress alert and oriented x3 mood and affect normal, dressed appropriately.  ?HEENT: Pupils equal, extraocular movements intact  ?Respiratory: Patient's speak in full sentences and does not appear short of breath  ?Cardiovascular: No lower extremity edema, non tender, no erythema  ?Gait normal with good balance and coordination.  ?MSK: Neck exam does have some  loss of lordosis.  Patient does have tightness with sidebending bilaterally.  Patient does have difficulty with the right shoulder.  Positive crossover test noted.  Rotator cuff strength does appear to be intact.  Left shoulder exam does have positive impingement with Neer and Hawkins.  Rotator cuff strength is intact.  Full range of motion but does have difficulty with full internal range of motion. ? ?Shoulder: left ?Inspection reveals no abnormalities, atrophy or asymmetry. ?Palpation is normal with no tenderness over AC joint or bicipital groove. ?ROM is full in all planes passively. ?Rotator cuff strength normal throughout. ?signs of impingement with positive Neer and Hawkin's tests, but negative empty can sign. ?Speeds and Yergason's tests normal. ?No labral pathology noted with negative Obrien's, negative clunk and good stability. ?Normal scapular function observed. ?No painful arc and no drop arm sign. ?No apprehension sign ? ?MSK US performed of: left ?This study was ordered, performed, and interpreted by Charlann Boxer D.O. ? ?Shoulder: Right and left ?Supraspinatus:  Appears normal on long and transverse views, Bursal bulge seen with shoulder abduction on impingement view.  This is bilaterally ?AC joint:  Capsule distended on the right side with moderate arthritic changes ?Glenohumeral Joint:  Appears normal without effusion. ?Glenoid Labrum:  Intact without visualized tears. ?Biceps Tendon:  Appears normal on long and transverse views, no fraying of tendon, tendon located in intertubercular groove, no subluxation with shoulder internal or external rotation. ? ?Impression: Subacromial bursitis on the left side with shoulder bursitis on the right with acromioclavicular effusion. ? ?Procedure: Real-time Ultrasound Guided Injection of left subacromial space ?Device: GE Logiq E  ?Ultrasound guided injection is preferred based studies that show increased duration, increased effect, greater accuracy, decreased  procedural pain, increased response rate with ultrasound guided versus blind injection.  ?Verbal informed consent obtained.  ?Time-out conducted.  ?Noted no overlying erythema, induration, or other signs of

## 2021-06-28 ENCOUNTER — Ambulatory Visit: Payer: Self-pay

## 2021-06-28 ENCOUNTER — Ambulatory Visit: Payer: Medicare Other | Admitting: Family Medicine

## 2021-06-28 VITALS — BP 168/100 | HR 94 | Ht 68.0 in | Wt 182.0 lb

## 2021-06-28 DIAGNOSIS — M19011 Primary osteoarthritis, right shoulder: Secondary | ICD-10-CM

## 2021-06-28 DIAGNOSIS — M7552 Bursitis of left shoulder: Secondary | ICD-10-CM | POA: Diagnosis not present

## 2021-06-28 DIAGNOSIS — M7551 Bursitis of right shoulder: Secondary | ICD-10-CM

## 2021-06-28 DIAGNOSIS — M19019 Primary osteoarthritis, unspecified shoulder: Secondary | ICD-10-CM | POA: Insufficient documentation

## 2021-06-28 NOTE — Assessment & Plan Note (Signed)
Injection given and patient tolerated the procedure relatively well.  We did do it in the supine position.  Patient has had difficulty with passing out previously but did not.  Patient encouraged not to do as much overhead activity.  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  Follow-up again in 6 to8 weeks  ?

## 2021-06-28 NOTE — Patient Instructions (Signed)
Injection today ?Do prescribed exercises at least 3x a week ?Ice a little ?Don't let horses manhandle you ?See you again in 8 weeks ?

## 2021-06-28 NOTE — Assessment & Plan Note (Signed)
Injection given, tolerated procedure well.  Chronic problem with exacerbation.  Patient also had acromioclavicular arthritis and given injection and there is well.  Discussed the potential for x-rays.  Increase her recently.  Home exercises given and follow-up again in 6 to 8 weeks. ?

## 2021-06-29 ENCOUNTER — Encounter: Payer: Self-pay | Admitting: Family Medicine

## 2021-07-26 DIAGNOSIS — M5134 Other intervertebral disc degeneration, thoracic region: Secondary | ICD-10-CM | POA: Diagnosis not present

## 2021-07-26 DIAGNOSIS — M5136 Other intervertebral disc degeneration, lumbar region: Secondary | ICD-10-CM | POA: Diagnosis not present

## 2021-07-26 DIAGNOSIS — M9902 Segmental and somatic dysfunction of thoracic region: Secondary | ICD-10-CM | POA: Diagnosis not present

## 2021-07-26 DIAGNOSIS — M9903 Segmental and somatic dysfunction of lumbar region: Secondary | ICD-10-CM | POA: Diagnosis not present

## 2021-07-27 DIAGNOSIS — M5136 Other intervertebral disc degeneration, lumbar region: Secondary | ICD-10-CM | POA: Diagnosis not present

## 2021-07-27 DIAGNOSIS — M9903 Segmental and somatic dysfunction of lumbar region: Secondary | ICD-10-CM | POA: Diagnosis not present

## 2021-07-27 DIAGNOSIS — M5134 Other intervertebral disc degeneration, thoracic region: Secondary | ICD-10-CM | POA: Diagnosis not present

## 2021-07-27 DIAGNOSIS — M9902 Segmental and somatic dysfunction of thoracic region: Secondary | ICD-10-CM | POA: Diagnosis not present

## 2021-07-28 NOTE — Progress Notes (Signed)
Edwin Brown [address] [place] Phone: [phone] Subjective:   Edwin Brown, am serving as a scribe for Dr. Hulan Saas. This visit occurred during the SARS-CoV-2 public health emergency.  Safety protocols were in place, including screening questions prior to the visit, additional usage of staff PPE, and extensive cleaning of exam room while observing appropriate contact time as indicated for disinfecting solutions.   I'm seeing this patient by the request  of:  [name], MD  CC: back and neck pain   [id number]  Edwin Brown is a 66 y.o. male coming in with complaint of back and neck pain. Last visit 06/28/2021 for shoulder pain. Patient states Right shoulder is doing well, left still irritating. Has been working and got a sharp pain under shoulder blade on the left side. Wants xray of entire back. Increased urge to urinate.  Medications patient has been prescribed: None  Taking:         Reviewed prior external information including notes and imaging from previsou exam, outside providers and external EMR if available.   As well as notes that were available from care everywhere and other healthcare systems.  Past medical history, social, surgical and family history all reviewed in electronic medical record.  No pertanent information unless stated regarding to the chief complaint.   Past Medical History:  Diagnosis Date   HTN (hypertension)    Syncope     Allergies  Allergen Reactions   Dilaudid [Hydromorphone Hcl] Shortness Of Breath   Phenergan [Promethazine Hcl] Other (See Comments)    Shaky, very out of sorts    Other     HORSE SERUM TETANUS ANTI TOX : PATIENT GETS HIGH FEVER     Review of Systems:  No headache, visual changes, nausea, vomiting, diarrhea, constipation, dizziness, abdominal pain, skin rash, fevers, chills, night sweats, weight loss, swollen lymph nodes, body aches, joint swelling, chest  pain, shortness of breath, mood changes. POSITIVE muscle aches  Objective  Blood pressure (!) 146/98, pulse 90, height '5\' 8"'$  (1.727 m), weight 183 lb (83 kg), SpO2 96 %.   General: No apparent distress alert and oriented x3 mood and affect normal, dressed appropriately.  HEENT: Pupils equal, extraocular movements intact  Respiratory: Patient's speak in full sentences and does not appear short of breath  Cardiovascular: No lower extremity edema, non tender, no erythema  Low back exam does have some loss of lordosis.  Some tenderness to palpation in the paraspinal musculature.  Limited range of motion in all planes.  Does feel like patient does even have some swelling noted on the right side of the paraspinal musculature in the CVA area.  Mild CVA tenderness bilaterally.      Assessment and Plan:  Acute low back pain Acute worsening low back pain.  Seems to be some CVA tenderness, labs as well as UA ordered today for further evaluation.  Discussed which activities to do and which ones to avoid, discussed with patient if worsening pain to seek medical attention.  We will make sure patient does not have a compression fracture with some imaging as well.  Toradol and Depo-Medrol given today.  Held on any type of manipulation today but will consider it at follow-up.  Follow-up again with me in 3 to 4 weeks.        The above documentation has been reviewed and is accurate and complete Edwin Pulley, DO        Note:  This dictation was prepared with Dragon dictation along with smaller phrase technology. Any transcriptional errors that result from this process are unintentional.

## 2021-08-01 ENCOUNTER — Ambulatory Visit (INDEPENDENT_AMBULATORY_CARE_PROVIDER_SITE_OTHER): Payer: Medicare Other

## 2021-08-01 ENCOUNTER — Ambulatory Visit: Payer: Medicare Other | Admitting: Family Medicine

## 2021-08-01 VITALS — BP 146/98 | HR 90 | Ht 68.0 in | Wt 183.0 lb

## 2021-08-01 DIAGNOSIS — M545 Low back pain, unspecified: Secondary | ICD-10-CM | POA: Insufficient documentation

## 2021-08-01 DIAGNOSIS — M5136 Other intervertebral disc degeneration, lumbar region: Secondary | ICD-10-CM | POA: Diagnosis not present

## 2021-08-01 DIAGNOSIS — M549 Dorsalgia, unspecified: Secondary | ICD-10-CM | POA: Diagnosis not present

## 2021-08-01 DIAGNOSIS — M2550 Pain in unspecified joint: Secondary | ICD-10-CM | POA: Diagnosis not present

## 2021-08-01 DIAGNOSIS — M546 Pain in thoracic spine: Secondary | ICD-10-CM | POA: Diagnosis not present

## 2021-08-01 DIAGNOSIS — M542 Cervicalgia: Secondary | ICD-10-CM | POA: Diagnosis not present

## 2021-08-01 LAB — COMPREHENSIVE METABOLIC PANEL
ALT: 18 U/L (ref 0–53)
AST: 18 U/L (ref 0–37)
Albumin: 4.7 g/dL (ref 3.5–5.2)
Alkaline Phosphatase: 59 U/L (ref 39–117)
BUN: 19 mg/dL (ref 6–23)
CO2: 31 mEq/L (ref 19–32)
Calcium: 10.5 mg/dL (ref 8.4–10.5)
Chloride: 98 mEq/L (ref 96–112)
Creatinine, Ser: 0.98 mg/dL (ref 0.40–1.50)
GFR: 79.92 mL/min (ref >60.00)
Glucose, Bld: 105 mg/dL — ABNORMAL HIGH (ref 70–99)
Potassium: 4.4 mEq/L (ref 3.5–5.1)
Sodium: 135 mEq/L (ref 135–145)
Total Bilirubin: 0.6 mg/dL (ref 0.2–1.2)
Total Protein: 7.2 g/dL (ref 6.0–8.3)

## 2021-08-01 LAB — URINALYSIS
Bilirubin Urine: NEGATIVE
Hgb urine dipstick: NEGATIVE
Ketones, ur: NEGATIVE
Leukocytes,Ua: NEGATIVE
Nitrite: NEGATIVE
Specific Gravity, Urine: 1.01 (ref 1.000–1.030)
Total Protein, Urine: NEGATIVE
Urine Glucose: NEGATIVE
Urobilinogen, UA: 0.2 (ref 0.0–1.0)
pH: 7.5 (ref 5.0–8.0)

## 2021-08-01 LAB — CBC WITH DIFFERENTIAL/PLATELET
Basophils Absolute: 0.1 10*3/uL (ref 0.0–0.1)
Basophils Relative: 1.4 % (ref 0.0–3.0)
Eosinophils Absolute: 0.1 10*3/uL (ref 0.0–0.7)
Eosinophils Relative: 1.7 % (ref 0.0–5.0)
HCT: 44.4 % (ref 39.0–52.0)
Hemoglobin: 15 g/dL (ref 13.0–17.0)
Lymphocytes Relative: 27.7 % (ref 12.0–46.0)
Lymphs Abs: 1.9 10*3/uL (ref 0.7–4.0)
MCHC: 33.7 g/dL (ref 30.0–36.0)
MCV: 89.3 fl (ref 78.0–100.0)
Monocytes Absolute: 0.6 10*3/uL (ref 0.1–1.0)
Monocytes Relative: 8.8 % (ref 3.0–12.0)
Neutro Abs: 4.1 10*3/uL (ref 1.4–7.7)
Neutrophils Relative %: 60.4 % (ref 43.0–77.0)
Platelets: 287 10*3/uL (ref 150.0–400.0)
RBC: 4.97 Mil/uL (ref 4.22–5.81)
RDW: 14.2 % (ref 11.5–15.5)
WBC: 6.8 10*3/uL (ref 4.0–10.5)

## 2021-08-01 LAB — PSA: PSA: 2.68 ng/mL (ref 0.10–4.00)

## 2021-08-01 LAB — SEDIMENTATION RATE: Sed Rate: 7 mm/hr (ref 0–20)

## 2021-08-01 LAB — VITAMIN D 25 HYDROXY (VIT D DEFICIENCY, FRACTURES): VITD: 33.93 ng/mL (ref 30.00–100.00)

## 2021-08-01 IMAGING — DX DG THORACIC SPINE 2V
2 series · 2 of 2 positions shown · non-contrast
Comparison: None Available.

CLINICAL DATA: Back pain

EXAM:
THORACIC SPINE 2 VIEWS

[t-spine ap]
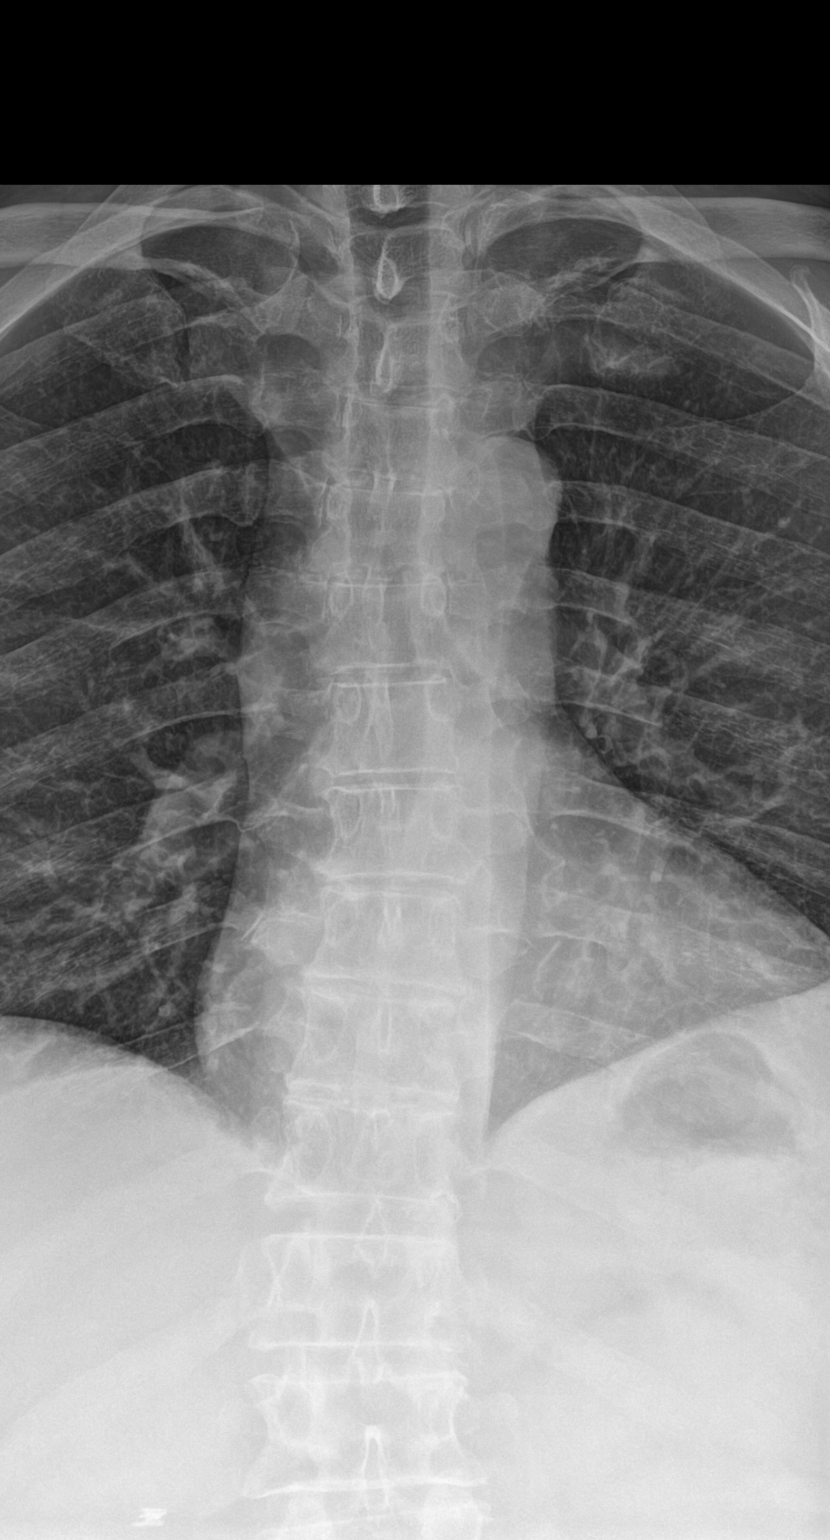

[t-spine lat]
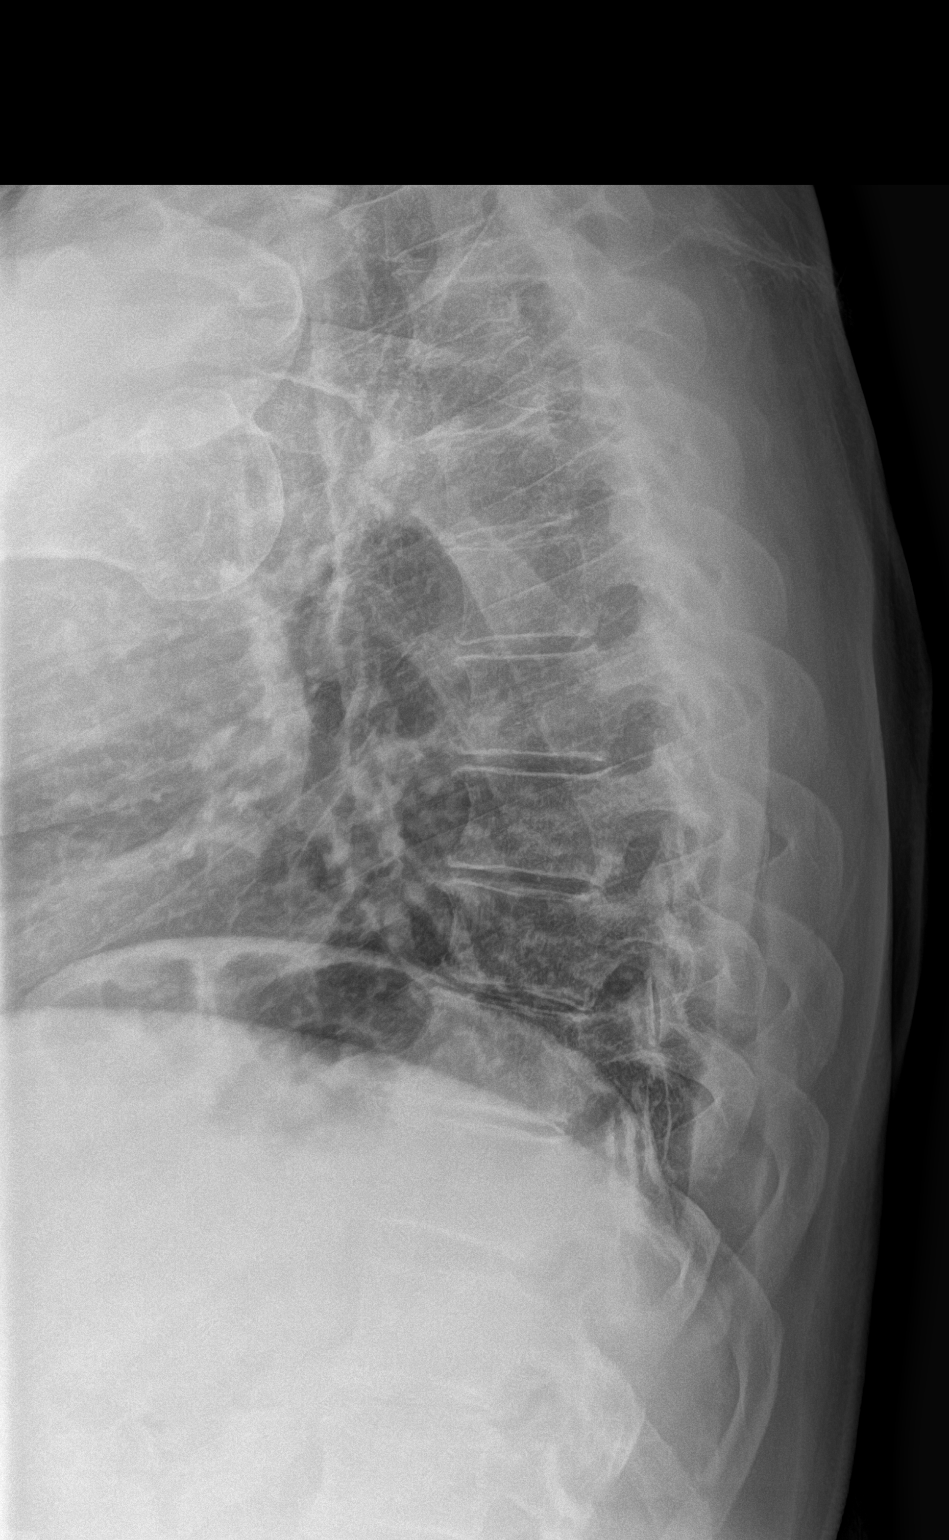

[2 of 2 positions shown; findings below may reference images not displayed]

FINDINGS: Thoracic vertebral body height and alignment are preserved without
fracture or subluxation. Slight S shaped curvature of the thoracic
spine noted. Mild intervertebral disc space narrowing in the lower
thoracic spine.
IMPRESSION: Mild degenerative changes with no acute fracture or malalignment
identified.

## 2021-08-01 IMAGING — DX DG CERVICAL SPINE 2 OR 3 VIEWS
3 series · 3 of 3 positions shown · non-contrast
Comparison: None Available.

CLINICAL DATA: Back pain, neck pain

EXAM:
CERVICAL SPINE - 2-3 VIEW

[c-spine lat]
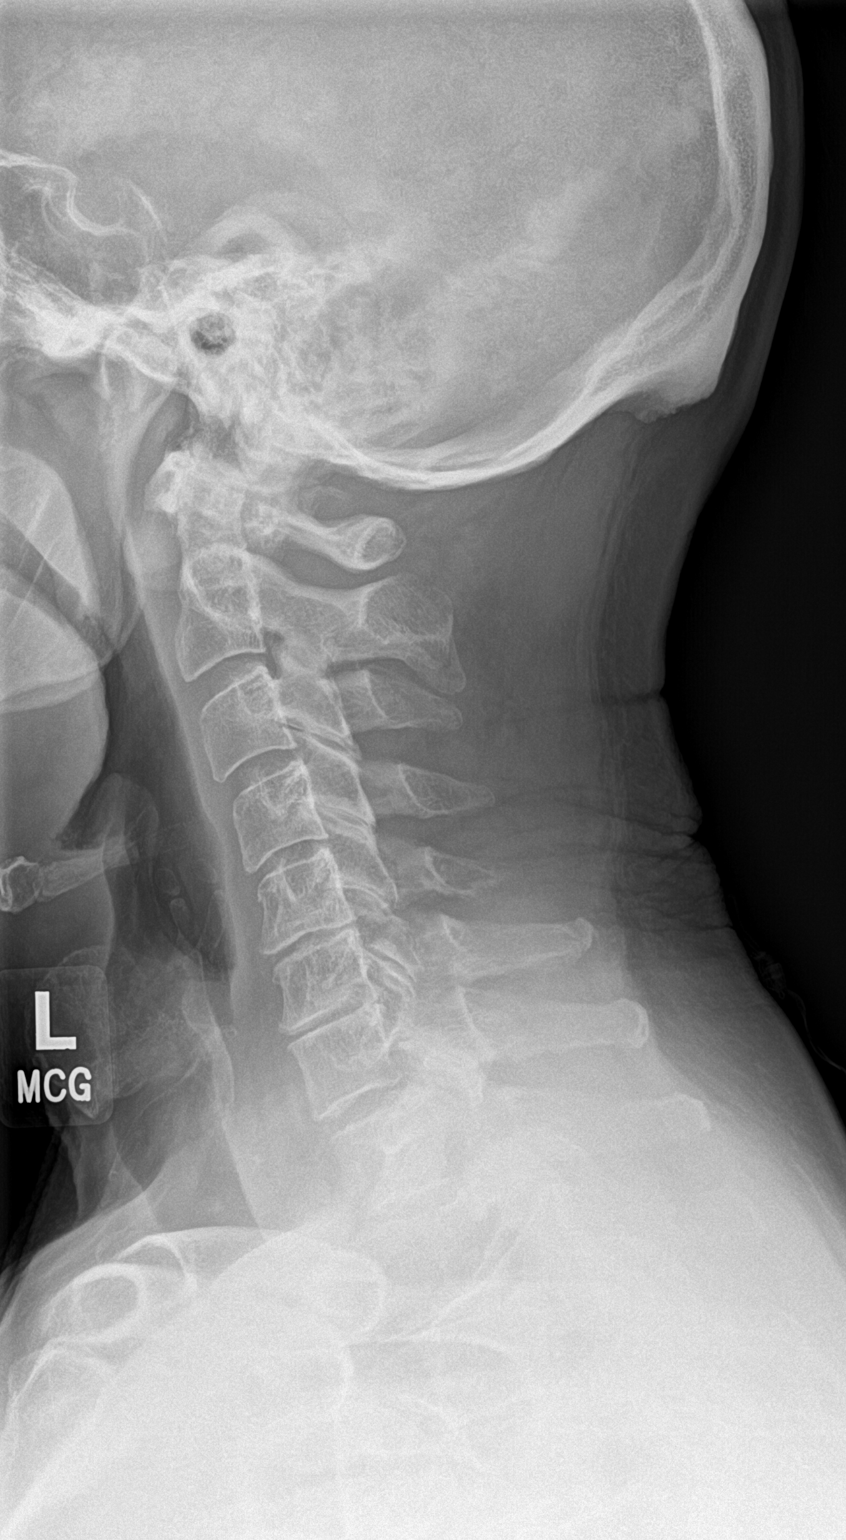

[c-spine ap]
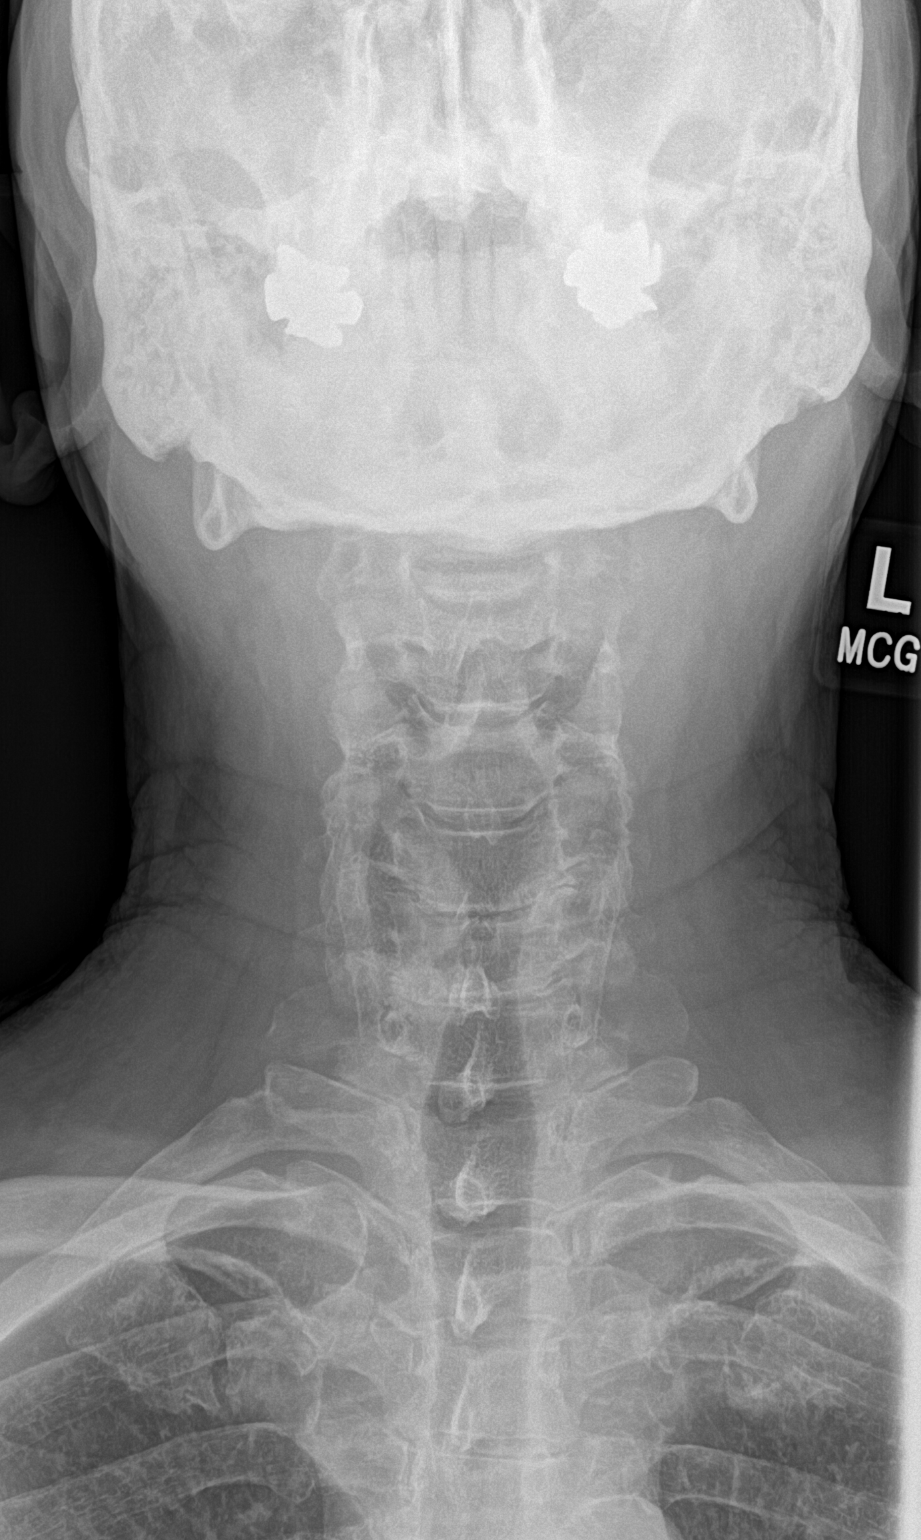

[c-spine open mouth]
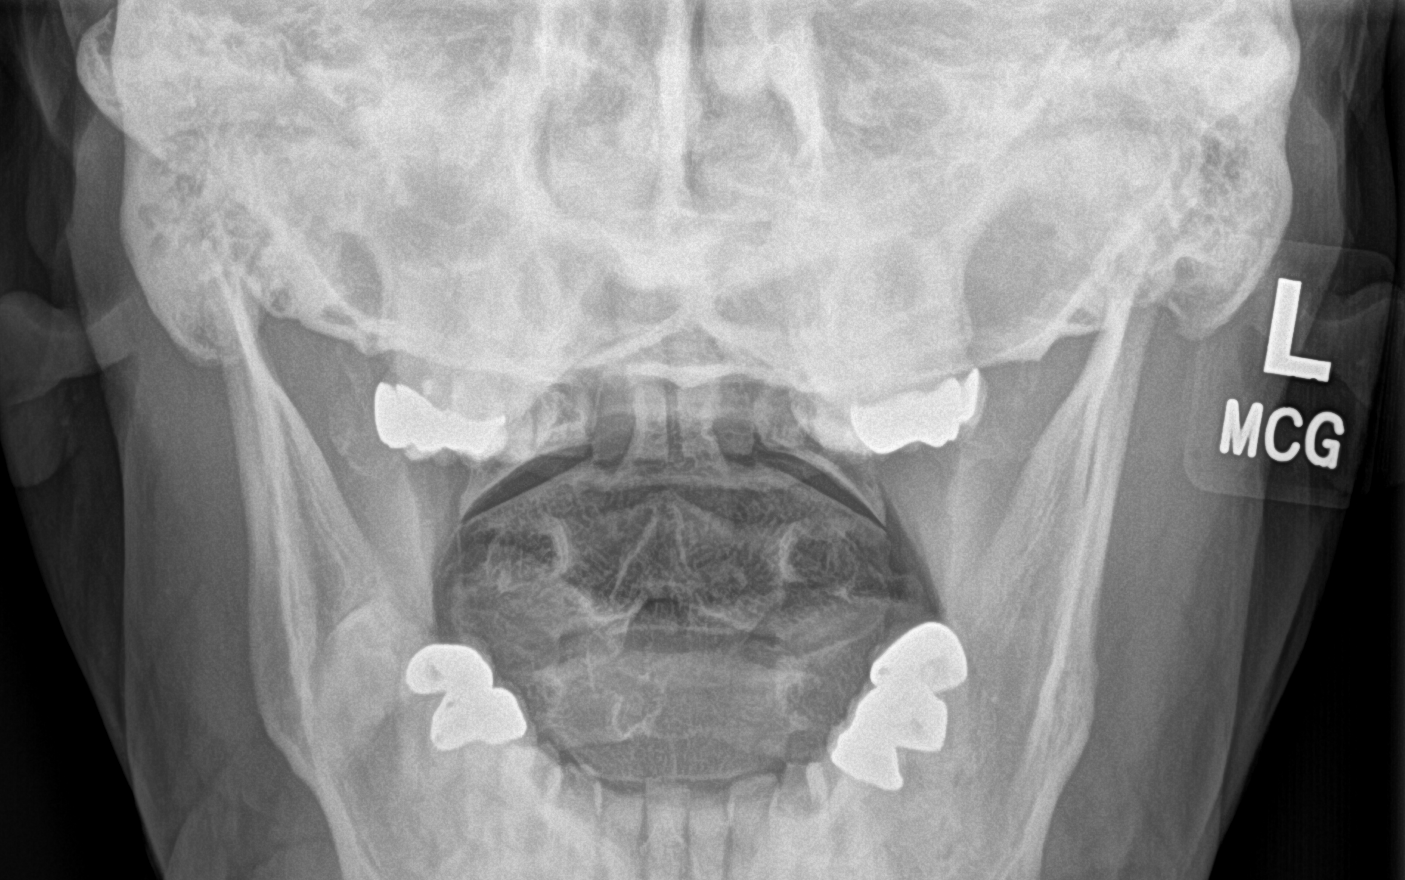

[3 of 3 positions shown; findings below may reference images not displayed]

FINDINGS: Cervical vertebral body height and alignment are preserved without
evidence of fracture or subluxation. Atlantodental interval within
normal limits. Moderate intervertebral disc space narrowing at C5-C6
and C6-C7. Evidence of facet arthropathy. No prevertebral soft
tissue swelling identified.
IMPRESSION: Degenerative changes with no acute fracture or malalignment
identified.

## 2021-08-01 IMAGING — DX DG LUMBAR SPINE 2-3V
3 series · 3 of 3 positions shown · non-contrast
Comparison: None Available.

CLINICAL DATA: Chronic back pain

EXAM:
LUMBAR SPINE - 2-3 VIEW

[l-spine ap]
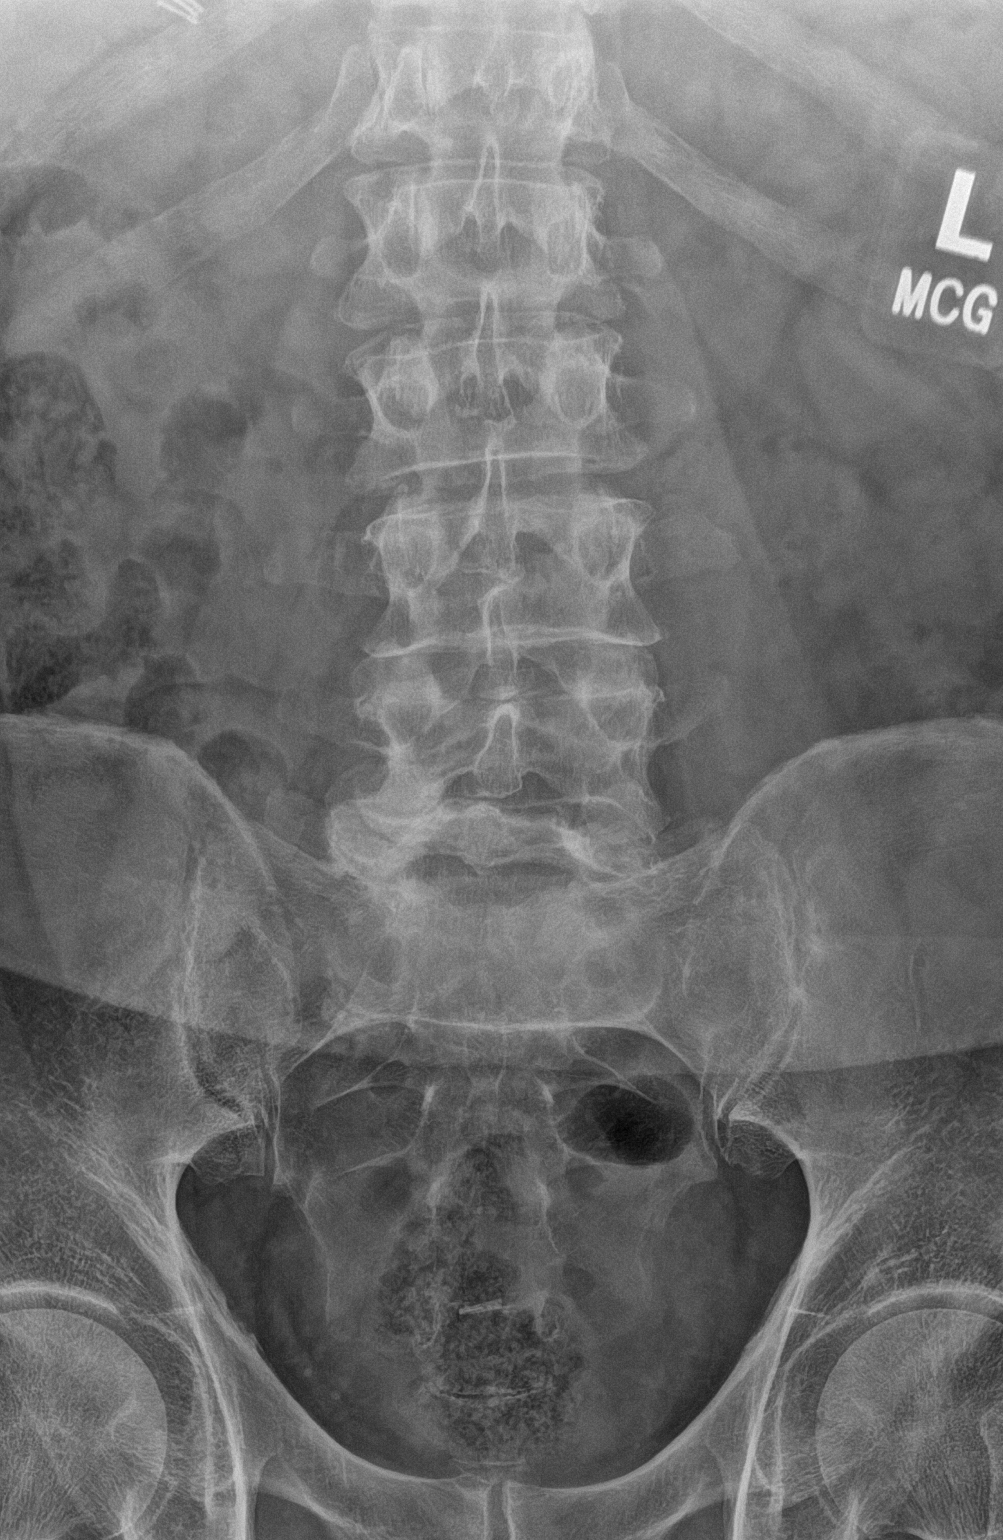

[l-spine lateral]
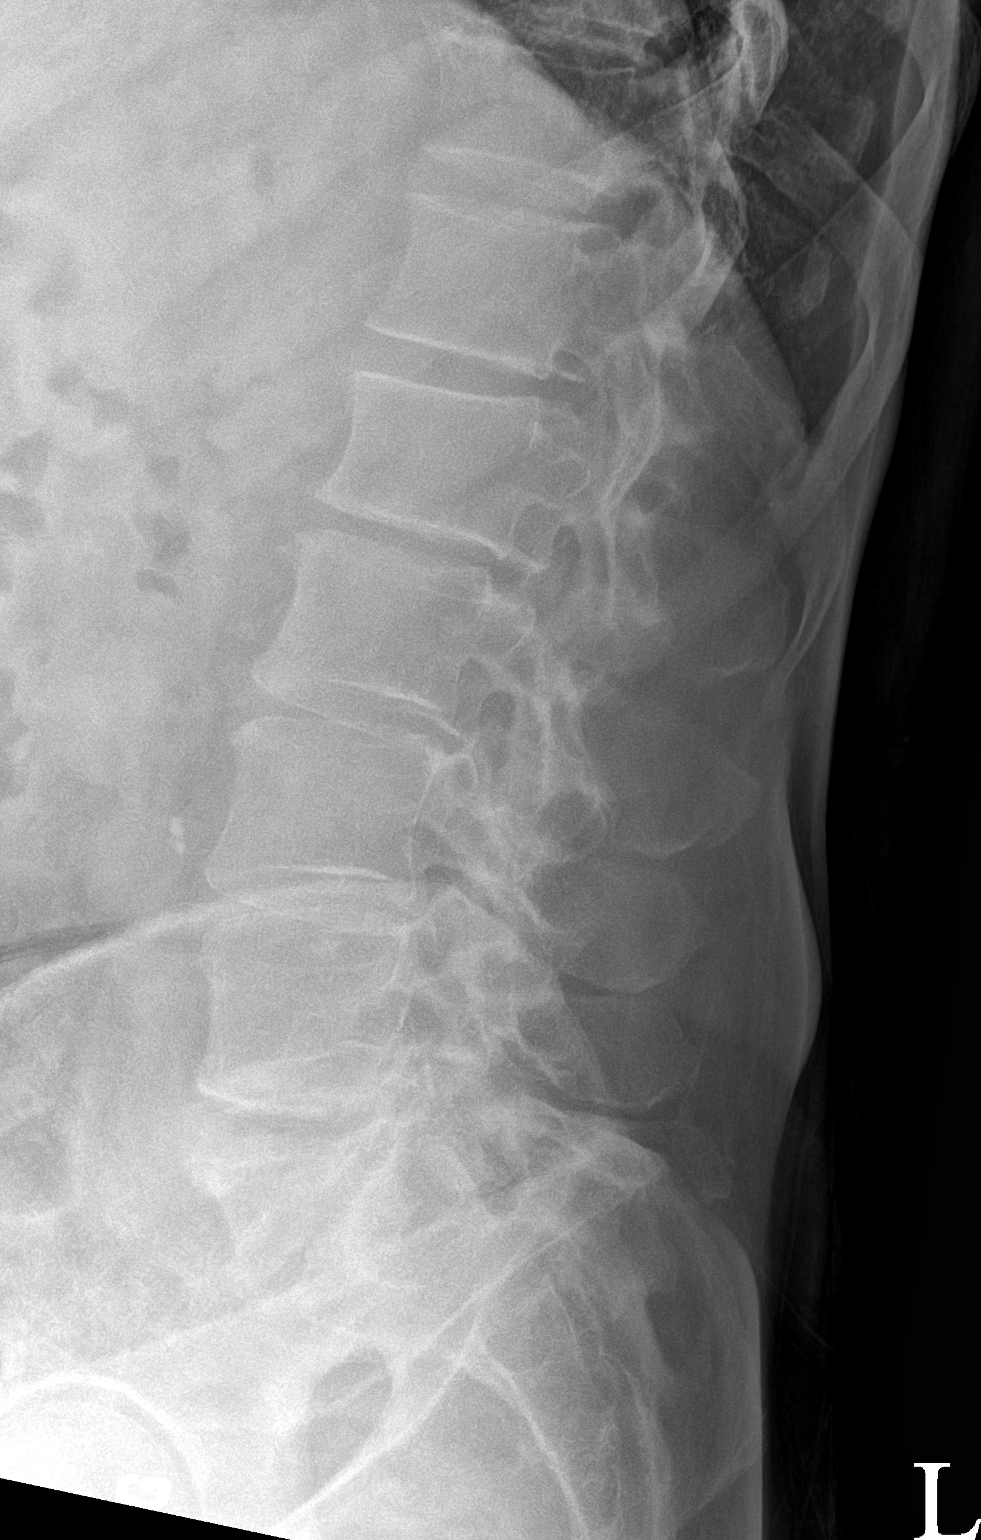

[l-spine spot]
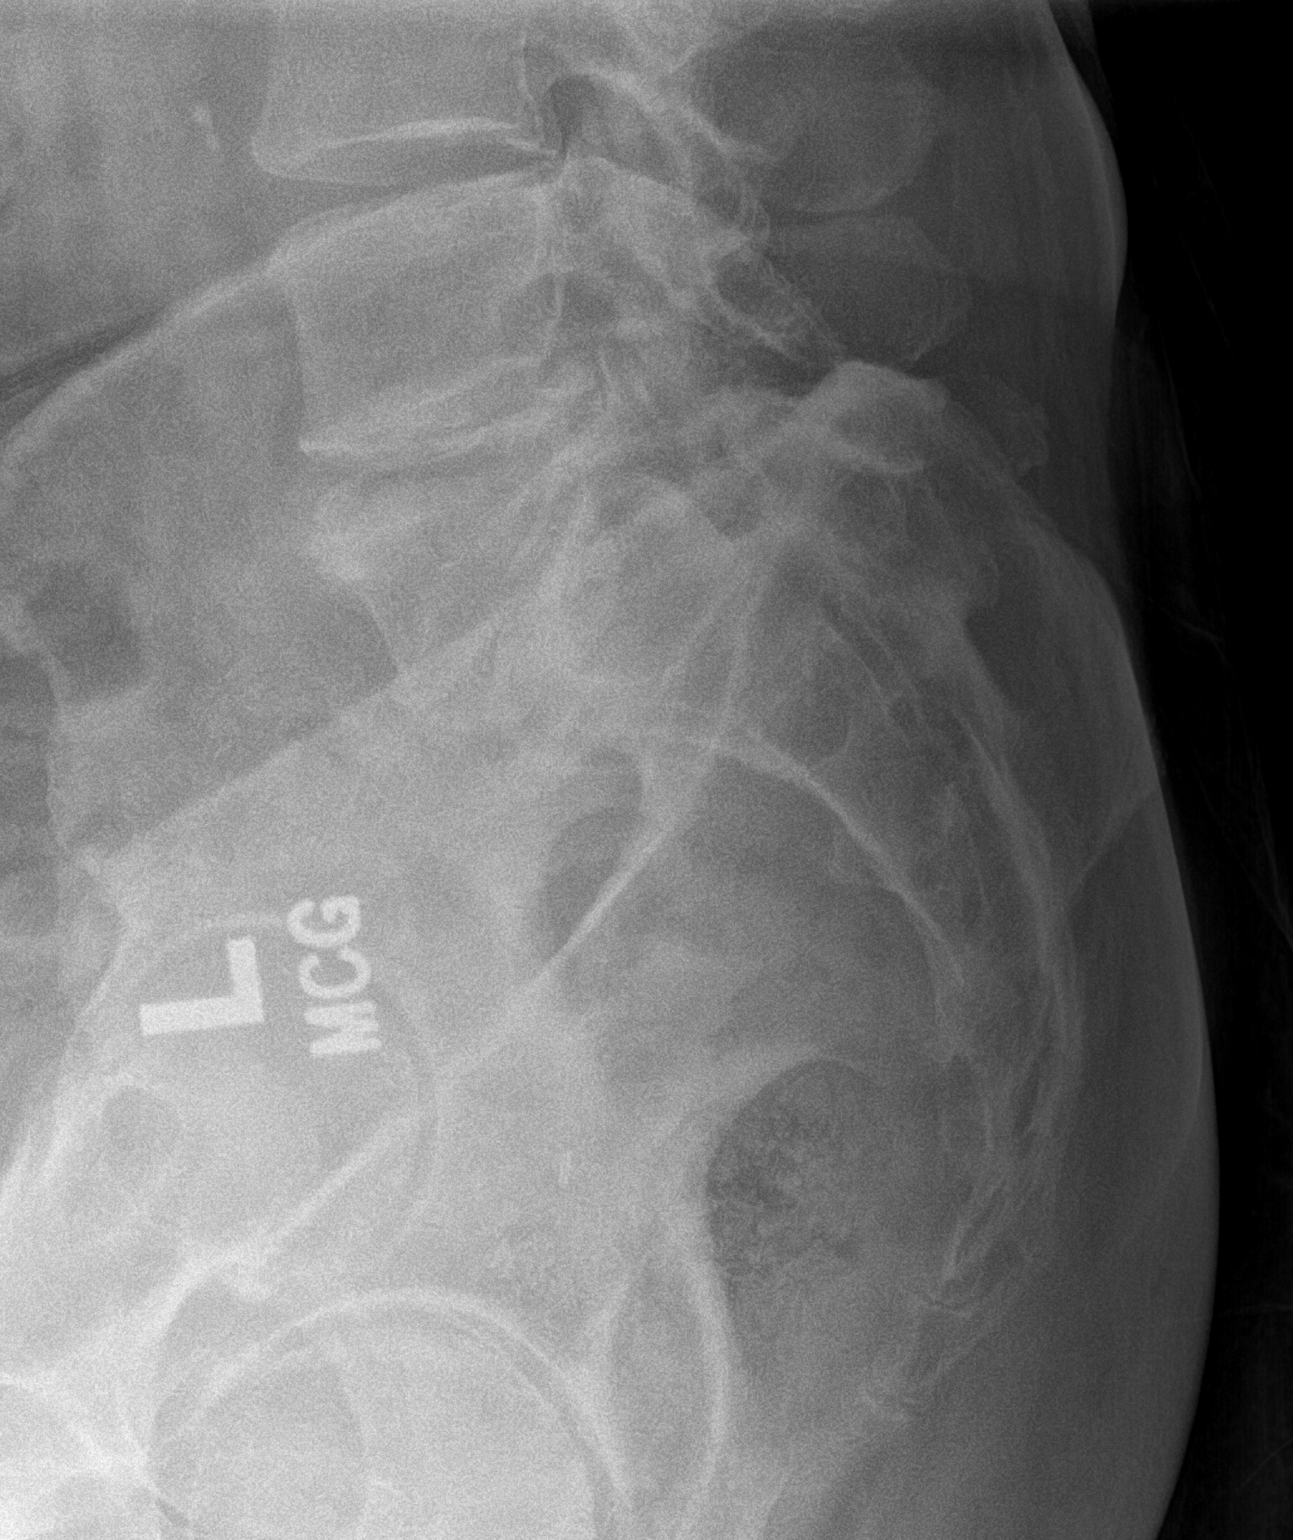

[3 of 3 positions shown; findings below may reference images not displayed]

FINDINGS: No acute fracture identified in the lumbar spine. Grade 1 or 2
anterolisthesis of L5 on S1, limited visualization. Suggestion of
possible spondylolysis at L5. Moderate intervertebral disc space
narrowing at L4-5 and severe narrowing at L5-S1. Facet arthropathy
most prominent in the lower lumbar spine.
IMPRESSION: Advanced degenerative changes of the lower lumbar spine as
described. Consider cross-sectional imaging for further evaluation
as indicated.

## 2021-08-01 MED ORDER — TIZANIDINE HCL 4 MG PO TABS: 4.0000 mg | ORAL_TABLET | Freq: Every day | ORAL | 0 refills | Status: DC

## 2021-08-01 MED ORDER — KETOROLAC TROMETHAMINE 30 MG/ML IJ SOLN
30.0000 mg | Freq: Once | INTRAMUSCULAR | Status: AC
  Administered 2021-08-01: 30 mg via INTRAMUSCULAR

## 2021-08-01 MED ORDER — METHYLPREDNISOLONE ACETATE 40 MG/ML IJ SUSP
40.0000 mg | Freq: Once | INTRAMUSCULAR | Status: AC
  Administered 2021-08-01: 40 mg via INTRAMUSCULAR

## 2021-08-01 NOTE — Patient Instructions (Addendum)
Zanaflex '4mg'$  in the PM for next 3 nights then as needed Labs today Xrays today  See you again in 3-4 weeks (double book ok)

## 2021-08-01 NOTE — Assessment & Plan Note (Signed)
Acute worsening low back pain.  Seems to be some CVA tenderness, labs as well as UA ordered today for further evaluation.  Discussed which activities to do and which ones to avoid, discussed with patient if worsening pain to seek medical attention.  We will make sure patient does not have a compression fracture with some imaging as well.  Toradol and Depo-Medrol given today.  Held on any type of manipulation today but will consider it at follow-up.  Follow-up again with me in 3 to 4 weeks.

## 2021-08-02 NOTE — Progress Notes (Signed)
Left message for patient regarding normal labwork.

## 2021-08-03 ENCOUNTER — Telehealth: Payer: Self-pay

## 2021-08-03 NOTE — Telephone Encounter (Signed)
Patients spouse called stating that Patient came home from work because of the pain in his back and right hip was so bad that he cannot even stand up straight. They would like a call back on spouses phone number to discuss if needing to be seen or what to do, because he needs something.

## 2021-08-04 ENCOUNTER — Other Ambulatory Visit: Payer: Self-pay

## 2021-08-04 ENCOUNTER — Other Ambulatory Visit (HOSPITAL_COMMUNITY): Payer: Self-pay

## 2021-08-04 DIAGNOSIS — M25551 Pain in right hip: Secondary | ICD-10-CM

## 2021-08-04 DIAGNOSIS — M545 Low back pain, unspecified: Secondary | ICD-10-CM

## 2021-08-04 MED ORDER — PREDNISONE 20 MG PO TABS
40.0000 mg | ORAL_TABLET | Freq: Every day | ORAL | 0 refills | Status: DC
Start: 2021-08-04 — End: 2021-08-08
  Filled 2021-08-04: qty 10, 5d supply, fill #0

## 2021-08-04 NOTE — Telephone Encounter (Signed)
Spoke with patient. MRI ordered and prednisone called in.

## 2021-08-05 ENCOUNTER — Other Ambulatory Visit (HOSPITAL_COMMUNITY): Payer: Self-pay

## 2021-08-05 NOTE — Telephone Encounter (Signed)
Patient called regarding the predniSONE (DELTASONE) 20 MG tablet that was sent in. He asked if it could be sent to the [facility] on [address] instead?  Please advise.  (I told him that Dr Tamala Julian was not in the office today)

## 2021-08-08 MED ORDER — PREDNISONE 20 MG PO TABS: 40.0000 mg | ORAL_TABLET | Freq: Every day | ORAL | 0 refills | Status: DC

## 2021-08-08 NOTE — Addendum Note (Signed)
Addended by: [name] on: 08/08/2021 02:01 PM   Modules accepted: Orders

## 2021-08-08 NOTE — Telephone Encounter (Signed)
Tried now

## 2021-08-11 ENCOUNTER — Ambulatory Visit: Payer: Medicare Other | Admitting: Family Medicine

## 2021-08-11 ENCOUNTER — Other Ambulatory Visit: Payer: Self-pay

## 2021-08-11 DIAGNOSIS — M545 Low back pain, unspecified: Secondary | ICD-10-CM | POA: Diagnosis not present

## 2021-08-11 MED ORDER — METHYLPREDNISOLONE ACETATE 80 MG/ML IJ SUSP
80.0000 mg | Freq: Once | INTRAMUSCULAR | Status: AC
  Administered 2021-08-11: 80 mg via INTRAMUSCULAR

## 2021-08-11 MED ORDER — KETOROLAC TROMETHAMINE 30 MG/ML IJ SOLN
30.0000 mg | Freq: Once | INTRAMUSCULAR | Status: AC
  Administered 2021-08-11: 30 mg via INTRAMUSCULAR

## 2021-08-11 NOTE — Assessment & Plan Note (Signed)
Worsening pain with patient having worsening radicular symptoms.  Prednisone did help with the isolated back pain but did not help with any of the radicular symptoms.  Patient is still not working at the moment which activities continue to do get advanced imaging.  Need to rule out a compression fracture, occult fractures of the pelvis, or any herniated disc causing significant nerve irritation.  Toradol and Depo-Medrol given today.  Patient has pain medications as well if necessary.  We discussed if significant worsening pain to seek medical attention immediately.  Patient will follow-up with me again after imaging to discuss different treatment options including possible epidurals or if need for surgical intervention.

## 2021-08-11 NOTE — Patient Instructions (Signed)
Injections in backside today [facility] [phone] Take gabapentin 200-'300mg'$  at night

## 2021-08-11 NOTE — Progress Notes (Signed)
[name] Phone: [phone] Subjective:   [name], am serving as a scribe for Dr. Hulan Saas.   I'm seeing this patient by the request  of:  [name], MD  CC: Low back pain  ASN:[id number]  Edwin Brown is a 66 y.o. male coming in with complaint of low back pain follow-up.  Patient was having significant pain that was from the neck to the lower back.  Seem to worsen in the lower back.  Patient has had an order for MRI of the lumbar and pelvis but this has not been scheduled yet.Patient was given prednisone.  Patient states that he is not getting better. Prednisone did not help as much as he hoped. Patient having hard time working and sleeping due to pain. Took oxy and muscle relaxer last night. Patient has not scheduled MRI as he was waiting on them to call.     Patient's x-rays at last exam were independently visualized by me showing the patient does have a grade 2 anterior listhesis of L5 on S1 as well as significant facet arthropathy of the lumbar spine.    Past Medical History:  Diagnosis Date   HTN (hypertension)    Syncope    Past Surgical History:  Procedure Laterality Date   APPENDECTOMY     CHOLECYSTECTOMY N/A 01/30/2014   Procedure: LAPAROSCOPIC CHOLECYSTECTOMY WITH INTRAOPERATIVE CHOLANGIOGRAM;  Surgeon: Pedro Earls, MD;  Location: [facility];  Service: General;  Laterality: N/A;   NASAL SINUS SURGERY     VASECTOMY     Social History   Socioeconomic History   Marital status: Married    Spouse name: Not on file   Number of children: 3   Years of education: Not on file   Highest education level: Not on file  Occupational History   Occupation: farrier  Tobacco Use   Smoking status: Never   Smokeless tobacco: Never  Substance and Sexual Activity   Alcohol use: Yes    Comment: ONCE A DAY    Drug use: No   Sexual activity: Not on file  Other Topics Concern   Not on  file  Social History Narrative   Not on file   Social Determinants of Health   Financial Resource Strain: Not on file  Food Insecurity: Not on file  Transportation Needs: Not on file  Physical Activity: Not on file  Stress: Not on file  Social Connections: Not on file   Allergies  Allergen Reactions   Dilaudid [Hydromorphone Hcl] Shortness Of Breath   Phenergan [Promethazine Hcl] Other (See Comments)    Shaky, very out of sorts    Other     HORSE SERUM TETANUS ANTI TOX : PATIENT GETS HIGH FEVER   Family History  Problem Relation Age of Onset   Heart attack Father    Heart attack Brother     Current Outpatient Medications (Endocrine & Metabolic):    predniSONE (DELTASONE) 20 MG tablet, Take 2 tablets (40 mg total) by mouth daily with breakfast.  Current Outpatient Medications (Cardiovascular):    losartan (COZAAR) 100 MG tablet, Take 100 mg by mouth daily.  Current Outpatient Medications (Respiratory):    albuterol (PROVENTIL HFA;VENTOLIN HFA) 108 (90 BASE) MCG/ACT inhaler, Inhale 1 puff into the lungs every 6 (six) hours as needed for wheezing or shortness of breath.   loratadine (CLARITIN) 10 MG tablet, Take 10 mg by mouth daily as needed for allergies.  Current Outpatient Medications (Analgesics):    traMADol (ULTRAM) 50 MG tablet, Take 50 mg by mouth every 4 (four) hours as needed for moderate pain.   Current Outpatient Medications (Other):    ALPRAZolam (XANAX) 0.25 MG tablet, Take 0.25 mg by mouth at bedtime as needed for anxiety.   AMBULATORY NON FORMULARY MEDICATION, Left wrist brace   clarithromycin (BIAXIN) 500 MG tablet, Take 500 mg by mouth 2 (two) times daily. For 10 days   Diclofenac Sodium 2 % SOLN, Apply 1 pump twice daily.   Glucos-MSM-C-Mn-Ginger-Willow (GLUCOSAMINE MSM COMPLEX PO), Take 2 capsules by mouth daily.    tiZANidine (ZANAFLEX) 4 MG tablet, Take 1 tablet (4 mg total) by mouth at bedtime.   Turmeric Curcumin 500 MG CAPS, Take 1,000 mg by  mouth daily.   Reviewed prior external information including notes and imaging from  primary care provider As well as notes that were available from care everywhere and other healthcare systems.  Past medical history, social, surgical and family history all reviewed in electronic medical record.  No pertanent information unless stated regarding to the chief complaint.   Review of Systems:  No headache, visual changes, nausea, vomiting, diarrhea, constipation, dizziness, abdominal pain, skin rash, fevers, chills, night sweats, weight loss, swollen lymph nodes, body aches, joint swelling, chest pain, shortness of breath, mood changes. POSITIVE muscle aches  Objective  Blood pressure 134/88, pulse 88, height '5\' 8"'$  (1.727 m), weight 180 lb (81.6 kg), SpO2 99 %.   General: No apparent distress alert and oriented x3 mood and affect normal, dressed appropriately.  HEENT: Pupils equal, extraocular movements intact  Respiratory: Patient's speak in full sentences and does not appear short of breath  Cardiovascular: No lower extremity edema, non tender, no erythema  Gait normal with good balance and coordination.  MSK: Positive straight leg test on the right side.  Does have some radicular symptoms noted.  Patient neurovascularly intact distally.  Patient has some voluntary and involuntary guarding of the lower back.  Tenderness to palpation in the paraspinal musculature of the back right greater than left as well as in the piriformis area.    Impression and Recommendations:     The above documentation has been reviewed and is accurate and complete [name], DO

## 2021-08-14 ENCOUNTER — Other Ambulatory Visit: Payer: Medicare Other

## 2021-08-15 ENCOUNTER — Ambulatory Visit (INDEPENDENT_AMBULATORY_CARE_PROVIDER_SITE_OTHER): Payer: Medicare Other

## 2021-08-15 DIAGNOSIS — M25551 Pain in right hip: Secondary | ICD-10-CM

## 2021-08-15 DIAGNOSIS — M25552 Pain in left hip: Secondary | ICD-10-CM

## 2021-08-15 DIAGNOSIS — G8929 Other chronic pain: Secondary | ICD-10-CM | POA: Diagnosis not present

## 2021-08-15 DIAGNOSIS — M5416 Radiculopathy, lumbar region: Secondary | ICD-10-CM | POA: Diagnosis not present

## 2021-08-15 DIAGNOSIS — M545 Low back pain, unspecified: Secondary | ICD-10-CM

## 2021-08-15 DIAGNOSIS — M48061 Spinal stenosis, lumbar region without neurogenic claudication: Secondary | ICD-10-CM | POA: Diagnosis not present

## 2021-08-15 DIAGNOSIS — M5136 Other intervertebral disc degeneration, lumbar region: Secondary | ICD-10-CM | POA: Diagnosis not present

## 2021-08-15 DIAGNOSIS — M5116 Intervertebral disc disorders with radiculopathy, lumbar region: Secondary | ICD-10-CM | POA: Diagnosis not present

## 2021-08-15 DIAGNOSIS — M160 Bilateral primary osteoarthritis of hip: Secondary | ICD-10-CM | POA: Diagnosis not present

## 2021-08-15 DIAGNOSIS — M5137 Other intervertebral disc degeneration, lumbosacral region: Secondary | ICD-10-CM | POA: Diagnosis not present

## 2021-08-15 DIAGNOSIS — M4319 Spondylolisthesis, multiple sites in spine: Secondary | ICD-10-CM | POA: Diagnosis not present

## 2021-08-15 IMAGING — MR MR PELVIS W/O CM
5 series · 48 of 48 positions shown · non-contrast
Comparison: CT pelvis [DATE]

CLINICAL DATA: Bilateral hip pain, right greater than left, for 3
weeks.

EXAM:
MRI PELVIS WITHOUT CONTRAST
TECHNIQUE: Multiplanar multisequence MR imaging of the pelvis was performed. No
intravenous contrast was administered.

[Series 2: STIR · coronal · 4.0mm · 0.70mm/px · 7 of 40 slices shown]
[im 1/40]
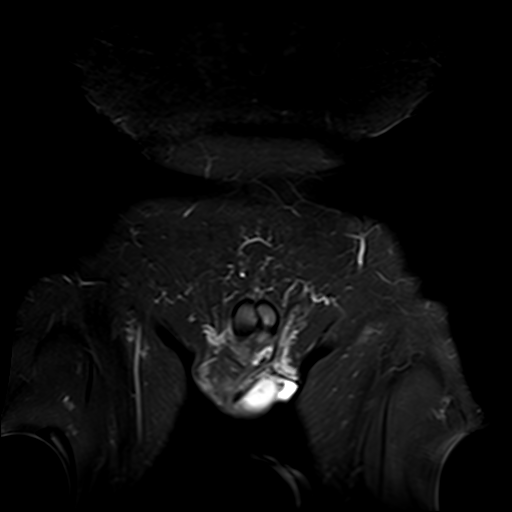
[im 7/40]
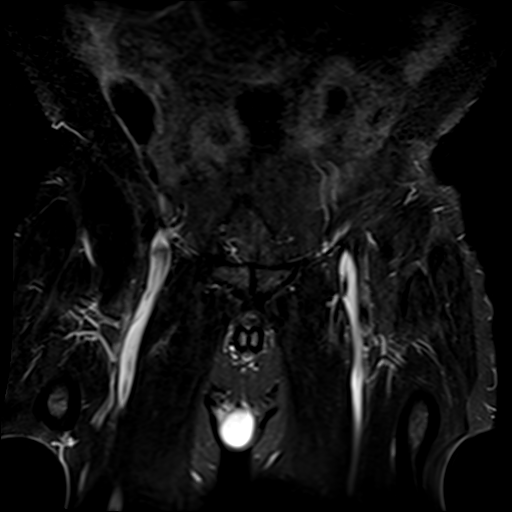
[im 14/40]
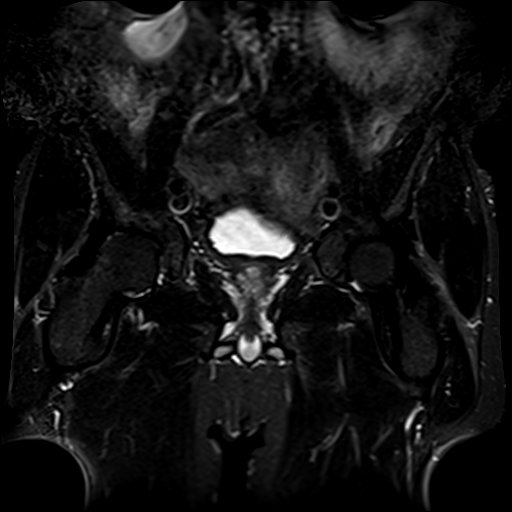
[im 20/40]
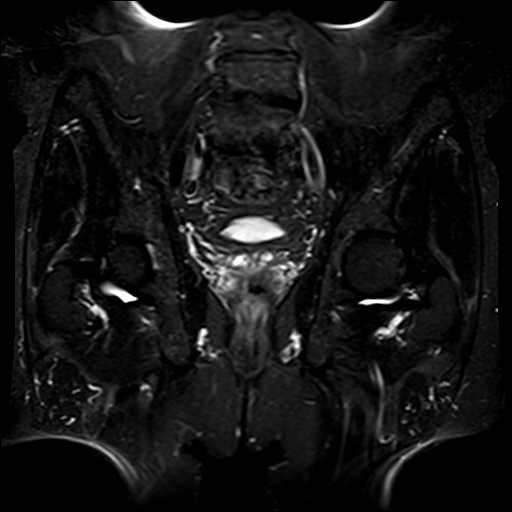
[im 27/40]
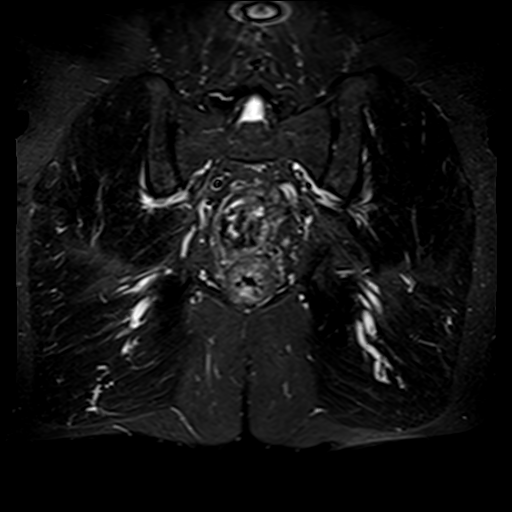
[im 33/40]
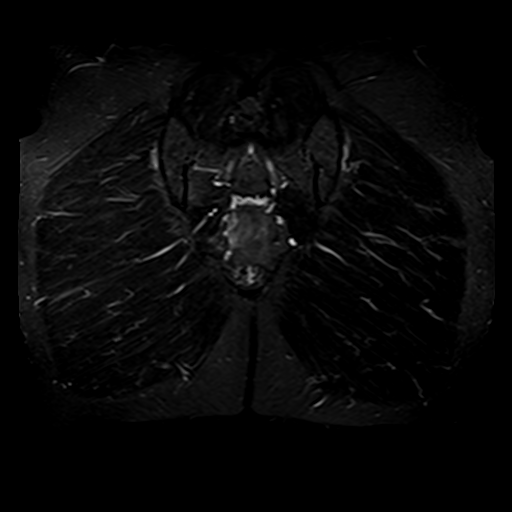
[im 40/40]
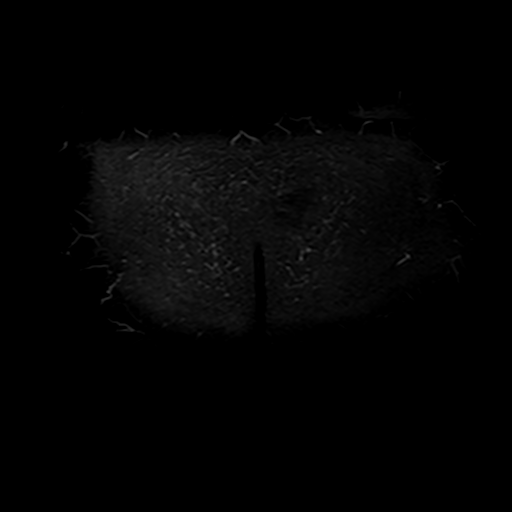

[Series 3: T1 · coronal · 4.0mm · 1.41mm/px · 8 of 40 slices shown (1 of 2)]
[im 1/40]
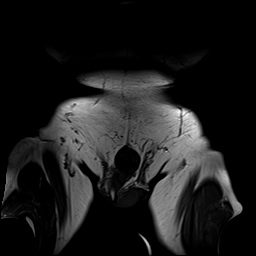
[im 6/40]
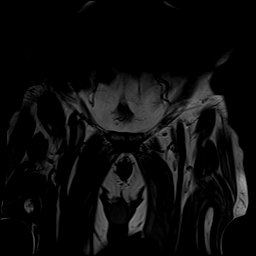
[im 12/40]
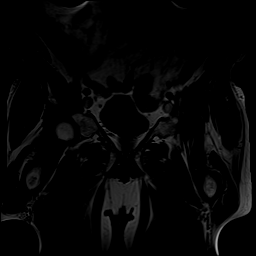
[im 17/40]
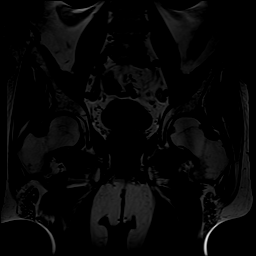
[im 23/40]
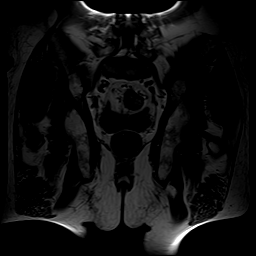
[im 28/40]
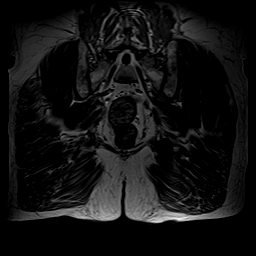
[im 34/40]
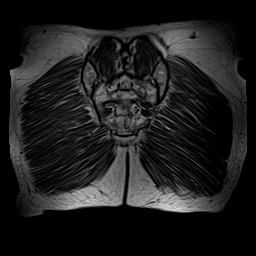
[im 40/40]
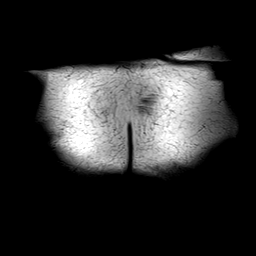

[Series 4: T1 · axial · 4.0mm · 1.48mm/px · z∈[-209,+46]mm · 10 of 52 slices shown (2 of 2)]
[im 1/52]
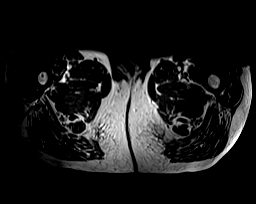
[im 6/52]
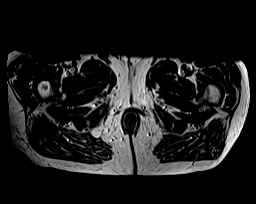
[im 12/52]
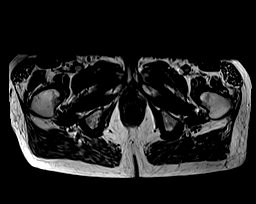
[im 18/52]
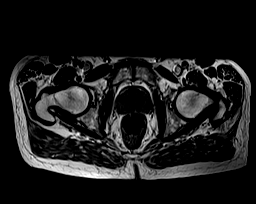
[im 23/52]
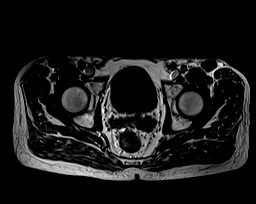
[im 29/52]
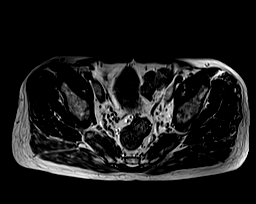
[im 35/52]
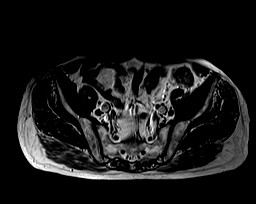
[im 40/52]
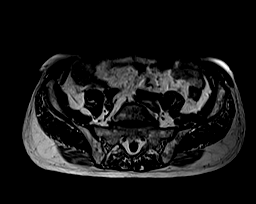
[im 46/52]
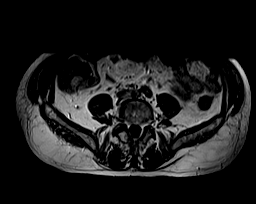
[im 52/52]
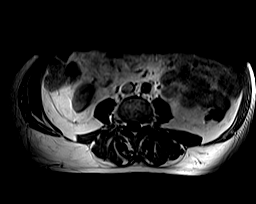

[Series 5: axial t2fs (pelvis) · axial · 4.0mm · 1.48mm/px · z∈[-209,+46]mm · 10 of 52 slices shown]
[im 1/52]
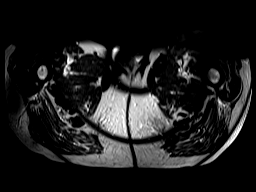
[im 6/52]
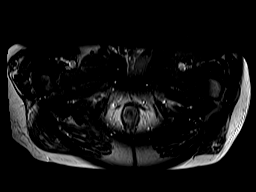
[im 12/52]
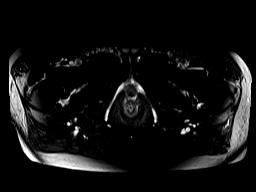
[im 18/52]
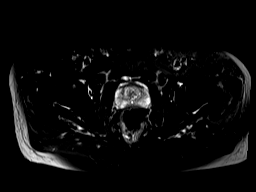
[im 23/52]
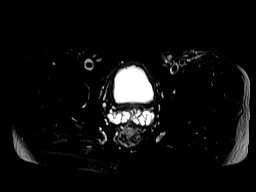
[im 29/52]
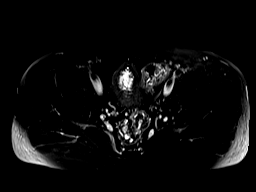
[im 35/52]
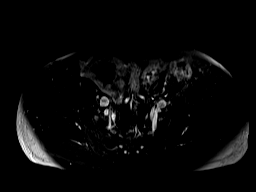
[im 40/52]
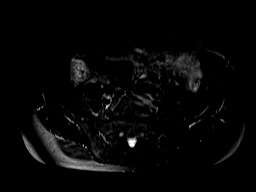
[im 46/52]
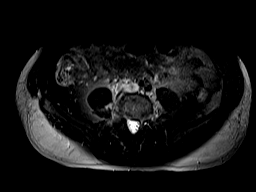
[im 52/52]
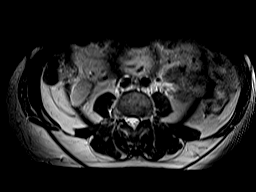

[Series 6: T2 fat-sat · sagittal · 4.0mm · 1.09mm/px · 13 of 67 slices shown]
[im 1/67]
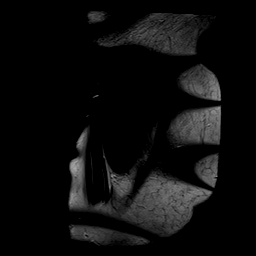
[im 6/67]
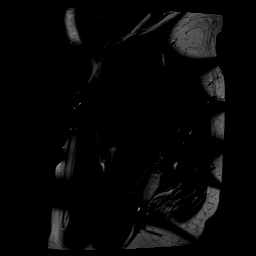
[im 12/67]
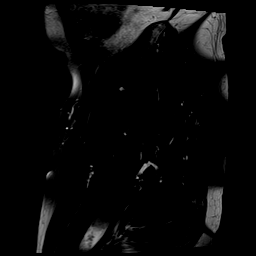
[im 17/67]
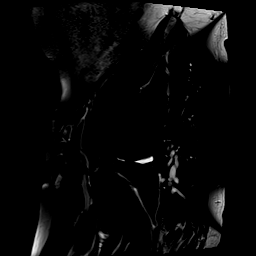
[im 23/67]
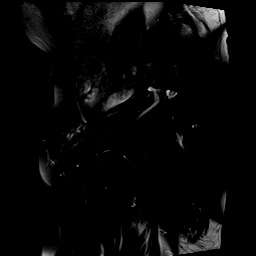
[im 28/67]
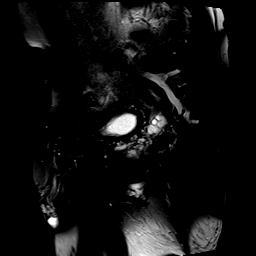
[im 34/67]
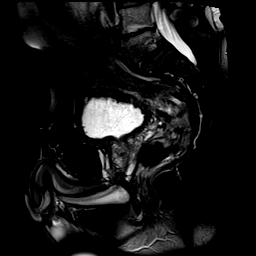
[im 39/67]
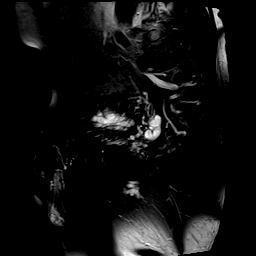
[im 45/67]
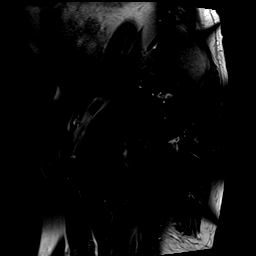
[im 50/67]
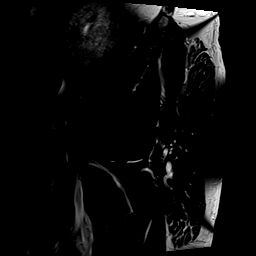
[im 56/67]
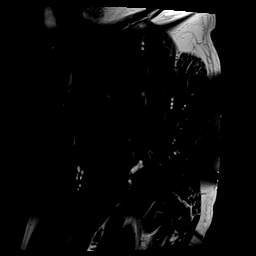
[im 61/67]
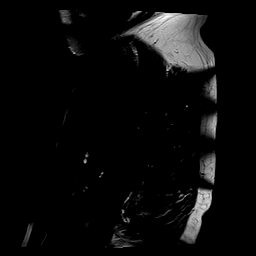
[im 67/67]
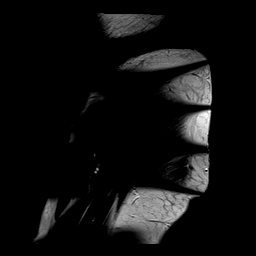

[48 of 48 positions shown; findings below may reference images not displayed]

FINDINGS: Osseous structures and joints: Pars defects at L5 with
anterolisthesis of L5 on S1 and degenerative disc disease at L4-5.
This will be discussed by Dr. MAVIE in his dedicated report on the
lumbar spine also performed on [DATE].

No significant marrow edema in the pelvis or hips. Mild to moderate
degenerative chondral thinning in both hips with mild spurring of
the acetabula.

Musculotendinous: Unremarkable

Soft tissues/other: Unremarkable
IMPRESSION: 1. Mild degenerative hip arthropathy bilaterally. Otherwise no
significant findings in the pelvis to specifically cause the
patient's hip pain.

## 2021-08-15 IMAGING — MR MR LUMBAR SPINE W/O CM
4 of 5 series · 26 of 48 positions shown · non-contrast
Comparison: Lumbar radiography [DATE]

CLINICAL DATA: Lumbar radiculopathy with increased fracture risk.

EXAM:
MRI LUMBAR SPINE WITHOUT CONTRAST
TECHNIQUE: Multiplanar, multisequence MR imaging of the lumbar spine was
performed. No intravenous contrast was administered.

[Series 2: T2 · sagittal · 4.0mm · 0.81mm/px · 6 of 15 slices shown (1 of 2)]
[im 1/15]
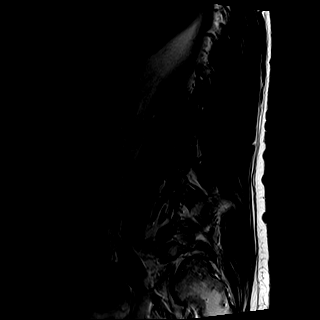
[im 3/15]
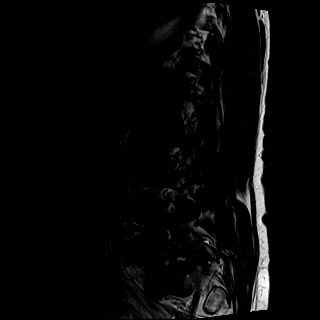
[im 6/15]
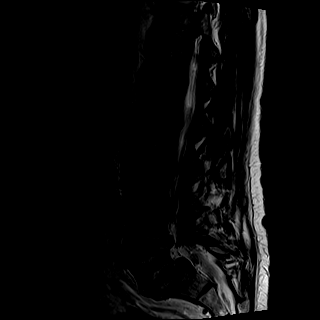
[im 9/15]
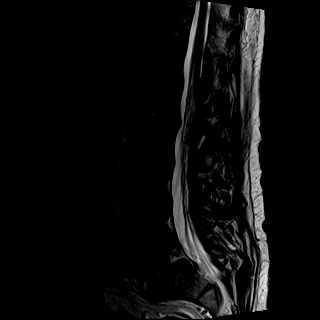
[im 12/15]
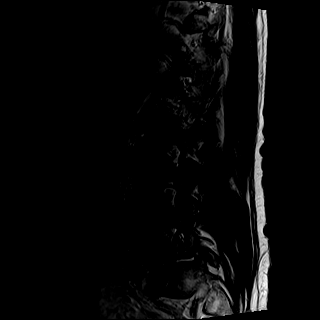
[im 15/15]
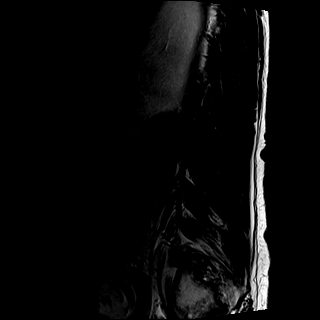

[Series 3: T1 · sagittal · 4.0mm · 0.41mm/px · 6 of 15 slices shown (1 of 2)]
[im 1/15]
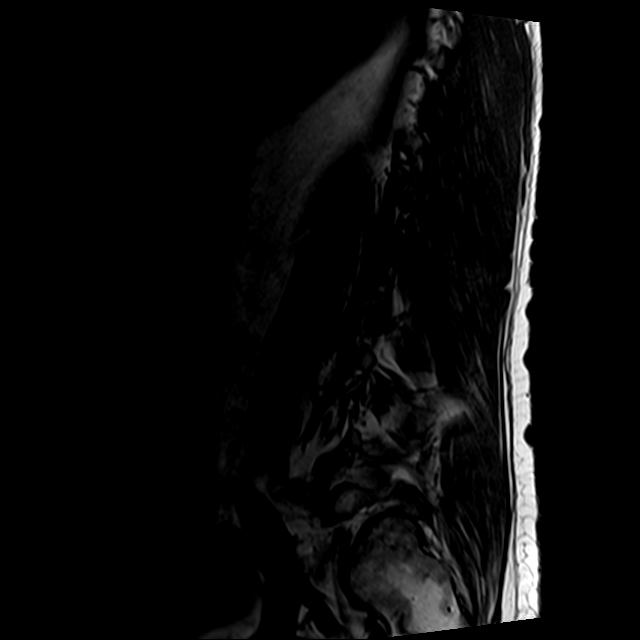
[im 3/15]
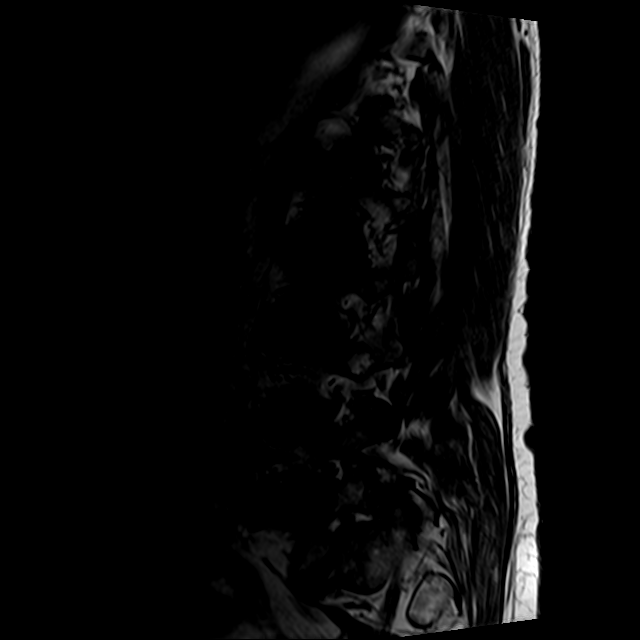
[im 6/15]
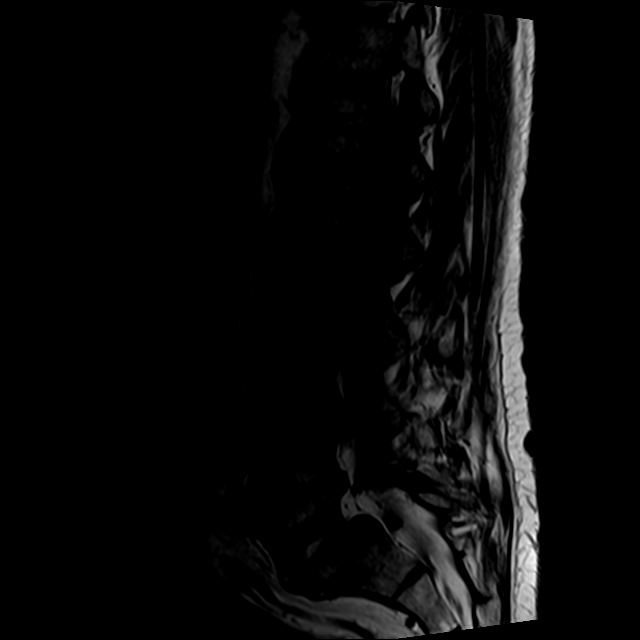
[im 9/15]
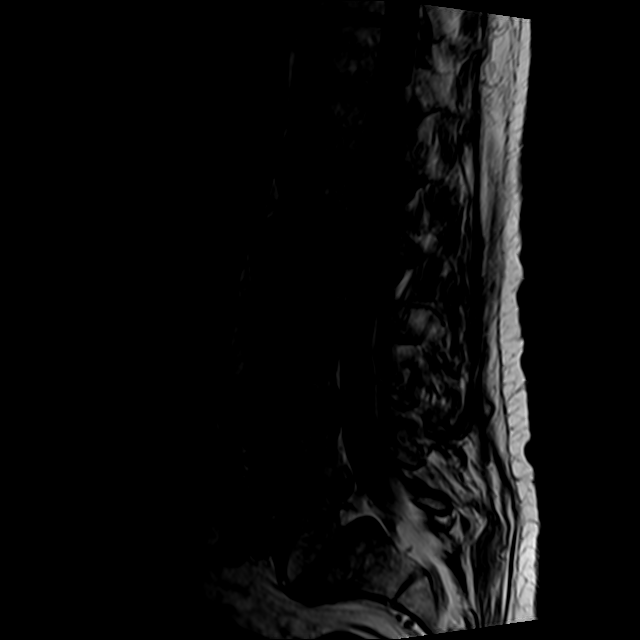
[im 12/15]
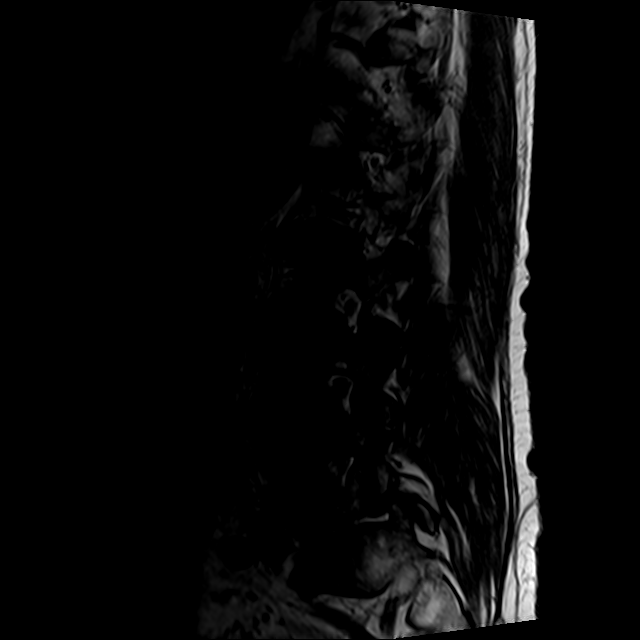
[im 15/15]
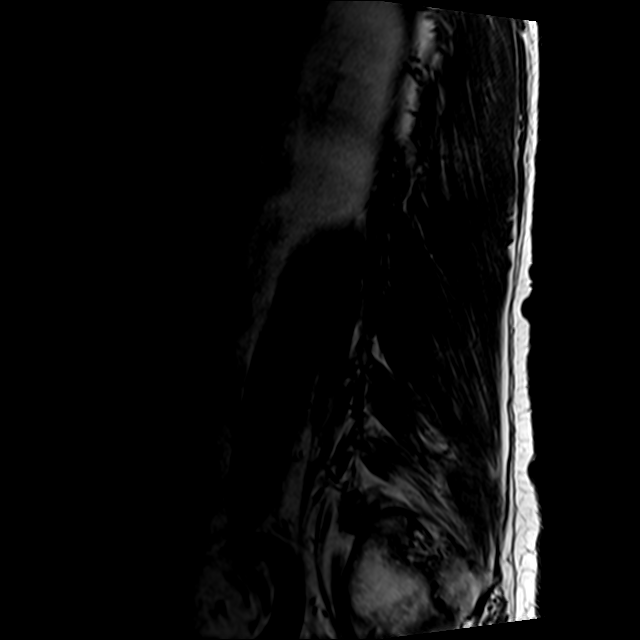

[Series 5: T2 · axial · 4.0mm · 0.78mm/px · z∈[-65,+160]mm · 9 of 39 slices shown (2 of 2)]
[im 1/39]
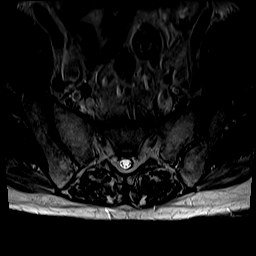
[im 6/39]
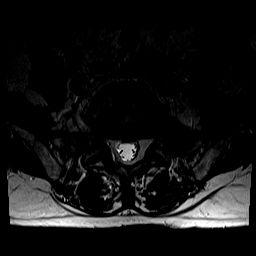
[im 11/39]
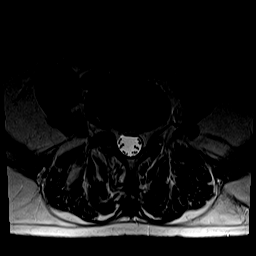
[im 17/39]
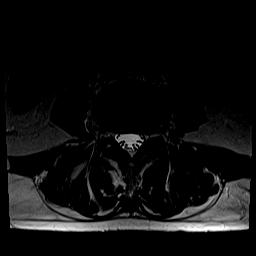
[im 20/39]
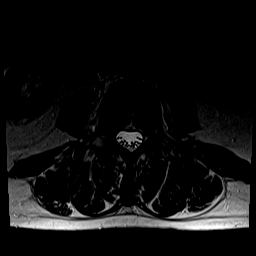
[im 22/39]
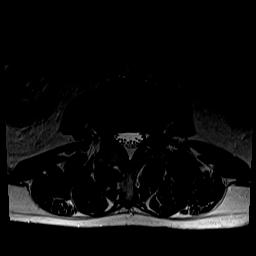
[im 28/39]
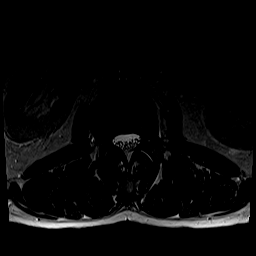
[im 33/39]
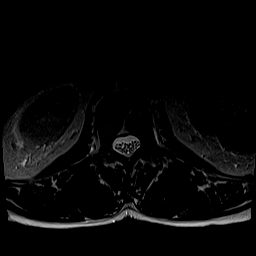
[im 39/39]
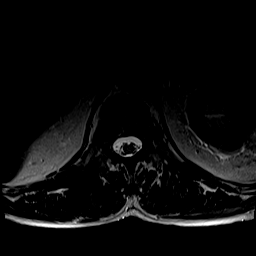

[Series 6: T1 · axial · 4.0mm · 0.39mm/px · z∈[-65,+130]mm · 5 of 39 slices shown (2 of 2)]
[im 1/39]
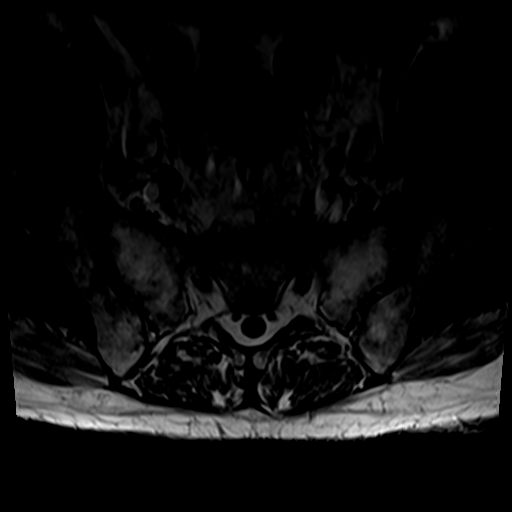
[im 6/39]
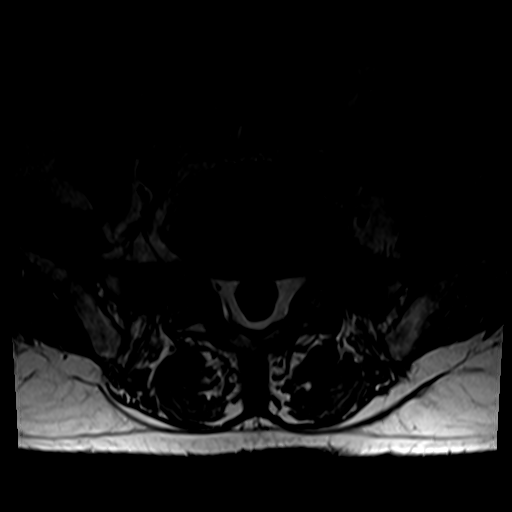
[im 11/39]
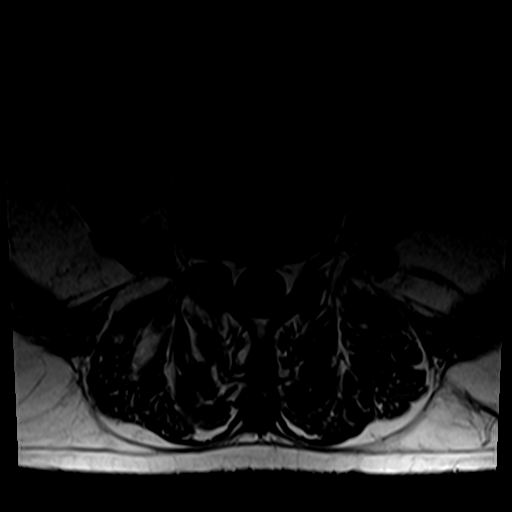
[im 20/39]
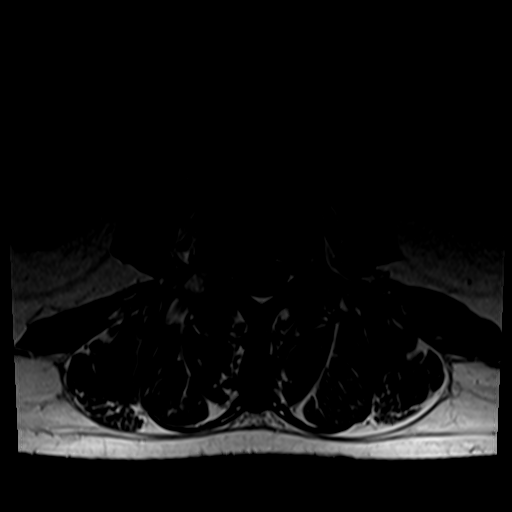
[im 33/39]
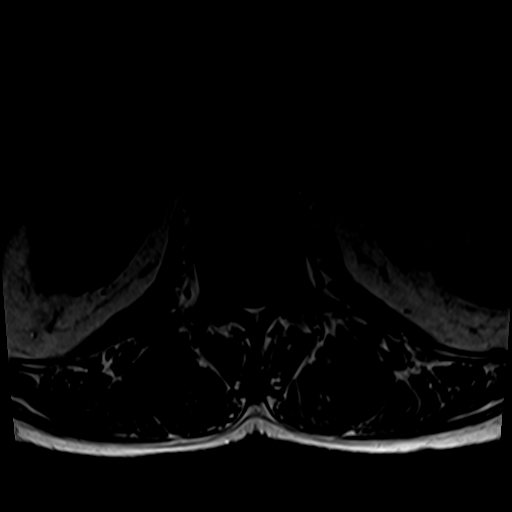

[26 of 48 positions shown; findings below may reference images not displayed]

FINDINGS: Segmentation:  5 lumbar type vertebrae

Alignment: Grade [DATE] anterolisthesis at L5-S1. Slight retrolisthesis
at L4-5.

Vertebrae: Chronic L5 pars defects. No acute fracture, discitis, or
aggressive bone lesion.

Conus medullaris and cauda equina: Conus extends to the L1 level.
Conus and cauda equina appear normal.

Paraspinal and other soft tissues: Negative for perispinal mass or
inflammation

Disc levels:

T12- L1: Small right foraminal protrusion at an annular fissure

L1-L2: Mild disc bulging and ventral spondylitic spurring

L2-L3: Foraminal predominant disc bulging with mild facet spurring

L3-L4: Disc bulging and left foraminal annular fissure. No neural
compression

L4-L5: Disc collapse with endplate and facet spurring eccentric to
the right. Moderate right foraminal stenosis.

L5-S1:Disc collapse and endplate degeneration with biforaminal
stenosis causing left more than right L5 root flattening.
IMPRESSION: 1. L5 chronic pars defects with L4-5 and L5-S1 listhesis and
accelerated disc degeneration.
2. L5-S1 left more than right foraminal impingement.
3. L4-5 moderate right foraminal narrowing.

## 2021-08-17 ENCOUNTER — Other Ambulatory Visit: Payer: Medicare Other

## 2021-08-23 NOTE — Progress Notes (Unsigned)
Edwin Brown [address] [place] Phone: [phone] Subjective:   Edwin Brown, am serving as a scribe for Dr. Hulan Saas.  I'm seeing this patient by the request  of:  [name], MD  CC: Back pain follow-up  EYC:[id number]  08/11/2021 Worsening pain with patient having worsening radicular symptoms.  Prednisone did help with the isolated back pain but did not help with any of the radicular symptoms.  Patient is still not working at the moment which activities continue to do get advanced imaging.  Need to rule out a compression fracture, occult fractures of the pelvis, or any herniated disc causing significant nerve irritation.  Toradol and Depo-Medrol given today.  Patient has pain medications as well if necessary.  We discussed if significant worsening pain to seek medical attention immediately.  Patient will follow-up with me again after imaging to discuss different treatment options including possible epidurals or if need for surgical intervention.  Update 08/24/2021 Edwin Brown is a 66 y.o. male coming in with complaint of lumbar spine pain.  Patient is having worsening pain with radicular symptoms and sent for an MRI.  Details of the MRI below. Patient.  Patient states wants to go over MRI. Wants recommendation for job limitations. Pain is doing much better.  MRI lumbar 08/15/2021 IMPRESSION: 1. L5 chronic pars defects with L4-5 and L5-S1 listhesis and accelerated disc degeneration. 2. L5-S1 left more than right foraminal impingement. 3. L4-5 moderate right foraminal narrowing.    MRI pelvis 08/15/2021 IMPRESSION: 1. Mild degenerative hip arthropathy bilaterally. Otherwise no significant findings in the pelvis to specifically cause the patient's hip pain.   Past Medical History:  Diagnosis Date   HTN (hypertension)    Syncope    Past Surgical History:  Procedure Laterality Date   APPENDECTOMY     CHOLECYSTECTOMY N/A  01/30/2014   Procedure: LAPAROSCOPIC CHOLECYSTECTOMY WITH INTRAOPERATIVE CHOLANGIOGRAM;  Surgeon: Pedro Earls, MD;  Location: [facility];  Service: General;  Laterality: N/A;   NASAL SINUS SURGERY     VASECTOMY     Social History   Socioeconomic History   Marital status: Married    Spouse name: Not on file   Number of children: 3   Years of education: Not on file   Highest education level: Not on file  Occupational History   Occupation: farrier  Tobacco Use   Smoking status: Never   Smokeless tobacco: Never  Substance and Sexual Activity   Alcohol use: Yes    Comment: ONCE A DAY    Drug use: No   Sexual activity: Not on file  Other Topics Concern   Not on file  Social History Narrative   Not on file   Social Determinants of Health   Financial Resource Strain: Not on file  Food Insecurity: Not on file  Transportation Needs: Not on file  Physical Activity: Not on file  Stress: Not on file  Social Connections: Not on file   Allergies  Allergen Reactions   Dilaudid [Hydromorphone Hcl] Shortness Of Breath   Phenergan [Promethazine Hcl] Other (See Comments)    Shaky, very out of sorts    Other     HORSE SERUM TETANUS ANTI TOX : PATIENT GETS HIGH FEVER   Family History  Problem Relation Age of Onset   Heart attack Father    Heart attack Brother     Current Outpatient Medications (Endocrine & Metabolic):    predniSONE (DELTASONE) 20 MG tablet,  Take 2 tablets (40 mg total) by mouth daily with breakfast.  Current Outpatient Medications (Cardiovascular):    losartan (COZAAR) 100 MG tablet, Take 100 mg by mouth daily.  Current Outpatient Medications (Respiratory):    albuterol (PROVENTIL HFA;VENTOLIN HFA) 108 (90 BASE) MCG/ACT inhaler, Inhale 1 puff into the lungs every 6 (six) hours as needed for wheezing or shortness of breath.   loratadine (CLARITIN) 10 MG tablet, Take 10 mg by mouth daily as needed for allergies.  Current Outpatient Medications (Analgesics):     traMADol (ULTRAM) 50 MG tablet, Take 50 mg by mouth every 4 (four) hours as needed for moderate pain.   Current Outpatient Medications (Other):    gabapentin (NEURONTIN) 100 MG capsule, Take 2 capsules (200 mg total) by mouth at bedtime as needed.   ALPRAZolam (XANAX) 0.25 MG tablet, Take 0.25 mg by mouth at bedtime as needed for anxiety.   AMBULATORY NON FORMULARY MEDICATION, Left wrist brace   clarithromycin (BIAXIN) 500 MG tablet, Take 500 mg by mouth 2 (two) times daily. For 10 days   Diclofenac Sodium 2 % SOLN, Apply 1 pump twice daily.   Glucos-MSM-C-Mn-Ginger-Willow (GLUCOSAMINE MSM COMPLEX PO), Take 2 capsules by mouth daily.    tiZANidine (ZANAFLEX) 4 MG tablet, Take 1 tablet (4 mg total) by mouth at bedtime.   Turmeric Curcumin 500 MG CAPS, Take 1,000 mg by mouth daily.     Review of Systems:  No headache, visual changes, nausea, vomiting, diarrhea, constipation, dizziness, abdominal pain, skin rash, fevers, chills, night sweats, weight loss, swollen lymph nodes, body aches, joint swelling, chest pain, shortness of breath, mood changes. POSITIVE muscle aches  Objective  Blood pressure (!) 142/86, pulse 95, height '5\' 8"'$  (1.727 m), weight 183 lb (83 kg), SpO2 96 %.   General: No apparent distress alert and oriented x3 mood and affect normal, dressed appropriately.  HEENT: Pupils equal, extraocular movements intact  Respiratory: Patient's speak in full sentences and does not appear short of breath  Cardiovascular: No lower extremity edema, non tender, no erythema  Back exam does have some loss lordosis.  No improvement overall with less significant on palpation.  Less radicular symptoms with straight leg test at the moment.  Seems to be neurovascular intact distally.    Impression and Recommendations:      The above documentation has been reviewed and is accurate and complete [name], DO

## 2021-08-24 ENCOUNTER — Ambulatory Visit: Payer: Medicare Other | Admitting: Family Medicine

## 2021-08-24 DIAGNOSIS — M545 Low back pain, unspecified: Secondary | ICD-10-CM | POA: Diagnosis not present

## 2021-08-24 MED ORDER — GABAPENTIN 100 MG PO CAPS: 200.0000 mg | ORAL_CAPSULE | Freq: Every evening | ORAL | 0 refills | Status: DC | PRN

## 2021-08-24 NOTE — Patient Instructions (Signed)
Gabapentin refilled. Take as needed at night See you again in 6 weeks

## 2021-08-24 NOTE — Assessment & Plan Note (Signed)
Patient does have severe arthritic changes with degenerative disc disease and some nerve impingement.  Discussed the possibility of epidurals the patient wants to hold on that he is doing better at the moment.  Discussed with patient that we need to continue to work on things should consider the possibility of increase activity slowly otherwise.  Follow-up with me again 6 to 8 weeks time discussed with patient as well as his wife on the phone and going over previous notes and before office visit 33 minutes.  Failed gabapentin as a potential treatment option

## 2021-09-14 DIAGNOSIS — J309 Allergic rhinitis, unspecified: Secondary | ICD-10-CM | POA: Diagnosis not present

## 2021-09-14 DIAGNOSIS — I10 Essential (primary) hypertension: Secondary | ICD-10-CM | POA: Diagnosis not present

## 2021-09-14 DIAGNOSIS — M1990 Unspecified osteoarthritis, unspecified site: Secondary | ICD-10-CM | POA: Diagnosis not present

## 2021-09-14 DIAGNOSIS — J45909 Unspecified asthma, uncomplicated: Secondary | ICD-10-CM | POA: Diagnosis not present

## 2021-09-14 DIAGNOSIS — Z0000 Encounter for general adult medical examination without abnormal findings: Secondary | ICD-10-CM | POA: Diagnosis not present

## 2021-09-14 DIAGNOSIS — E7800 Pure hypercholesterolemia, unspecified: Secondary | ICD-10-CM | POA: Diagnosis not present

## 2021-09-14 DIAGNOSIS — K219 Gastro-esophageal reflux disease without esophagitis: Secondary | ICD-10-CM | POA: Diagnosis not present

## 2021-10-04 NOTE — Progress Notes (Unsigned)
[place] Phone: [phone] Subjective:   [name], am serving as a scribe for Dr. Hulan Saas.   I'm seeing this patient by the request  of:  [name], MD  CC: Right shoulder pain and back pain follow-up  [id number]  08/24/2021 Patient does have severe arthritic changes with degenerative disc disease and some nerve impingement.  Discussed the possibility of epidurals the patient wants to hold on that he is doing better at the moment.  Discussed with patient that we need to continue to work on things should consider the possibility of increase activity slowly otherwise.  Follow-up with me again 6 to 8 weeks time discussed with patient as well as his wife on the phone and going over previous notes and before office visit 33 minutes.  Failed gabapentin as a potential treatment option  Update 10/05/2021 Edwin Brown is a 66 y.o. male coming in with complaint of LBP. Patient states that he has not been doing more than 20 hours a week. Back has been doing much better than last visit. Back has been achy at times.   L shoulder pain deep within the joint. Unable to sleep due to pain.        Past Medical History:  Diagnosis Date   HTN (hypertension)    Syncope    Past Surgical History:  Procedure Laterality Date   APPENDECTOMY     CHOLECYSTECTOMY N/A 01/30/2014   Procedure: LAPAROSCOPIC CHOLECYSTECTOMY WITH INTRAOPERATIVE CHOLANGIOGRAM;  Surgeon: Pedro Earls, MD;  Location: [facility];  Service: General;  Laterality: N/A;   NASAL SINUS SURGERY     VASECTOMY     Social History   Socioeconomic History   Marital status: Married    Spouse name: Not on file   Number of children: 3   Years of education: Not on file   Highest education level: Not on file  Occupational History   Occupation: farrier  Tobacco Use   Smoking status: Never   Smokeless tobacco: Never  Substance and Sexual Activity    Alcohol use: Yes    Comment: ONCE A DAY    Drug use: No   Sexual activity: Not on file  Other Topics Concern   Not on file  Social History Narrative   Not on file   Social Determinants of Health   Financial Resource Strain: Not on file  Food Insecurity: Not on file  Transportation Needs: Not on file  Physical Activity: Not on file  Stress: Not on file  Social Connections: Not on file   Allergies  Allergen Reactions   Dilaudid [Hydromorphone Hcl] Shortness Of Breath   Phenergan [Promethazine Hcl] Other (See Comments)    Shaky, very out of sorts    Other     HORSE SERUM TETANUS ANTI TOX : PATIENT GETS HIGH FEVER   Family History  Problem Relation Age of Onset   Heart attack Father    Heart attack Brother     Current Outpatient Medications (Endocrine & Metabolic):    predniSONE (DELTASONE) 20 MG tablet, Take 2 tablets (40 mg total) by mouth daily with breakfast.  Current Outpatient Medications (Cardiovascular):    losartan (COZAAR) 100 MG tablet, Take 100 mg by mouth daily.  Current Outpatient Medications (Respiratory):    albuterol (PROVENTIL HFA;VENTOLIN HFA) 108 (90 BASE) MCG/ACT inhaler, Inhale 1 puff into the lungs every 6 (six) hours as needed for wheezing or shortness of breath.  loratadine (CLARITIN) 10 MG tablet, Take 10 mg by mouth daily as needed for allergies.  Current Outpatient Medications (Analgesics):    traMADol (ULTRAM) 50 MG tablet, Take 50 mg by mouth every 4 (four) hours as needed for moderate pain.   Current Outpatient Medications (Other):    ALPRAZolam (XANAX) 0.25 MG tablet, Take 0.25 mg by mouth at bedtime as needed for anxiety.   AMBULATORY NON FORMULARY MEDICATION, Left wrist brace   clarithromycin (BIAXIN) 500 MG tablet, Take 500 mg by mouth 2 (two) times daily. For 10 days   Diclofenac Sodium 2 % SOLN, Apply 1 pump twice daily.   gabapentin (NEURONTIN) 100 MG capsule, Take 2 capsules (200 mg total) by mouth at bedtime as needed.    Glucos-MSM-C-Mn-Ginger-Willow (GLUCOSAMINE MSM COMPLEX PO), Take 2 capsules by mouth daily.    tiZANidine (ZANAFLEX) 4 MG tablet, Take 1 tablet (4 mg total) by mouth at bedtime.   Turmeric Curcumin 500 MG CAPS, Take 1,000 mg by mouth daily.   Reviewed prior external information including notes and imaging from  primary care provider As well as notes that were available from care everywhere and other healthcare systems.  Past medical history, social, surgical and family history all reviewed in electronic medical record.  No pertanent information unless stated regarding to the chief complaint.   Review of Systems:  No headache, visual changes, nausea, vomiting, diarrhea, constipation, dizziness, abdominal pain, skin rash, fevers, chills, night sweats, weight loss, swollen lymph nodes, body aches, joint swelling, chest pain, shortness of breath, mood changes. POSITIVE muscle aches  Objective  Blood pressure (!) 144/86, pulse 84, height '5\' 8"'$  (1.727 m), weight 182 lb (82.6 kg), SpO2 97 %.   General: No apparent distress alert and oriented x3 mood and affect normal, dressed appropriately.  HEENT: Pupils equal, extraocular movements intact  Respiratory: Patient's speak in full sentences and does not appear short of breath  Cardiovascular: No lower extremity edema, non tender, no erythema  Low back exam does have some loss lordosis.  Some tightness noted in the lower back does have tightness with FABER test bilaterally right greater than left.  Patient's neck exam does have some limited sidebending bilaterally.  Left shoulder exam shows positive impingement noted as well.  Patient has a positive crossover noted as well.  Rotator cuff strength does appear to be intact throughout.  Osteopathic findings C4 flexed rotated and side bent left C6 flexed rotated and side bent left T3 extended rotated and side bent left inhaled third rib T9 extended rotated and side bent right L1 flexed rotated and  side bent right Sacrum right on right  Procedure: Real-time Ultrasound Guided Injection of left subacromial space  Device: GE Logiq E  Ultrasound guided injection is preferred based studies that show increased duration, increased effect, greater accuracy, decreased procedural pain, increased response rate with ultrasound guided versus blind injection.  Verbal informed consent obtained.  Time-out conducted.  Noted no overlying erythema, induration, or other signs of local infection.  Skin prepped in a sterile fashion.  Local anesthesia: Topical Ethyl chloride.  With sterile technique and under real time ultrasound guidance:  Joint visualized.  21g 2 inch needle inserted lateral approach. Pictures taken for needle placement. Patient did have injection of 2 cc of 0.5% Marcaine, and 1cc of Kenalog 40 mg/dL. Completed without difficulty  Pain immediately resolved suggesting accurate placement of the medication.  Advised to call if fevers/chills, erythema, induration, drainage, or persistent bleeding.  Impression: Technically successful ultrasound guided injection.  Procedure: Real-time Ultrasound Guided Injection of left acromioclavicular joint Device: GE Logiq Q7 Ultrasound guided injection is preferred based studies that show increased duration, increased effect, greater accuracy, decreased procedural pain, increased response rate, and decreased cost with ultrasound guided versus blind injection.  Verbal informed consent obtained.  Time-out conducted.  Noted no overlying erythema, induration, or other signs of local infection.  Skin prepped in a sterile fashion.  Local anesthesia: Topical Ethyl chloride.  With sterile technique and under real time ultrasound guidance: With a 25-gauge half inch needle injecting 0.5 cc of 0.5% Marcaine and 0.5 cc of Kenalog 40 mg Completed without difficulty  Pain immediately resolved suggesting accurate placement of the medication.  Advised to call if  fevers/chills, erythema, induration, drainage, or persistent bleeding.  Impression: Technically successful ultrasound guided injection.    Impression and Recommendations:     The above documentation has been reviewed and is accurate and complete [name], DO

## 2021-10-05 ENCOUNTER — Ambulatory Visit (INDEPENDENT_AMBULATORY_CARE_PROVIDER_SITE_OTHER): Payer: Medicare Other | Admitting: Family Medicine

## 2021-10-05 ENCOUNTER — Ambulatory Visit: Payer: Self-pay

## 2021-10-05 VITALS — BP 144/86 | HR 84 | Ht 68.0 in | Wt 182.0 lb

## 2021-10-05 DIAGNOSIS — G8929 Other chronic pain: Secondary | ICD-10-CM | POA: Diagnosis not present

## 2021-10-05 DIAGNOSIS — M25512 Pain in left shoulder: Secondary | ICD-10-CM

## 2021-10-05 DIAGNOSIS — M999 Biomechanical lesion, unspecified: Secondary | ICD-10-CM | POA: Diagnosis not present

## 2021-10-05 DIAGNOSIS — M545 Low back pain, unspecified: Secondary | ICD-10-CM | POA: Diagnosis not present

## 2021-10-05 DIAGNOSIS — M9903 Segmental and somatic dysfunction of lumbar region: Secondary | ICD-10-CM | POA: Diagnosis not present

## 2021-10-05 DIAGNOSIS — M9908 Segmental and somatic dysfunction of rib cage: Secondary | ICD-10-CM | POA: Diagnosis not present

## 2021-10-05 DIAGNOSIS — M9901 Segmental and somatic dysfunction of cervical region: Secondary | ICD-10-CM | POA: Diagnosis not present

## 2021-10-05 DIAGNOSIS — M19012 Primary osteoarthritis, left shoulder: Secondary | ICD-10-CM

## 2021-10-05 DIAGNOSIS — M9902 Segmental and somatic dysfunction of thoracic region: Secondary | ICD-10-CM | POA: Diagnosis not present

## 2021-10-05 DIAGNOSIS — M7552 Bursitis of left shoulder: Secondary | ICD-10-CM | POA: Diagnosis not present

## 2021-10-05 DIAGNOSIS — M9904 Segmental and somatic dysfunction of sacral region: Secondary | ICD-10-CM | POA: Diagnosis not present

## 2021-10-05 NOTE — Assessment & Plan Note (Signed)

## 2021-10-05 NOTE — Assessment & Plan Note (Signed)
Left sided noted oa  Discussed home exercises and avoiding certain range of motion.  Does have the muscle relaxant as needed.  Has had tramadol as well.  Follow-up again in 6 to 8 weeks

## 2021-10-05 NOTE — Patient Instructions (Addendum)
Injected shoulder and AC joint today Manipulated back See me again in 7-8 weeks

## 2021-10-05 NOTE — Assessment & Plan Note (Signed)
Responded with improvement with range of motion almost immediately.  No radicular symptoms of the neck noted today.  Discussed icing regimen and home exercises.  Discussed with her considering which ones to avoid.  Follow-up again in 6 to 8 weeks

## 2021-10-05 NOTE — Assessment & Plan Note (Signed)
Improved with the acute pain but continues to have some tenderness.  Still working approximately 20 to 25 hours a week.  Discussed continuing the exercises.  Increase activities as tolerated.  Follow-up again in 6 to 8 weeks

## 2021-11-15 DIAGNOSIS — D225 Melanocytic nevi of trunk: Secondary | ICD-10-CM | POA: Diagnosis not present

## 2021-11-15 DIAGNOSIS — L821 Other seborrheic keratosis: Secondary | ICD-10-CM | POA: Diagnosis not present

## 2021-11-15 DIAGNOSIS — L573 Poikiloderma of Civatte: Secondary | ICD-10-CM | POA: Diagnosis not present

## 2021-11-15 DIAGNOSIS — C44629 Squamous cell carcinoma of skin of left upper limb, including shoulder: Secondary | ICD-10-CM | POA: Diagnosis not present

## 2021-11-15 DIAGNOSIS — L814 Other melanin hyperpigmentation: Secondary | ICD-10-CM | POA: Diagnosis not present

## 2021-11-15 DIAGNOSIS — L570 Actinic keratosis: Secondary | ICD-10-CM | POA: Diagnosis not present

## 2021-11-29 NOTE — Progress Notes (Unsigned)
[place] Phone: [phone] Subjective:   Edwin [name], am serving as a scribe for Dr. Hulan Saas.  I'm seeing this patient by the request  of:  [name], MD  CC: Neck and back pain follow-up  [id number]  Edwin [name] is a 66 y.o. male coming in with complaint of back and neck pain. OMT 10/05/2021. Patient states that he has been doing well. Sometimes wakes up in pain but has had more better days.   Medications patient has been prescribed: Gabapentin, Zanaflex  Taking:         Reviewed prior external information including notes and imaging from previsou exam, outside providers and external EMR if available.   As well as notes that were available from care everywhere and other healthcare systems.  Past medical history, social, surgical and family history all reviewed in electronic medical record.  [name] pertanent information unless stated regarding to the chief complaint.   Past Medical History:  Diagnosis Date   HTN (hypertension)    Syncope     Allergies  Allergen Reactions   Dilaudid [Hydromorphone Hcl] Shortness Of Breath   Phenergan [Promethazine Hcl] Other (See Comments)    Shaky, very out of sorts    Other     HORSE SERUM TETANUS ANTI TOX : PATIENT GETS HIGH FEVER     Review of Systems:  [name] headache, visual changes, nausea, vomiting, diarrhea, constipation, dizziness, abdominal pain, skin rash, fevers, chills, night sweats, weight loss, swollen lymph nodes, body aches, joint swelling, chest pain, shortness of breath, mood changes. POSITIVE muscle aches  Objective  Blood pressure (!) 144/88, pulse 97, height '5\' 8"'$  (1.727 m), weight 184 lb (83.5 kg), SpO2 98 %.   General: [name] apparent distress alert and oriented x3 mood and affect normal, dressed appropriately.  HEENT: Pupils equal, extraocular movements intact  Respiratory: Patient's speak in full sentences and does not  appear short of breath  Cardiovascular: [name] lower extremity edema, non tender, [name] erythema  Gait MSK:  Back does have loss of lordosis.  Patient does have some very mild tightness noted of the hip flexors right greater than left.  Tightness with FABER test noted.  Negative straight leg test.  Patient does have some mild tremor noted of the right extremity.  [name] cogwheeling noted.  Seems to be more at rest than with motion.  Osteopathic findings  C2 flexed rotated and side bent right T3 extended rotated and side bent right inhaled rib T6 extended rotated and side bent left L2 flexed rotated and side bent right Sacrum right on right       Assessment and Plan:  Tremor of right hand Likely hereditary but questionable Parkinson's in grandfather.  At the moment [name] significant cogwheeling, [name] masked facies.  Should do relatively well but will send to neurology to further evaluate.  Acute low back pain Improvement noted with conservative therapy.  Patient also not working quite as much which I think has been more beneficial.  Discussed with patient about icing regimen and home exercises, increase activity slowly.  Follow-up again in 6 weeks    Nonallopathic problems  Decision today to treat with OMT was based on Physical Exam  After verbal consent patient was treated with HVLA, ME, FPR techniques in cervical, rib, thoracic, lumbar, and sacral  areas  Patient tolerated the procedure well with improvement in symptoms  Patient given exercises, stretches and lifestyle modifications  See medications  in patient instructions if given  Patient will follow up in 4-8 weeks     The above documentation has been reviewed and is accurate and complete [name], DO         Note: This dictation was prepared with Dragon dictation along with smaller phrase technology. Any transcriptional errors that result from this process are unintentional.

## 2021-11-30 ENCOUNTER — Ambulatory Visit: Payer: Medicare Other | Admitting: Family Medicine

## 2021-11-30 ENCOUNTER — Encounter: Payer: Self-pay | Admitting: Family Medicine

## 2021-11-30 VITALS — BP 144/88 | HR 97 | Ht 68.0 in | Wt 184.0 lb

## 2021-11-30 DIAGNOSIS — M9902 Segmental and somatic dysfunction of thoracic region: Secondary | ICD-10-CM | POA: Diagnosis not present

## 2021-11-30 DIAGNOSIS — M9908 Segmental and somatic dysfunction of rib cage: Secondary | ICD-10-CM

## 2021-11-30 DIAGNOSIS — M9904 Segmental and somatic dysfunction of sacral region: Secondary | ICD-10-CM | POA: Diagnosis not present

## 2021-11-30 DIAGNOSIS — M545 Low back pain, unspecified: Secondary | ICD-10-CM | POA: Diagnosis not present

## 2021-11-30 DIAGNOSIS — R251 Tremor, unspecified: Secondary | ICD-10-CM | POA: Insufficient documentation

## 2021-11-30 DIAGNOSIS — M9901 Segmental and somatic dysfunction of cervical region: Secondary | ICD-10-CM

## 2021-11-30 DIAGNOSIS — M9903 Segmental and somatic dysfunction of lumbar region: Secondary | ICD-10-CM

## 2021-11-30 NOTE — Patient Instructions (Signed)
Good to see you Neurology will call you See me in 2 months

## 2021-11-30 NOTE — Assessment & Plan Note (Signed)
Improvement noted with conservative therapy.  Patient also not working quite as much which I think has been more beneficial.  Discussed with patient about icing regimen and home exercises, increase activity slowly.  Follow-up again in 6 weeks

## 2021-11-30 NOTE — Assessment & Plan Note (Signed)
Likely hereditary but questionable Parkinson's in grandfather.  At the moment no significant cogwheeling, no masked facies.  Should do relatively well but will send to neurology to further evaluate.

## 2022-01-30 NOTE — Progress Notes (Unsigned)
[place] Phone: [phone] Subjective:   [name], am serving as a scribe for Dr. Hulan Saas.  I'm seeing this patient by the request  of:  [name], MD  CC: Worsening back pain  [id number]  Edwin Brown is a 66 y.o. male coming in with complaint of back and neck pain. OMT 11/30/2021. Patient states that his back has not been bothering him. Does c/o achiness in both legs and in his back. Notes being very fatigued lately.   L shoulder is starting to bother him but he is unsure if he wants injection yet today.    Medications patient has been prescribed: None  Taking:         Reviewed prior external information including notes and imaging from previsou exam, outside providers and external EMR if available.   As well as notes that were available from care everywhere and other healthcare systems.  Past medical history, social, surgical and family history all reviewed in electronic medical record.  No pertanent information unless stated regarding to the chief complaint.   Past Medical History:  Diagnosis Date   HTN (hypertension)    Syncope     Allergies  Allergen Reactions   Dilaudid [Hydromorphone Hcl] Shortness Of Breath   Phenergan [Promethazine Hcl] Other (See Comments)    Shaky, very out of sorts    Other     HORSE SERUM TETANUS ANTI TOX : PATIENT GETS HIGH FEVER     Review of Systems:  No headache, visual changes, nausea, vomiting, diarrhea, constipation, dizziness, abdominal pain, skin rash, fevers, chills, night sweats, weight loss, swollen lymph nodes, body aches, joint swelling, chest pain, shortness of breath, mood changes. POSITIVE muscle aches  Objective  Blood pressure (!) 132/92, pulse 91, height '5\' 8"'$  (1.727 m), weight 192 lb (87.1 kg), SpO2 96 %.   General: No apparent distress alert and oriented x3 mood and affect normal, dressed appropriately.  HEENT:  Pupils equal, extraocular movements intact  Respiratory: Patient's speak in full sentences and does not appear short of breath  Cardiovascular: No lower extremity edema, non tender, no erythema  Low back has significant tightness noted as well.  Patient has limited extension of only 2 degrees.  Patient also has tenderness to palpation diffusely.  Patient has significant tightness of the straight leg test.  Seems to be very uncomfortable.  Osteopathic findings  C2 flexed rotated and side bent right C6 flexed rotated and side bent left T3 extended rotated and side bent right inhaled rib T9 extended rotated and side bent left        Assessment and Plan:  Acute low back pain Acute worsening of low back pain again.  Patient feels like he is making not a lot of improvement at the moment.  Patient is also having increasing in fatigue.  Will get laboratory workup to see if anything else is contributing.  Discussed with patient about icing regimen and home exercises, discussed which activities to do and which ones to avoid.  Increase activity slowly otherwise.  We discussed with patient having the pars defect in the anterior listhesis after reviewing the MRI we should consider the possibility an epidural.  Follow-up with me again in 4 to 6 weeks.  Also increase gabapentin to 200 mg twice daily if needed or patient can titrate at night up to 3 mg.    Nonallopathic problems  Decision today to treat with OMT was  based on Physical Exam  After verbal consent patient was treated with HVLA, ME, FPR techniques in cervical, rib, thoracic,   areas unable to do OMT on the lumbar due to tightness   Patient tolerated the procedure well with improvement in symptoms  Patient given exercises, stretches and lifestyle modifications  See medications in patient instructions if given  Patient will follow up in 4-8 weeks    The above documentation has been reviewed and is accurate and complete [name], DO          Note: This dictation was prepared with Dragon dictation along with smaller phrase technology. Any transcriptional errors that result from this process are unintentional.

## 2022-02-01 ENCOUNTER — Ambulatory Visit: Payer: Medicare Other | Admitting: Family Medicine

## 2022-02-01 VITALS — BP 132/92 | HR 91 | Ht 68.0 in | Wt 192.0 lb

## 2022-02-01 DIAGNOSIS — M9908 Segmental and somatic dysfunction of rib cage: Secondary | ICD-10-CM

## 2022-02-01 DIAGNOSIS — M2550 Pain in unspecified joint: Secondary | ICD-10-CM

## 2022-02-01 DIAGNOSIS — M545 Low back pain, unspecified: Secondary | ICD-10-CM

## 2022-02-01 DIAGNOSIS — M9901 Segmental and somatic dysfunction of cervical region: Secondary | ICD-10-CM | POA: Diagnosis not present

## 2022-02-01 DIAGNOSIS — M9902 Segmental and somatic dysfunction of thoracic region: Secondary | ICD-10-CM

## 2022-02-01 LAB — CBC WITH DIFFERENTIAL/PLATELET
Basophils Absolute: 0.1 10*3/uL (ref 0.0–0.1)
Basophils Relative: 1.2 % (ref 0.0–3.0)
Eosinophils Absolute: 0.2 10*3/uL (ref 0.0–0.7)
Eosinophils Relative: 4.6 % (ref 0.0–5.0)
HCT: 43.6 % (ref 39.0–52.0)
Hemoglobin: 14.8 g/dL (ref 13.0–17.0)
Lymphocytes Relative: 32.1 % (ref 12.0–46.0)
Lymphs Abs: 1.6 10*3/uL (ref 0.7–4.0)
MCHC: 33.9 g/dL (ref 30.0–36.0)
MCV: 89 fl (ref 78.0–100.0)
Monocytes Absolute: 0.5 10*3/uL (ref 0.1–1.0)
Monocytes Relative: 10 % (ref 3.0–12.0)
Neutro Abs: 2.6 10*3/uL (ref 1.4–7.7)
Neutrophils Relative %: 52.1 % (ref 43.0–77.0)
Platelets: 267 10*3/uL (ref 150.0–400.0)
RBC: 4.9 Mil/uL (ref 4.22–5.81)
RDW: 13.4 % (ref 11.5–15.5)
WBC: 5 10*3/uL (ref 4.0–10.5)

## 2022-02-01 LAB — COMPREHENSIVE METABOLIC PANEL
ALT: 17 U/L (ref 0–53)
AST: 19 U/L (ref 0–37)
Albumin: 4.5 g/dL (ref 3.5–5.2)
Alkaline Phosphatase: 55 U/L (ref 39–117)
BUN: 22 mg/dL (ref 6–23)
CO2: 33 mEq/L — ABNORMAL HIGH (ref 19–32)
Calcium: 9.9 mg/dL (ref 8.4–10.5)
Chloride: 100 mEq/L (ref 96–112)
Creatinine, Ser: 0.8 mg/dL (ref 0.40–1.50)
GFR: 91.4 mL/min (ref >60.00)
Glucose, Bld: 96 mg/dL (ref 70–99)
Potassium: 4.8 mEq/L (ref 3.5–5.1)
Sodium: 136 mEq/L (ref 135–145)
Total Bilirubin: 0.4 mg/dL (ref 0.2–1.2)
Total Protein: 7 g/dL (ref 6.0–8.3)

## 2022-02-01 LAB — TSH: TSH: 1.12 u[IU]/mL (ref 0.35–5.50)

## 2022-02-01 LAB — VITAMIN B12: Vitamin B-12: 260 pg/mL (ref 211–911)

## 2022-02-01 LAB — SEDIMENTATION RATE: Sed Rate: 4 mm/hr (ref 0–20)

## 2022-02-01 MED ORDER — GABAPENTIN 100 MG PO CAPS: 200.0000 mg | ORAL_CAPSULE | Freq: Two times a day (BID) | ORAL | 1 refills | Status: DC

## 2022-02-01 MED ORDER — METHYLPREDNISOLONE ACETATE 80 MG/ML IJ SUSP
80.0000 mg | Freq: Once | INTRAMUSCULAR | Status: AC
  Administered 2022-02-01: 80 mg via INTRAMUSCULAR

## 2022-02-01 MED ORDER — GABAPENTIN 100 MG PO CAPS: 200.0000 mg | ORAL_CAPSULE | Freq: Every day | ORAL | 1 refills | Status: DC

## 2022-02-01 MED ORDER — KETOROLAC TROMETHAMINE 60 MG/2ML IM SOLN
60.0000 mg | Freq: Once | INTRAMUSCULAR | Status: AC
  Administered 2022-02-01: 60 mg via INTRAMUSCULAR

## 2022-02-01 NOTE — Addendum Note (Signed)
Addended by: Douglass Rivers T on: 02/01/2022 11:38 AM   Modules accepted: Orders

## 2022-02-01 NOTE — Assessment & Plan Note (Addendum)
Acute worsening of low back pain again.  Patient feels like he is making not a lot of improvement at the moment.  Patient is also having increasing in fatigue.  Will get laboratory workup to see if anything else is contributing.  Discussed with patient about icing regimen and home exercises, discussed which activities to do and which ones to avoid.  Increase activity slowly otherwise.  We discussed with patient having the pars defect in the anterior listhesis after reviewing the MRI we should consider the possibility an epidural.  Follow-up with me again in 4 to 6 weeks.  Also increase gabapentin to 200 mg twice daily if needed or patient can titrate at night up to 300 mg.  Also due to the severity of the acute pain Toradol and Depo-Medrol given today IM.

## 2022-02-01 NOTE — Patient Instructions (Addendum)
Injections in backside Gabapentin '200mg'$  2x a day-take up to 3 a night:  Labs today See me again in 5-6 weeks

## 2022-02-06 DIAGNOSIS — H2513 Age-related nuclear cataract, bilateral: Secondary | ICD-10-CM | POA: Diagnosis not present

## 2022-02-06 DIAGNOSIS — H35033 Hypertensive retinopathy, bilateral: Secondary | ICD-10-CM | POA: Diagnosis not present

## 2022-02-06 DIAGNOSIS — Z83511 Family history of glaucoma: Secondary | ICD-10-CM | POA: Diagnosis not present

## 2022-02-06 DIAGNOSIS — H11153 Pinguecula, bilateral: Secondary | ICD-10-CM | POA: Diagnosis not present

## 2022-02-07 DIAGNOSIS — J310 Chronic rhinitis: Secondary | ICD-10-CM | POA: Diagnosis not present

## 2022-02-07 DIAGNOSIS — H903 Sensorineural hearing loss, bilateral: Secondary | ICD-10-CM | POA: Diagnosis not present

## 2022-02-07 DIAGNOSIS — R0683 Snoring: Secondary | ICD-10-CM | POA: Diagnosis not present

## 2022-02-07 DIAGNOSIS — H90A32 Mixed conductive and sensorineural hearing loss, unilateral, left ear with restricted hearing on the contralateral side: Secondary | ICD-10-CM | POA: Diagnosis not present

## 2022-02-07 DIAGNOSIS — H6993 Unspecified Eustachian tube disorder, bilateral: Secondary | ICD-10-CM | POA: Diagnosis not present

## 2022-02-10 ENCOUNTER — Encounter: Payer: Self-pay | Admitting: Family Medicine

## 2022-03-02 ENCOUNTER — Other Ambulatory Visit: Payer: Self-pay | Admitting: Family Medicine

## 2022-03-03 ENCOUNTER — Other Ambulatory Visit: Payer: Self-pay

## 2022-03-03 ENCOUNTER — Other Ambulatory Visit: Payer: Self-pay | Admitting: Family Medicine

## 2022-03-03 MED ORDER — TIZANIDINE HCL 4 MG PO TABS: 4.0000 mg | ORAL_TABLET | Freq: Every day | ORAL | 0 refills | Status: DC

## 2022-03-03 NOTE — Telephone Encounter (Signed)
Refilled tizanidine.   ?

## 2022-03-08 NOTE — Progress Notes (Signed)
[place] Phone: [phone] Subjective:   [name], am serving as a scribe for Dr. Hulan Saas.  I'm seeing this patient by the request  of:  Edwin Pillar, MD  CC: Low back pain  Edwin Brown  Edwin Brown is a 66 y.o. male coming in with complaint of back and neck pain. OMT 02/01/2022.  Patient at last exam was having a significant exacerbation with increasing fatigue as well.  Patient did have the MRI of the lumbar spine that did show the pars defect and we discussed the epidural as well as increasing gabapentin to 200 mg twice daily.  Patient states that he started taking B12 which helped increase his energy. Feels like his back pain has returned and is unsure what might have irritated it. Pain in L scapula and both sides of lumbar spine. Has been trying to roll and stretching. Using zanaflex and gabapentin. Has been using '50mg'$  of prednisone for 5 days and using half of a pill for past 3 days.   Medications patient has been prescribed: Zanaflex, Prednisone, gabapentin  Taking: Yes intermittently         Reviewed prior external information including notes and imaging from previsou exam, outside providers and external EMR if available.  Previous laboratory workup did show low B12 otherwise laboratory workup was unremarkable.  As well as notes that were available from care everywhere and other healthcare systems.  Past medical history, social, surgical and family history all reviewed in electronic medical record.  No pertanent information unless stated regarding to the chief complaint.   Past Medical History:  Diagnosis Date   HTN (hypertension)    Syncope     Allergies  Allergen Reactions   Dilaudid [Hydromorphone Hcl] Shortness Of Breath   Phenergan [Promethazine Hcl] Other (See Comments)    Shaky, very out of sorts    Other     HORSE SERUM TETANUS ANTI TOX : PATIENT GETS HIGH FEVER      Review of Systems:  No headache, visual changes, nausea, vomiting, diarrhea, constipation, dizziness, abdominal pain, skin rash, fevers, chills, night sweats, weight loss, swollen lymph nodes, , joint swelling, chest pain, shortness of breath, mood changes. POSITIVE muscle aches, body aches  Objective  Blood pressure (!) 148/90, pulse 96, height '5\' 8"'$  (1.727 m), weight 190 lb (86.2 kg), SpO2 98 %.   General: No apparent distress alert and oriented x3 mood and affect normal, dressed appropriately.  HEENT: Pupils equal, extraocular movements intact  Respiratory: Patient's speak in full sentences and does not appear short of breath  Cardiovascular: No lower extremity edema, non tender, no erythema  Low back exam does have significant loss of lordosis.  Worsening pain with anything in flexion greater than 10 degrees or extension greater than 10 degrees.  Positive FABER test bilaterally.  Osteopathic findings  C2 flexed rotated and side bent right C7 flexed rotated and side bent left T3 extended rotated and side bent right inhaled rib T8 extended rotated and side bent left L2 flexed rotated and side bent right Sacrum right on right       Assessment and Plan:  Acute low back pain Acute worsening low back pain.  Has had spondylolisthesis previously.  We do have some signs of some nerve root and nerve irritation.  Patient has been recently on prednisone and still not making significant improvement.  Given Toradol and Depo-Medrol injections today.  Discussed with patient that also  should consider the possibility of an epidural based on MRI results and see if this will be beneficial.  Patient will start to decrease is much working that he is doing and follow-up with me again in 2 to 3 weeks    Nonallopathic problems  Decision today to treat with OMT was based on Physical Exam  After verbal consent patient was treated with HVLA, ME, FPR techniques in cervical, rib, thoracic, lumbar,  and sacral  areas  Patient tolerated the procedure well with improvement in symptoms  Patient given exercises, stretches and lifestyle modifications  See medications in patient instructions if given  Patient will follow up in 4-8 weeks     The above documentation has been reviewed and is accurate and complete [name], DO         Note: This dictation was prepared with Dragon dictation along with smaller phrase technology. Any transcriptional errors that result from this process are unintentional.

## 2022-03-10 ENCOUNTER — Encounter: Payer: Self-pay | Admitting: Family Medicine

## 2022-03-10 ENCOUNTER — Ambulatory Visit: Payer: Medicare Other | Admitting: Family Medicine

## 2022-03-10 VITALS — BP 148/90 | HR 96 | Ht 68.0 in | Wt 190.0 lb

## 2022-03-10 DIAGNOSIS — M545 Low back pain, unspecified: Secondary | ICD-10-CM

## 2022-03-10 DIAGNOSIS — M9908 Segmental and somatic dysfunction of rib cage: Secondary | ICD-10-CM | POA: Diagnosis not present

## 2022-03-10 DIAGNOSIS — M9904 Segmental and somatic dysfunction of sacral region: Secondary | ICD-10-CM | POA: Diagnosis not present

## 2022-03-10 DIAGNOSIS — M9901 Segmental and somatic dysfunction of cervical region: Secondary | ICD-10-CM | POA: Diagnosis not present

## 2022-03-10 DIAGNOSIS — M9902 Segmental and somatic dysfunction of thoracic region: Secondary | ICD-10-CM | POA: Diagnosis not present

## 2022-03-10 DIAGNOSIS — M9903 Segmental and somatic dysfunction of lumbar region: Secondary | ICD-10-CM

## 2022-03-10 MED ORDER — KETOROLAC TROMETHAMINE 60 MG/2ML IM SOLN
60.0000 mg | Freq: Once | INTRAMUSCULAR | Status: AC
  Administered 2022-03-10: 60 mg via INTRAMUSCULAR

## 2022-03-10 MED ORDER — METHYLPREDNISOLONE ACETATE 80 MG/ML IJ SUSP
80.0000 mg | Freq: Once | INTRAMUSCULAR | Status: AC
  Administered 2022-03-10: 80 mg via INTRAMUSCULAR

## 2022-03-10 NOTE — Patient Instructions (Addendum)
Good to see you Injections in backside Finish antiinflammatories Write me on Tuesday if not better See me in 4 weeks

## 2022-03-11 NOTE — Assessment & Plan Note (Signed)
Acute worsening low back pain.  Has had spondylolisthesis previously.  We do have some signs of some nerve root and nerve irritation.  Patient has been recently on prednisone and still not making significant improvement.  Given Toradol and Depo-Medrol injections today.  Discussed with patient that also should consider the possibility of an epidural based on MRI results and see if this will be beneficial.  Patient will start to decrease is much working that he is doing and follow-up with me again in 2 to 3 weeks

## 2022-03-12 ENCOUNTER — Encounter: Payer: Self-pay | Admitting: Family Medicine

## 2022-03-13 ENCOUNTER — Encounter: Payer: Self-pay | Admitting: Family Medicine

## 2022-03-14 ENCOUNTER — Other Ambulatory Visit: Payer: Self-pay

## 2022-03-14 DIAGNOSIS — M5416 Radiculopathy, lumbar region: Secondary | ICD-10-CM

## 2022-03-14 NOTE — Telephone Encounter (Signed)
Pt called for an appt. Back is not better, pt agrees to epidural, please order.

## 2022-03-16 NOTE — Discharge Instructions (Signed)

## 2022-03-17 ENCOUNTER — Ambulatory Visit
Admission: RE | Admit: 2022-03-17 | Discharge: 2022-03-17 | Disposition: A | Discharge status: Home / Self Care | Payer: Medicare Other | Source: Ambulatory Visit | Attending: Family Medicine | Admitting: Family Medicine

## 2022-03-17 DIAGNOSIS — M5416 Radiculopathy, lumbar region: Secondary | ICD-10-CM

## 2022-03-17 DIAGNOSIS — M47817 Spondylosis without myelopathy or radiculopathy, lumbosacral region: Secondary | ICD-10-CM | POA: Diagnosis not present

## 2022-03-17 MED ORDER — METHYLPREDNISOLONE ACETATE 40 MG/ML INJ SUSP (RADIOLOG
80.0000 mg | Freq: Once | INTRAMUSCULAR | Status: AC
  Administered 2022-03-17: 80 mg via EPIDURAL

## 2022-03-17 MED ORDER — IOPAMIDOL (ISOVUE-M 200) INJECTION 41%
1.0000 mL | Freq: Once | INTRAMUSCULAR | Status: AC
  Administered 2022-03-17: 1 mL via EPIDURAL

## 2022-03-18 ENCOUNTER — Encounter: Payer: Self-pay | Admitting: Family Medicine

## 2022-03-22 ENCOUNTER — Encounter: Payer: Self-pay | Admitting: Family Medicine

## 2022-03-22 ENCOUNTER — Other Ambulatory Visit: Payer: Self-pay

## 2022-03-22 DIAGNOSIS — M5416 Radiculopathy, lumbar region: Secondary | ICD-10-CM

## 2022-03-31 ENCOUNTER — Ambulatory Visit
Admission: RE | Admit: 2022-03-31 | Discharge: 2022-03-31 | Disposition: A | Discharge status: Home / Self Care | Payer: Medicare Other | Source: Ambulatory Visit | Attending: Family Medicine | Admitting: Family Medicine

## 2022-03-31 DIAGNOSIS — M5416 Radiculopathy, lumbar region: Secondary | ICD-10-CM | POA: Diagnosis not present

## 2022-03-31 MED ORDER — IOPAMIDOL (ISOVUE-M 200) INJECTION 41%
1.0000 mL | Freq: Once | INTRAMUSCULAR | Status: AC
  Administered 2022-03-31: 1 mL via EPIDURAL

## 2022-03-31 MED ORDER — METHYLPREDNISOLONE ACETATE 40 MG/ML INJ SUSP (RADIOLOG
80.0000 mg | Freq: Once | INTRAMUSCULAR | Status: AC
  Administered 2022-03-31: 80 mg via EPIDURAL

## 2022-03-31 NOTE — Discharge Instructions (Signed)

## 2022-04-04 ENCOUNTER — Other Ambulatory Visit: Payer: Self-pay

## 2022-04-04 ENCOUNTER — Encounter: Payer: Self-pay | Admitting: Family Medicine

## 2022-04-04 DIAGNOSIS — M5416 Radiculopathy, lumbar region: Secondary | ICD-10-CM

## 2022-04-06 NOTE — Progress Notes (Signed)
[facility] Synce[facility] [address] [place] Phone: [phone] Subjective:   IVilma Meckel, am serving as a scribe for Dr. Hulan Saas.  I'm seeing this patient by the request  of:  [name], MD  CC: Back and neck pain follow-up  JIR:[id number]  Edwin Brown is a 67 y.o. male coming in with complaint of back and neck pain. OMT on 03/10/2022. Patient states doing well since last epidural. Took a couple of days to work, but has helped with the pain immensely. Has another one scheduled, but overall doing well.  Medications patient has been prescribed: Zanaflex  Taking:         Reviewed prior external information including notes and imaging from previsou exam, outside providers and external EMR if available.   As well as notes that were available from care everywhere and other healthcare systems.  Past medical history, social, surgical and family history all reviewed in electronic medical record.  No pertanent information unless stated regarding to the chief complaint.   Past Medical History:  Diagnosis Date   HTN (hypertension)    Syncope     Allergies  Allergen Reactions   Dilaudid [Hydromorphone Hcl] Shortness Of Breath   Phenergan [Promethazine Hcl] Other (See Comments)    Shaky, very out of sorts    Other     HORSE SERUM TETANUS ANTI TOX : PATIENT GETS HIGH FEVER     Review of Systems:  No headache, visual changes, nausea, vomiting, diarrhea, constipation, dizziness, abdominal pain, skin rash, fevers, chills, night sweats, weight loss, swollen lymph nodes, body aches, joint swelling, chest pain, shortness of breath, mood changes. POSITIVE muscle aches  Objective  Blood pressure 130/88, pulse 93, height '5\' 8"'$  (1.727 m), weight 186 lb (84.4 kg), SpO2 97 %.   General: No apparent distress alert and oriented x3 mood and affect normal, dressed appropriately.  HEENT: Pupils equal, extraocular movements intact   Respiratory: Patient's speak in full sentences and does not appear short of breath  Cardiovascular: No lower extremity edema, non tender, no erythema  Neck exam does have some loss of lordosis.  Some limited motion of the lower back.  Patient Thoracolumbar junction noted.  Tightness with FABER test Tightness with straight leg test bilaterally.  Osteopathic findings  C3 flexed rotated and side bent right C6 flexed rotated and side bent left T3 extended rotated and side bent right inhaled rib T8 extended rotated and side bent left L1 flexed rotated and side bent right Sacrum right on right    Assessment and Plan:  Acute low back pain Patient is doing relatively well overall.  Is doing better with some range of motion.  Attempted osteopathic manipulation.  Patient would like to go through with the epidural to see if he can get even more improvement that is scheduled for February 5.  Discussed icing regimen and home exercises.  Follow-up again in 6 weeks    Nonallopathic problems  Decision today to treat with OMT was based on Physical Exam  After verbal consent patient was treated with HVLA, ME, FPR techniques in cervical, rib, thoracic, lumbar, and sacral  areas  Patient tolerated the procedure well with improvement in symptoms  Patient given exercises, stretches and lifestyle modifications  See medications in patient instructions if given  Patient will follow up in 4-8 weeks    The above documentation has been reviewed and is accurate and complete [name], DO  Note: This dictation was prepared with Dragon dictation along with smaller phrase technology. Any transcriptional errors that result from this process are unintentional.

## 2022-04-10 ENCOUNTER — Ambulatory Visit: Payer: Medicare Other | Admitting: Family Medicine

## 2022-04-10 VITALS — BP 130/88 | HR 93 | Ht 68.0 in | Wt 186.0 lb

## 2022-04-10 DIAGNOSIS — M9904 Segmental and somatic dysfunction of sacral region: Secondary | ICD-10-CM | POA: Diagnosis not present

## 2022-04-10 DIAGNOSIS — M9902 Segmental and somatic dysfunction of thoracic region: Secondary | ICD-10-CM | POA: Diagnosis not present

## 2022-04-10 DIAGNOSIS — M545 Low back pain, unspecified: Secondary | ICD-10-CM

## 2022-04-10 DIAGNOSIS — M9903 Segmental and somatic dysfunction of lumbar region: Secondary | ICD-10-CM

## 2022-04-10 DIAGNOSIS — M9901 Segmental and somatic dysfunction of cervical region: Secondary | ICD-10-CM

## 2022-04-10 DIAGNOSIS — M9908 Segmental and somatic dysfunction of rib cage: Secondary | ICD-10-CM | POA: Diagnosis not present

## 2022-04-10 NOTE — Patient Instructions (Signed)
See you again in 5 weeks after epidural (04/17/2021)

## 2022-04-10 NOTE — Assessment & Plan Note (Signed)
Patient is doing relatively well overall.  Is doing better with some range of motion.  Attempted osteopathic manipulation.  Patient would like to go through with the epidural to see if he can get even more improvement that is scheduled for February 5.  Discussed icing regimen and home exercises.  Follow-up again in 6 weeks

## 2022-04-14 ENCOUNTER — Encounter: Payer: Self-pay | Admitting: Family Medicine

## 2022-04-17 ENCOUNTER — Ambulatory Visit
Admission: RE | Admit: 2022-04-17 | Discharge: 2022-04-17 | Disposition: A | Discharge status: Home / Self Care | Payer: Medicare Other | Source: Ambulatory Visit | Attending: Family Medicine | Admitting: Family Medicine

## 2022-04-17 DIAGNOSIS — M5416 Radiculopathy, lumbar region: Secondary | ICD-10-CM

## 2022-04-17 MED ORDER — METHYLPREDNISOLONE ACETATE 40 MG/ML INJ SUSP (RADIOLOG
80.0000 mg | Freq: Once | INTRAMUSCULAR | Status: AC
  Administered 2022-04-17: 80 mg via EPIDURAL

## 2022-04-17 MED ORDER — PREDNISONE 20 MG PO TABS: 40.0000 mg | ORAL_TABLET | Freq: Every day | ORAL | 0 refills | Status: DC

## 2022-04-17 MED ORDER — IOPAMIDOL (ISOVUE-M 200) INJECTION 41%
1.0000 mL | Freq: Once | INTRAMUSCULAR | Status: AC
  Administered 2022-04-17: 1 mL via EPIDURAL

## 2022-04-17 NOTE — Discharge Instructions (Signed)

## 2022-04-27 ENCOUNTER — Ambulatory Visit: Payer: Medicare Other | Admitting: Internal Medicine

## 2022-05-08 ENCOUNTER — Ambulatory Visit (INDEPENDENT_AMBULATORY_CARE_PROVIDER_SITE_OTHER): Payer: Medicare Other | Admitting: Internal Medicine

## 2022-05-08 ENCOUNTER — Encounter: Payer: Self-pay | Admitting: Internal Medicine

## 2022-05-08 VITALS — BP 162/88 | HR 100 | Temp 98.6°F | Resp 16 | Ht 68.0 in | Wt 185.0 lb

## 2022-05-08 DIAGNOSIS — T50905A Adverse effect of unspecified drugs, medicaments and biological substances, initial encounter: Secondary | ICD-10-CM | POA: Diagnosis not present

## 2022-05-08 DIAGNOSIS — R0989 Other specified symptoms and signs involving the circulatory and respiratory systems: Secondary | ICD-10-CM

## 2022-05-08 DIAGNOSIS — I1A0 Resistant hypertension: Secondary | ICD-10-CM

## 2022-05-08 DIAGNOSIS — R251 Tremor, unspecified: Secondary | ICD-10-CM

## 2022-05-08 DIAGNOSIS — I444 Left anterior fascicular block: Secondary | ICD-10-CM | POA: Diagnosis not present

## 2022-05-08 DIAGNOSIS — E785 Hyperlipidemia, unspecified: Secondary | ICD-10-CM | POA: Diagnosis not present

## 2022-05-08 DIAGNOSIS — J4540 Moderate persistent asthma, uncomplicated: Secondary | ICD-10-CM | POA: Diagnosis not present

## 2022-05-08 DIAGNOSIS — F514 Sleep terrors [night terrors]: Secondary | ICD-10-CM | POA: Diagnosis not present

## 2022-05-08 DIAGNOSIS — E782 Mixed hyperlipidemia: Secondary | ICD-10-CM | POA: Insufficient documentation

## 2022-05-08 LAB — URINALYSIS, ROUTINE W REFLEX MICROSCOPIC
Bilirubin Urine: NEGATIVE
Hgb urine dipstick: NEGATIVE
Ketones, ur: NEGATIVE
Leukocytes,Ua: NEGATIVE
Nitrite: NEGATIVE
RBC / HPF: NONE SEEN (ref 0–?)
Specific Gravity, Urine: 1.02 (ref 1.000–1.030)
Total Protein, Urine: NEGATIVE
Urine Glucose: NEGATIVE
Urobilinogen, UA: 0.2 (ref 0.0–1.0)
pH: 7 (ref 5.0–8.0)

## 2022-05-08 LAB — LIPID PANEL
Cholesterol: 182 mg/dL (ref 0–200)
HDL: 58.6 mg/dL (ref >39.00)
LDL Cholesterol: 91 mg/dL (ref 0–99)
NonHDL: 123.38
Total CHOL/HDL Ratio: 3
Triglycerides: 161 mg/dL — ABNORMAL HIGH (ref 0.0–149.0)
VLDL: 32.2 mg/dL (ref 0.0–40.0)

## 2022-05-08 LAB — BASIC METABOLIC PANEL
BUN: 14 mg/dL (ref 6–23)
CO2: 31 mEq/L (ref 19–32)
Calcium: 11 mg/dL — ABNORMAL HIGH (ref 8.4–10.5)
Chloride: 98 mEq/L (ref 96–112)
Creatinine, Ser: 0.96 mg/dL (ref 0.40–1.50)
GFR: 81.48 mL/min (ref >60.00)
Glucose, Bld: 98 mg/dL (ref 70–99)
Potassium: 4.4 mEq/L (ref 3.5–5.1)
Sodium: 136 mEq/L (ref 135–145)

## 2022-05-08 MED ORDER — AMLODIPINE BESYLATE 5 MG PO TABS: 5.0000 mg | ORAL_TABLET | Freq: Every day | ORAL | 0 refills | Status: DC

## 2022-05-08 MED ORDER — TRELEGY ELLIPTA 100-62.5-25 MCG/ACT IN AEPB: 1.0000 | INHALATION_SPRAY | Freq: Every day | RESPIRATORY_TRACT | 1 refills | Status: DC

## 2022-05-08 MED ORDER — OLMESARTAN MEDOXOMIL 20 MG PO TABS: 20.0000 mg | ORAL_TABLET | Freq: Every day | ORAL | 0 refills | Status: DC

## 2022-05-08 MED ORDER — AIRSUPRA 90-80 MCG/ACT IN AERO: 2.0000 | INHALATION_SPRAY | Freq: Four times a day (QID) | RESPIRATORY_TRACT | 3 refills | Status: DC | PRN

## 2022-05-08 MED ORDER — ALPRAZOLAM 0.25 MG PO TABS: 0.2500 mg | ORAL_TABLET | Freq: Every day | ORAL | 0 refills | Status: DC

## 2022-05-08 MED ORDER — ROSUVASTATIN CALCIUM 10 MG PO TABS: 10.0000 mg | ORAL_TABLET | Freq: Every day | ORAL | 1 refills | Status: DC

## 2022-05-08 NOTE — Patient Instructions (Signed)
Hypertension, Adult High blood pressure (hypertension) is when the force of blood pumping through the arteries is too strong. The arteries are the blood vessels that carry blood from the heart throughout the body. Hypertension forces the heart to work harder to pump blood and may cause arteries to become narrow or stiff. Untreated or uncontrolled hypertension can lead to a heart attack, heart failure, a stroke, kidney disease, and other problems. A blood pressure reading consists of a higher number over a lower number. Ideally, your blood pressure should be below 120/80. The first ("top") number is called the systolic pressure. It is a measure of the pressure in your arteries as your heart beats. The second ("bottom") number is called the diastolic pressure. It is a measure of the pressure in your arteries as the heart relaxes. What are the causes? The exact cause of this condition is not known. There are some conditions that result in high blood pressure. What increases the risk? Certain factors may make you more likely to develop high blood pressure. Some of these risk factors are under your control, including: Smoking. Not getting enough exercise or physical activity. Being overweight. Having too much fat, sugar, calories, or salt (sodium) in your diet. Drinking too much alcohol. Other risk factors include: Having a personal history of heart disease, diabetes, high cholesterol, or kidney disease. Stress. Having a family history of high blood pressure and high cholesterol. Having obstructive sleep apnea. Age. The risk increases with age. What are the signs or symptoms? High blood pressure may not cause symptoms. Very high blood pressure (hypertensive crisis) may cause: Headache. Fast or irregular heartbeats (palpitations). Shortness of breath. Nosebleed. Nausea and vomiting. Vision changes. Severe chest pain, dizziness, and seizures. How is this diagnosed? This condition is diagnosed by  measuring your blood pressure while you are seated, with your arm resting on a flat surface, your legs uncrossed, and your feet flat on the floor. The cuff of the blood pressure monitor will be placed directly against the skin of your upper arm at the level of your heart. Blood pressure should be measured at least twice using the same arm. Certain conditions can cause a difference in blood pressure between your right and left arms. If you have a high blood pressure reading during one visit or you have normal blood pressure with other risk factors, you may be asked to: Return on a different day to have your blood pressure checked again. Monitor your blood pressure at home for 1 week or longer. If you are diagnosed with hypertension, you may have other blood or imaging tests to help your health care provider understand your overall risk for other conditions. How is this treated? This condition is treated by making healthy lifestyle changes, such as eating healthy foods, exercising more, and reducing your alcohol intake. You may be referred for counseling on a healthy diet and physical activity. Your health care provider may prescribe medicine if lifestyle changes are not enough to get your blood pressure under control and if: Your systolic blood pressure is above 130. Your diastolic blood pressure is above 80. Your personal target blood pressure may vary depending on your medical conditions, your age, and other factors. Follow these instructions at home: Eating and drinking  Eat a diet that is high in fiber and potassium, and low in sodium, added sugar, and fat. An example of this eating plan is called the DASH diet. DASH stands for Dietary Approaches to Stop Hypertension. To eat this way: Eat   plenty of fresh fruits and vegetables. Try to fill one half of your plate at each meal with fruits and vegetables. Eat whole grains, such as whole-wheat pasta, brown rice, or whole-grain bread. Fill about one  fourth of your plate with whole grains. Eat or drink low-fat dairy products, such as skim milk or low-fat yogurt. Avoid fatty cuts of meat, processed or cured meats, and poultry with skin. Fill about one fourth of your plate with lean proteins, such as fish, chicken without skin, beans, eggs, or tofu. Avoid pre-made and processed foods. These tend to be higher in sodium, added sugar, and fat. Reduce your daily sodium intake. Many people with hypertension should eat less than 1,500 mg of sodium a day. Do not drink alcohol if: Your health care provider tells you not to drink. You are pregnant, may be pregnant, or are planning to become pregnant. If you drink alcohol: Limit how much you have to: 0-1 drink a day for women. 0-2 drinks a day for men. Know how much alcohol is in your drink. In the [place] one drink equals one 12 oz bottle of beer (355 mL), one 5 oz glass of wine (148 mL), or one 1 oz glass of hard liquor (44 mL). Lifestyle  Work with your health care provider to maintain a healthy body weight or to lose weight. Ask what an ideal weight is for you. Get at least 30 minutes of exercise that causes your heart to beat faster (aerobic exercise) most days of the week. Activities may include walking, swimming, or biking. Include exercise to strengthen your muscles (resistance exercise), such as Pilates or lifting weights, as part of your weekly exercise routine. Try to do these types of exercises for 30 minutes at least 3 days a week. Do not use any products that contain nicotine or tobacco. These products include cigarettes, chewing tobacco, and vaping devices, such as e-cigarettes. If you need help quitting, ask your health care provider. Monitor your blood pressure at home as told by your health care provider. Keep all follow-up visits. This is important. Medicines Take over-the-counter and prescription medicines only as told by your health care provider. Follow directions carefully. Blood  pressure medicines must be taken as prescribed. Do not skip doses of blood pressure medicine. Doing this puts you at risk for problems and can make the medicine less effective. Ask your health care provider about side effects or reactions to medicines that you should watch for. Contact a health care provider if you: Think you are having a reaction to a medicine you are taking. Have headaches that keep coming back (recurring). Feel dizzy. Have swelling in your ankles. Have trouble with your vision. Get help right away if you: Develop a severe headache or confusion. Have unusual weakness or numbness. Feel faint. Have severe pain in your chest or abdomen. Vomit repeatedly. Have trouble breathing. These symptoms may be an emergency. Get help right away. Call 911. Do not wait to see if the symptoms will go away. Do not drive yourself to the hospital. Summary Hypertension is when the force of blood pumping through your arteries is too strong. If this condition is not controlled, it may put you at risk for serious complications. Your personal target blood pressure may vary depending on your medical conditions, your age, and other factors. For most people, a normal blood pressure is less than 120/80. Hypertension is treated with lifestyle changes, medicines, or a combination of both. Lifestyle changes include losing weight, eating a healthy,   low-sodium diet, exercising more, and limiting alcohol. This information is not intended to replace advice given to you by your health care provider. Make sure you discuss any questions you have with your health care provider. Document Revised: 01/04/2021 Document Reviewed: 01/04/2021 Elsevier Patient Education  2023 [employer] Inc[employer]  

## 2022-05-08 NOTE — Progress Notes (Signed)
Subjective:  Patient ID: Edwin Brown, male    DOB: January 22, 1956  Age: 67 y.o. MRN: [id number]  CC: Hypertension, Hyperlipidemia, and Asthma   HPI Edwin Brown presents for establishing.  He complains of chronic low back pain, fatigue, intermittent wheezing, tremor, and nightmares.   History Edwin Brown has a past medical history of HTN (hypertension) and Syncope.   He has a past surgical history that includes Nasal sinus surgery; Appendectomy; Vasectomy; and Cholecystectomy (N/A, 01/30/2014).   His family history includes Heart attack in his brother and father.He reports that he has never smoked. He has never used smokeless tobacco. He reports current alcohol use of about 18.0 standard drinks of alcohol per week. He reports that he does not use drugs.  Outpatient Medications Prior to Visit  Medication Sig Dispense Refill   gabapentin (NEURONTIN) 100 MG capsule Take 2 capsules (200 mg total) by mouth 2 (two) times daily. 120 capsule 1   loratadine (CLARITIN) 10 MG tablet Take 10 mg by mouth daily as needed for allergies.     Turmeric Curcumin 500 MG CAPS Take 1,000 mg by mouth daily.     albuterol (PROVENTIL HFA;VENTOLIN HFA) 108 (90 BASE) MCG/ACT inhaler Inhale 1 puff into the lungs every 6 (six) hours as needed for wheezing or shortness of breath.     ALPRAZolam (XANAX) 0.25 MG tablet Take 0.25 mg by mouth at bedtime as needed for anxiety.     AMBULATORY NON FORMULARY MEDICATION Left wrist brace 1 Units 0   clarithromycin (BIAXIN) 500 MG tablet Take 500 mg by mouth 2 (two) times daily. For 10 days     Diclofenac Sodium 2 % SOLN Apply 1 pump twice daily. 112 g 3   Glucos-MSM-C-Mn-Ginger-Willow (GLUCOSAMINE MSM COMPLEX PO) Take 2 capsules by mouth daily.      losartan (COZAAR) 100 MG tablet Take 100 mg by mouth daily.     predniSONE (DELTASONE) 20 MG tablet Take 2 tablets (40 mg total) by mouth daily with breakfast. 10 tablet 0   tiZANidine (ZANAFLEX) 4 MG tablet Take 1 tablet (4 mg  total) by mouth at bedtime. 30 tablet 0   tiZANidine (ZANAFLEX) 4 MG tablet Take 1 tablet (4 mg total) by mouth at bedtime. 30 tablet 0   traMADol (ULTRAM) 50 MG tablet Take 50 mg by mouth every 4 (four) hours as needed for moderate pain.     No facility-administered medications prior to visit.    ROS Review of Systems  Constitutional:  Positive for fatigue. Negative for appetite change, chills, diaphoresis and unexpected weight change.  HENT: Negative.  Negative for trouble swallowing.   Eyes: Negative.   Respiratory:  Positive for wheezing. Negative for cough, chest tightness and shortness of breath.   Cardiovascular:  Negative for chest pain, palpitations and leg swelling.  Gastrointestinal:  Negative for abdominal pain, constipation, diarrhea, nausea and vomiting.  Endocrine: Negative.   Genitourinary: Negative.  Negative for difficulty urinating.  Musculoskeletal:  Positive for back pain. Negative for arthralgias and myalgias.  Skin: Negative.   Neurological: Negative.  Negative for dizziness and weakness.  Hematological:  Negative for adenopathy. Does not bruise/bleed easily.  Psychiatric/Behavioral:  Positive for sleep disturbance. Negative for confusion, decreased concentration, dysphoric mood, self-injury and suicidal ideas. The patient is nervous/anxious.     Objective:  BP (!) 162/88 (BP Location: Right Arm, Patient Position: Sitting, Cuff Size: Large)   Pulse 100   Temp 98.6 F (37 C) (Oral)   Resp 16  Ht '5\' 8"'$  (1.727 m)   Wt 185 lb (83.9 kg)   SpO2 96%   BMI 28.13 kg/m   Physical Exam Vitals reviewed.  HENT:     Nose: Nose normal.     Mouth/Throat:     Mouth: Mucous membranes are moist.  Eyes:     General: No scleral icterus.    Conjunctiva/sclera: Conjunctivae normal.  Cardiovascular:     Rate and Rhythm: Normal rate and regular rhythm.     Heart sounds: No murmur heard.    No gallop.     Comments: EKG- NSR, 93 bpm Incomplete RBBB, LAFB Unchanged No  LVH or Q waves Pulmonary:     Effort: Pulmonary effort is normal.     Breath sounds: No stridor. Rhonchi present. No wheezing or rales.     Comments: Diffuse bilateral exp rhonchi Chest:     Chest wall: No tenderness.  Abdominal:     General: Abdomen is flat.     Palpations: There is no mass.     Tenderness: There is no abdominal tenderness. There is no guarding.     Hernia: No hernia is present.  Musculoskeletal:     Cervical back: Neck supple.     Right lower leg: No edema.     Left lower leg: No edema.  Lymphadenopathy:     Cervical: No cervical adenopathy.  Skin:    General: Skin is warm and dry.     Coloration: Skin is not pale.  Neurological:     General: No focal deficit present.     Mental Status: He is alert. Mental status is at baseline.  Psychiatric:        Mood and Affect: Mood normal.        Behavior: Behavior normal.     Lab Results  Component Value Date   WBC 5.0 02/01/2022   HGB 14.8 02/01/2022   HCT 43.6 02/01/2022   PLT 267.0 02/01/2022   GLUCOSE 98 05/08/2022   CHOL 182 05/08/2022   TRIG 161.0 (H) 05/08/2022   HDL 58.60 05/08/2022   LDLCALC 91 05/08/2022   ALT 17 02/01/2022   AST 19 02/01/2022   NA 136 05/08/2022   K 4.4 05/08/2022   CL 98 05/08/2022   CREATININE 0.96 05/08/2022   BUN 14 05/08/2022   CO2 31 05/08/2022   TSH 1.12 02/01/2022   PSA 2.68 08/01/2021     Assessment & Plan:   Edwin Brown was seen today for hypertension, hyperlipidemia and asthma.  Diagnoses and all orders for this visit:  Resistant hypertension- He has hypercalcemia so we will discontinue the thiazide diuretic.  Will evaluate for secondary causes and endorgan damage.  Will control the blood pressure with an ARB and CCB. -     Aldosterone + renin activity w/ ratio; Future -     Urinalysis, Routine w reflex microscopic; Future -     Basic metabolic panel; Future -     EKG 12-Lead -     Basic metabolic panel -     Urinalysis, Routine w reflex microscopic -      Aldosterone + renin activity w/ ratio -     olmesartan (BENICAR) 20 MG tablet; Take 1 tablet (20 mg total) by mouth daily. -     amLODipine (NORVASC) 5 MG tablet; Take 1 tablet (5 mg total) by mouth daily.  Dyslipidemia, goal LDL below 100- I have asked him to take a statin for cardiovascular risk reduction. -     Lipid  panel; Future -     Lipid panel -     rosuvastatin (CRESTOR) 10 MG tablet; Take 1 tablet (10 mg total) by mouth daily.  Unequal blood pressure in upper extremities- Will evaluate for coarctation of the aorta. -     CT Angio Chest W/Cm &/Or Wo Cm; Future  LAFB (left anterior fascicular block) -     Ambulatory referral to Cardiology  Moderate persistent asthma without complication -     Fluticasone-Umeclidin-Vilant (TRELEGY ELLIPTA) 100-62.5-25 MCG/ACT AEPB; Inhale 1 puff into the lungs daily. -     Albuterol-Budesonide (AIRSUPRA) 90-80 MCG/ACT AERO; Inhale 2 puffs into the lungs 4 (four) times daily as needed.  Night terrors, adult -     ALPRAZolam (XANAX) 0.25 MG tablet; Take 1 tablet (0.25 mg total) by mouth at bedtime.  Tremor -     Ambulatory referral to Neurology  Hypercalcemia due to a drug- Will discontinue the thiazide diuretic.   I have discontinued Harjot Palmateer. Daniely's losartan, Glucos-MSM-C-Mn-Ginger-Willow (GLUCOSAMINE MSM COMPLEX PO), traMADol, albuterol, clarithromycin, Diclofenac Sodium, AMBULATORY NON FORMULARY MEDICATION, tiZANidine, tiZANidine, and predniSONE. I have also changed his ALPRAZolam. Additionally, I am having him start on Trelegy Ellipta, Airsupra, olmesartan, amLODipine, and rosuvastatin. Lastly, I am having him maintain his loratadine, Turmeric Curcumin, and gabapentin.  Meds ordered this encounter  Medications   Fluticasone-Umeclidin-Vilant (TRELEGY ELLIPTA) 100-62.5-25 MCG/ACT AEPB    Sig: Inhale 1 puff into the lungs daily.    Dispense:  120 each    Refill:  1   ALPRAZolam (XANAX) 0.25 MG tablet    Sig: Take 1 tablet (0.25 mg total)  by mouth at bedtime.    Dispense:  90 tablet    Refill:  0   Albuterol-Budesonide (AIRSUPRA) 90-80 MCG/ACT AERO    Sig: Inhale 2 puffs into the lungs 4 (four) times daily as needed.    Dispense:  10.7 g    Refill:  3   olmesartan (BENICAR) 20 MG tablet    Sig: Take 1 tablet (20 mg total) by mouth daily.    Dispense:  90 tablet    Refill:  0   amLODipine (NORVASC) 5 MG tablet    Sig: Take 1 tablet (5 mg total) by mouth daily.    Dispense:  90 tablet    Refill:  0   rosuvastatin (CRESTOR) 10 MG tablet    Sig: Take 1 tablet (10 mg total) by mouth daily.    Dispense:  90 tablet    Refill:  1     Follow-up: Return in about 3 months (around 08/06/2022).  Scarlette Calico, MD

## 2022-05-09 ENCOUNTER — Encounter: Payer: Self-pay | Admitting: Internal Medicine

## 2022-05-13 LAB — ALDOSTERONE + RENIN ACTIVITY W/ RATIO
ALDO / PRA Ratio: 0.7 Ratio — ABNORMAL LOW (ref 0.9–28.9)
Aldosterone: 3 ng/dL
Renin Activity: 4.42 ng/mL/h (ref 0.25–5.82)

## 2022-05-15 ENCOUNTER — Encounter: Payer: Self-pay | Admitting: Neurology

## 2022-05-19 NOTE — Progress Notes (Unsigned)
[place] Phone: [phone] Subjective:   Edwin Brown, am serving as a scribe for Dr. Hulan Saas.  I'm seeing this patient by the request  of:  Janith Lima, MD  CC: Lower back    [id number]  Edwin Brown is a 67 y.o. male coming in with complaint of back and neck pain. OMT 04/10/2022. Patient states that after 3rd epidural he finally had some improvement. Still feels like pain is not manageable. If he does some laundry and walks the dogs, the next day he will have an increase in his pain. Pain occurring in R hip and L side of thoracic spine. Brown longer using gabapentin or zanaflex.   Medications patient has been prescribed: Gabapentin  Taking:         Reviewed prior external information including notes and imaging from previsou exam, outside providers and external EMR if available.   As well as notes that were available from care everywhere and other healthcare systems.  Past medical history, social, surgical and family history all reviewed in electronic medical record.  Brown pertanent information unless stated regarding to the chief complaint.   Past Medical History:  Diagnosis Date   HTN (hypertension)    Syncope     Allergies  Allergen Reactions   Dilaudid [Hydromorphone Hcl] Shortness Of Breath   Phenergan [Promethazine Hcl] Other (See Comments)    Shaky, very out of sorts    Other     HORSE SERUM TETANUS ANTI TOX : PATIENT GETS HIGH FEVER     Review of Systems:  Brown headache, visual changes, nausea, vomiting, diarrhea, constipation, dizziness, abdominal pain, skin rash, fevers, chills, night sweats, weight loss, swollen lymph nodes, body aches, joint swelling, chest pain, shortness of breath, mood changes. POSITIVE muscle aches  Objective  Blood pressure 132/86, pulse 94, height '5\' 8"'$  (1.727 m), weight 189 lb (85.7 kg), SpO2 95 %.   General: Brown apparent distress alert and  oriented x3 mood and affect normal, dressed appropriately.  HEENT: Pupils equal, extraocular movements intact  Respiratory: Patient's speak in full sentences and does not appear short of breath  Cardiovascular: Brown lower extremity edema, non tender, Brown erythema  Exam today does show the patient does have significant loss of lordosis of the lumbar spine.  Still has tightness with straight leg test and FABER test right greater than left.  Lacks any extension greater than 5 degrees.  More tenderness noted in the thoracolumbar juncture right greater than left.  Patient does have very mild scapular dyskinesis noted.  Osteopathic findings  T9 extended rotated and side bent right        Assessment and Plan:  Degenerative disc disease, lumbar Patient does have degenerative disc disease and does have a pars defect noted at L5 that has caused significant arthritic changes at the L4-L5 and L5-S1 area.  Patient continues to have signs and symptoms consistent with more of the instability of the back.  Patient has responded well to the epidurals and has had 3 over the course of this year in which 1 does seem to help it but unfortunately they are only temporary.  Patient is looking forward to the next steps.  Clinical able to do more than daily activities without any significant discomfort including walking the dog.  Patient would be interested in the possibility of surgical intervention and will refer the patient to orthopedic surgery to discuss further.  Total time reviewing  patient's imaging and discussing with patient 32 minutes    Nonallopathic problems  Decision today to treat with OMT was based on Physical Exam  After verbal consent patient was treated with HVLA, techniques in thoracic, areas  Patient tolerated the procedure well with improvement in symptoms  Patient given exercises, stretches and lifestyle modifications  See medications in patient instructions if given  Patient will follow  up in 4-8 weeks      The above documentation has been reviewed and is accurate and complete [name], DO        Note: This dictation was prepared with Dragon dictation along with smaller phrase technology. Any transcriptional errors that result from this process are unintentional.

## 2022-05-22 ENCOUNTER — Encounter: Payer: Self-pay | Admitting: Family Medicine

## 2022-05-22 ENCOUNTER — Ambulatory Visit: Payer: Medicare Other | Admitting: Family Medicine

## 2022-05-22 VITALS — BP 132/86 | HR 94 | Ht 68.0 in | Wt 189.0 lb

## 2022-05-22 DIAGNOSIS — M5136 Other intervertebral disc degeneration, lumbar region: Secondary | ICD-10-CM | POA: Insufficient documentation

## 2022-05-22 DIAGNOSIS — M9902 Segmental and somatic dysfunction of thoracic region: Secondary | ICD-10-CM | POA: Diagnosis not present

## 2022-05-22 DIAGNOSIS — M5416 Radiculopathy, lumbar region: Secondary | ICD-10-CM | POA: Diagnosis not present

## 2022-05-22 DIAGNOSIS — M51369 Other intervertebral disc degeneration, lumbar region without mention of lumbar back pain or lower extremity pain: Secondary | ICD-10-CM

## 2022-05-22 NOTE — Patient Instructions (Signed)
Referral to neurosurgery Dr. Acey Lav to continue to be active You're in good hands with cardiology See you again in 2-3 months just in case

## 2022-05-22 NOTE — Assessment & Plan Note (Signed)
Patient does have degenerative disc disease and does have a pars defect noted at L5 that has caused significant arthritic changes at the L4-L5 and L5-S1 area.  Patient continues to have signs and symptoms consistent with more of the instability of the back.  Patient has responded well to the epidurals and has had 3 over the course of this year in which 1 does seem to help it but unfortunately they are only temporary.  Patient is looking forward to the next steps.  Clinical able to do more than daily activities without any significant discomfort including walking the dog.  Patient would be interested in the possibility of surgical intervention and will refer the patient to orthopedic surgery to discuss further.  Total time reviewing patient's imaging and discussing with patient 32 minutes

## 2022-05-23 ENCOUNTER — Ambulatory Visit (HOSPITAL_BASED_OUTPATIENT_CLINIC_OR_DEPARTMENT_OTHER): Payer: Medicare Other | Admitting: Cardiology

## 2022-05-23 ENCOUNTER — Telehealth: Payer: Self-pay

## 2022-05-23 ENCOUNTER — Encounter (HOSPITAL_BASED_OUTPATIENT_CLINIC_OR_DEPARTMENT_OTHER): Payer: Self-pay | Admitting: Cardiology

## 2022-05-23 VITALS — BP 152/82 | HR 97 | Ht 68.0 in | Wt 189.0 lb

## 2022-05-23 DIAGNOSIS — I1A0 Resistant hypertension: Secondary | ICD-10-CM

## 2022-05-23 DIAGNOSIS — Z7189 Other specified counseling: Secondary | ICD-10-CM | POA: Diagnosis not present

## 2022-05-23 DIAGNOSIS — I10 Essential (primary) hypertension: Secondary | ICD-10-CM | POA: Diagnosis not present

## 2022-05-23 DIAGNOSIS — Z9189 Other specified personal risk factors, not elsewhere classified: Secondary | ICD-10-CM | POA: Diagnosis not present

## 2022-05-23 DIAGNOSIS — I444 Left anterior fascicular block: Secondary | ICD-10-CM | POA: Diagnosis not present

## 2022-05-23 DIAGNOSIS — J4540 Moderate persistent asthma, uncomplicated: Secondary | ICD-10-CM | POA: Diagnosis not present

## 2022-05-23 NOTE — Telephone Encounter (Signed)
ATC x1 LMTCB for appt with Dr Valeta Harms, Ref By Dr Rhodia Albright

## 2022-05-23 NOTE — Progress Notes (Signed)
Cardiology Office Note:    Date:  05/23/2022   ID:  Edwin Brown, DOB 15-Feb-1956, MRN [id number]  PCP:  Janith Lima, MD  Cardiologist:  Buford Dresser, MD  Referring MD: Janith Lima, MD   CC: new patient evaluation for LAFB  History of Present Illness:    Edwin Brown is a 67 y.o. male with a hx of hypertension, LAFB who is seen as a new consult at the request of Janith Lima, MD for the evaluation and management of LAFB.  Note from Dr. Ronnald Ramp dated 05/08/22 reviewed. ECG noted iRBBB, LAFB. Evaluation for hypertension, unequal pressures in arms. Referred to cardiology for abnormal ECG. Noted hypercalcemia on thiazide.  Today, the patient presents with his wife who is a Marine scientist. He has been doing well. He has never been a smoker, but he has had respiratory problems and feels limited by his breathing. His wife states that he snores very loud.  As for his family history, his father died from MI when was 35. His brother died younger than 61 due to kidney failure and diabetes. Both of his grandfathers lived until 77, his mom has no significant cardiac issues, she has had a few strokes that were due to DVT, she is now 20.  His blood pressure in clinic today is 150/70 on recheck it was 152/82. He has been dealing with high blood pressure ever since he was a child. It is the same in his father, brother and son. He has only been on medicine for the past 5 years for it.    He denies any palpitations, chest pain, or peripheral edema. No lightheadedness, headaches, syncope, orthopnea, or PND.   Past Medical History:  Diagnosis Date   HTN (hypertension)    Syncope     Past Surgical History:  Procedure Laterality Date   APPENDECTOMY     CHOLECYSTECTOMY N/A 01/30/2014   Procedure: LAPAROSCOPIC CHOLECYSTECTOMY WITH INTRAOPERATIVE CHOLANGIOGRAM;  Surgeon: Pedro Earls, MD;  Location: [facility];  Service: General;  Laterality: N/A;   NASAL SINUS SURGERY     VASECTOMY       Current Medications: Current Outpatient Medications on File Prior to Visit  Medication Sig   Albuterol-Budesonide (AIRSUPRA) 90-80 MCG/ACT AERO Inhale 2 puffs into the lungs 4 (four) times daily as needed.   ALPRAZolam (XANAX) 0.25 MG tablet Take 1 tablet (0.25 mg total) by mouth at bedtime.   amLODipine (NORVASC) 5 MG tablet Take 1 tablet (5 mg total) by mouth daily.   Fluticasone-Umeclidin-Vilant (TRELEGY ELLIPTA) 100-62.5-25 MCG/ACT AEPB Inhale 1 puff into the lungs daily.   loratadine (CLARITIN) 10 MG tablet Take 10 mg by mouth daily as needed for allergies.   olmesartan (BENICAR) 20 MG tablet Take 1 tablet (20 mg total) by mouth daily.   rosuvastatin (CRESTOR) 10 MG tablet Take 1 tablet (10 mg total) by mouth daily.   Turmeric Curcumin 500 MG CAPS Take 1,000 mg by mouth daily.   No current facility-administered medications on file prior to visit.     Allergies:   Dilaudid [hydromorphone hcl], Phenergan [promethazine hcl], and Other   Social History   Tobacco Use   Smoking status: Never   Smokeless tobacco: Never  Substance Use Topics   Alcohol use: Yes    Alcohol/week: 18.0 standard drinks of alcohol    Types: 8 Glasses of wine, 10 Cans of beer per week   Drug use: No    Family History: family history includes Heart attack in  his brother and father.  ROS:   Please see the history of present illness.    Additional pertinent ROS: Constitutional: Negative for chills, fever, night sweats, unintentional weight loss  HENT: Negative for ear pain and hearing loss.   Eyes: Negative for loss of vision and eye pain.  Respiratory: (+) Coughing (+) Wheezing Cardiovascular: See HPI. Gastrointestinal: Negative for abdominal pain, melena, and hematochezia.  Genitourinary: Negative for dysuria and hematuria.  Musculoskeletal: Negative for falls and myalgias.  Skin: Negative for itching and rash.  Neurological: Negative for focal weakness, focal sensory changes and loss of  consciousness.  Endo/Heme/Allergies: Does not bruise/bleed easily.     EKGs/Labs/Other Studies Reviewed:    The following studies were reviewed today: No prior cardiac studies  EKG:  EKG is personally reviewed.   05/23/2022: not ordered today  Recent Labs: 02/01/2022: ALT 17; Hemoglobin 14.8; Platelets 267.0; TSH 1.12 05/08/2022: BUN 14; Creatinine, Ser 0.96; Potassium 4.4; Sodium 136  Recent Lipid Panel    Component Value Date/Time   CHOL 182 05/08/2022 1037   TRIG 161.0 (H) 05/08/2022 1037   HDL 58.60 05/08/2022 1037   CHOLHDL 3 05/08/2022 1037   VLDL 32.2 05/08/2022 1037   LDLCALC 91 05/08/2022 1037    Physical Exam:    VS:  BP (!) 152/82 (BP Location: Right Arm, Patient Position: Sitting, Cuff Size: Normal)   Pulse 97   Ht 5\' 8"  (1.727 m)   Wt 189 lb (85.7 kg)   BMI 28.74 kg/m     Wt Readings from Last 3 Encounters:  05/22/22 189 lb (85.7 kg)  05/08/22 185 lb (83.9 kg)  04/10/22 186 lb (84.4 kg)    GEN: Well nourished, well developed in no acute distress HEENT: Normal, moist mucous membranes NECK: No JVD CARDIAC: regular rhythm, normal S1 and S2, no rubs or gallops. No murmur. VASCULAR: Radial and DP pulses 2+ bilaterally. No carotid bruits RESPIRATORY:  Clear to auscultation without rales, +diffuse wheezing ABDOMEN: Soft, non-tender, non-distended MUSCULOSKELETAL:  Ambulates independently SKIN: Warm and dry, no edema NEUROLOGIC:  Alert and oriented x 3. No focal neuro deficits noted. PSYCHIATRIC:  Normal affect    ASSESSMENT:    1. Primary hypertension   2. Moderate persistent asthma without complication   3. LAFB (left anterior fascicular block)   4. Cardiac risk counseling   5. Counseling on health promotion and disease prevention   6. At increased risk for cardiovascular disease    PLAN:    Hypertension -reviewed home BP readings, cuffs. He will take home readings and send me a log in a few weeks -for now, continue amlodipine, olmesartan -avoid  beta blockers for now given wheezing  Shortness of breath, wheezing Moderate persistent asthma -will refer to pulmonology given significant and persistent symptoms  LAFB -low risk, no further evaluation needed  Elevated ASCVD risk -on rosuvastatin  Cardiac risk counseling and prevention recommendations: -recommend heart healthy/Mediterranean diet, with whole grains, fruits, vegetable, fish, lean meats, nuts, and olive oil. Limit salt. -recommend moderate walking, 3-5 times/week for 30-50 minutes each session. Aim for at least 150 minutes.week. Goal should be pace of 3 miles/hours, or walking 1.5 miles in 30 minutes -recommend avoidance of tobacco products. Avoid excess alcohol. -ASCVD risk score: The 10-year ASCVD risk score (Arnett DK, et al., 2019) is: 16.9%   Values used to calculate the score:     Age: 24 years     Sex: Male     Is Non-Hispanic African American: No  Diabetic: No     Tobacco smoker: No     Systolic Blood Pressure: Q000111Q mmHg     Is BP treated: Yes     HDL Cholesterol: 58.6 mg/dL     Total Cholesterol: 182 mg/dL    Plan for follow up: 3 months  Buford Dresser, MD, PhD, [facility] at [facility] at [facility] [address] [place], [place] [address] [phone]   Medication Adjustments/Labs and Tests Ordered: Current medicines are reviewed at length with the patient today.  Concerns regarding medicines are outlined above.  Orders Placed This Encounter  Procedures   Ambulatory referral to Pulmonology   No orders of the defined types were placed in this encounter.   Patient Instructions  Medication Instructions:  Your physician recommends that you continue on your current medications as directed. Please refer to the Current Medication list given to you today.  *If you need a refill on your cardiac medications before your next appointment, please call  your pharmacy*  Lab Work: NONE  Testing/Procedures: NONE  Follow-Up: At [facility], you and your health needs are our priority.  As part of our continuing mission to provide you with exceptional heart care, we have created designated Provider Care Teams.  These Care Teams include your primary Cardiologist (physician) and Advanced Practice Providers (APPs -  Physician Assistants and Nurse Practitioners) who all work together to provide you with the care you need, when you need it.  We recommend signing up for the patient portal called "MyChart".  Sign up information is provided on this After Visit Summary.  MyChart is used to connect with patients for Virtual Visits (Telemedicine).  Patients are able to view lab/test results, encounter notes, upcoming appointments, etc.  Non-urgent messages can be sent to your provider as well.   To learn more about what you can do with MyChart, go to [email or web address].    Your next appointment:   3 month(s)  The format for your next appointment:   In Person  Provider:   Buford Dresser, MD    how to check blood pressure:  -sit comfortably in a chair, feet uncrossed and flat on floor, for 5-10 minutes  -arm ideally should rest at the level of the heart. However, arm should be relaxed and not tense (for example, do not hold the arm up unsupported)  -avoid exercise, caffeine, and tobacco for at least 30 minutes prior to BP reading  -don't take BP cuff reading over clothes (always place on skin directly)  -I prefer to know how well the medication is working, so I would like you to take your readings 1-2 hours after taking your blood pressure medication if possible   I like the Omron arm cuffs. Avoid wrist cuffs if you can.  Send me some readings via mychart in a few weeks and we will make adjustments.   I,Coren O'Brien,acting as a Education administrator for [employer], MD.,have documented all relevant documentation on the behalf of  Buford Dresser, MD,as directed by  Buford Dresser, MD while in the presence of Buford Dresser, MD.  I, Buford Dresser, MD, have reviewed all documentation for this visit. The documentation on 06/06/22 for the exam, diagnosis, procedures, and orders are all accurate and complete.

## 2022-05-23 NOTE — Progress Notes (Incomplete)
Cardiology Office Note:    Date:  05/23/2022   ID:  Edwin Brown, DOB [birth year], MRN [id number]  PCP:  Janith Lima, MD  Cardiologist:  None  Referring MD: Janith Lima, MD   No chief complaint on file.   History of Present Illness:    Edwin Brown is a 67 y.o. male with a hx of hypertension, LAFB who is seen as a new consult at the request of Janith Lima, MD for the evaluation and management of LAFB.  Note from Dr. Ronnald Ramp dated 05/08/22 reviewed. ECG noted iRBBB, LAFB. Evaluation for hypertension, unequal pressures in arms. Referred to cardiology for abnormal ECG. Noted hypercalcemia on thiazide.  Past Medical History:  Diagnosis Date   HTN (hypertension)    Syncope     Past Surgical History:  Procedure Laterality Date   APPENDECTOMY     CHOLECYSTECTOMY N/A 01/30/2014   Procedure: LAPAROSCOPIC CHOLECYSTECTOMY WITH INTRAOPERATIVE CHOLANGIOGRAM;  Surgeon: Pedro Earls, MD;  Location: [facility];  Service: General;  Laterality: N/A;   NASAL SINUS SURGERY     VASECTOMY      Current Medications: Current Outpatient Medications on File Prior to Visit  Medication Sig   Albuterol-Budesonide (AIRSUPRA) 90-80 MCG/ACT AERO Inhale 2 puffs into the lungs 4 (four) times daily as needed.   ALPRAZolam (XANAX) 0.25 MG tablet Take 1 tablet (0.25 mg total) by mouth at bedtime.   amLODipine (NORVASC) 5 MG tablet Take 1 tablet (5 mg total) by mouth daily.   Fluticasone-Umeclidin-Vilant (TRELEGY ELLIPTA) 100-62.5-25 MCG/ACT AEPB Inhale 1 puff into the lungs daily.   loratadine (CLARITIN) 10 MG tablet Take 10 mg by mouth daily as needed for allergies.   olmesartan (BENICAR) 20 MG tablet Take 1 tablet (20 mg total) by mouth daily.   rosuvastatin (CRESTOR) 10 MG tablet Take 1 tablet (10 mg total) by mouth daily.   Turmeric Curcumin 500 MG CAPS Take 1,000 mg by mouth daily.   No current facility-administered medications on file prior to visit.     Allergies:   Dilaudid [hydromorphone  hcl], Phenergan [promethazine hcl], and Other   Social History   Tobacco Use   Smoking status: Never   Smokeless tobacco: Never  Substance Use Topics   Alcohol use: Yes    Alcohol/week: 18.0 standard drinks of alcohol    Types: 8 Glasses of wine, 10 Cans of beer per week   Drug use: No    Family History: family history includes Heart attack in his brother and father.  ROS:   Please see the history of present illness.  Additional pertinent ROS: Constitutional: Negative for chills, fever, night sweats, unintentional weight loss  HENT: Negative for ear pain and hearing loss.   Eyes: Negative for loss of vision and eye pain.  Respiratory: Negative for cough, sputum, wheezing.   Cardiovascular: See HPI. Gastrointestinal: Negative for abdominal pain, melena, and hematochezia.  Genitourinary: Negative for dysuria and hematuria.  Musculoskeletal: Negative for falls and myalgias.  Skin: Negative for itching and rash.  Neurological: Negative for focal weakness, focal sensory changes and loss of consciousness.  Endo/Heme/Allergies: Does not bruise/bleed easily.     EKGs/Labs/Other Studies Reviewed:    The following studies were reviewed today: ***  EKG:  EKG is personally reviewed.   ***  Recent Labs: 02/01/2022: ALT 17; Hemoglobin 14.8; Platelets 267.0; TSH 1.12 05/08/2022: BUN 14; Creatinine, Ser 0.96; Potassium 4.4; Sodium 136  Recent Lipid Panel    Component Value Date/Time   CHOL  182 05/08/2022 1037   TRIG 161.0 (H) 05/08/2022 1037   HDL 58.60 05/08/2022 1037   CHOLHDL 3 05/08/2022 1037   VLDL 32.2 05/08/2022 1037   LDLCALC 91 05/08/2022 1037    Physical Exam:    VS:  There were no vitals taken for this visit.    Wt Readings from Last 3 Encounters:  05/22/22 189 lb (85.7 kg)  05/08/22 185 lb (83.9 kg)  04/10/22 186 lb (84.4 kg)    GEN: Well nourished, well developed in no acute distress HEENT: Normal, moist mucous membranes NECK: No JVD CARDIAC: regular  rhythm, normal S1 and S2, no rubs or gallops. No murmur. VASCULAR: Radial and DP pulses 2+ bilaterally. No carotid bruits RESPIRATORY:  Clear to auscultation without rales, wheezing or rhonchi  ABDOMEN: Soft, non-tender, non-distended MUSCULOSKELETAL:  Ambulates independently SKIN: Warm and dry, no edema NEUROLOGIC:  Alert and oriented x 3. No focal neuro deficits noted. PSYCHIATRIC:  Normal affect    ASSESSMENT:    No diagnosis found. PLAN:     Cardiac risk counseling and prevention recommendations: -recommend heart healthy/Mediterranean diet, with whole grains, fruits, vegetable, fish, lean meats, nuts, and olive oil. Limit salt. -recommend moderate walking, 3-5 times/week for 30-50 minutes each session. Aim for at least 150 minutes.week. Goal should be pace of 3 miles/hours, or walking 1.5 miles in 30 minutes -recommend avoidance of tobacco products. Avoid excess alcohol. -ASCVD risk score: The 10-year ASCVD risk score (Arnett DK, et al., 2019) is: 16.9%   Values used to calculate the score:     Age: 67 years     Sex: Male     Is Non-Hispanic African American: No     Diabetic: No     Tobacco smoker: No     Systolic Blood Pressure: Q000111Q mmHg     Is BP treated: Yes     HDL Cholesterol: 58.6 mg/dL     Total Cholesterol: 182 mg/dL    Plan for follow up:  Buford Dresser, MD, PhD, [facility] at [facility] at [facility] [address], [place], [place] [address] [phone]   Medication Adjustments/Labs and Tests Ordered: Current medicines are reviewed at length with the patient today.  Concerns regarding medicines are outlined above.  No orders of the defined types were placed in this encounter.  No orders of the defined types were placed in this encounter.   There are no Patient Instructions on file for this visit.  Signed, Buford Dresser, MD PhD 05/23/2022 7:50 AM     [place]

## 2022-05-23 NOTE — Patient Instructions (Addendum)
Medication Instructions:  Your physician recommends that you continue on your current medications as directed. Please refer to the Current Medication list given to you today.  *If you need a refill on your cardiac medications before your next appointment, please call your pharmacy*  Lab Work: NONE  Testing/Procedures: NONE  Follow-Up: At [facility], you and your health needs are our priority.  As part of our continuing mission to provide you with exceptional heart care, we have created designated Provider Care Teams.  These Care Teams include your primary Cardiologist (physician) and Advanced Practice Providers (APPs -  Physician Assistants and Nurse Practitioners) who all work together to provide you with the care you need, when you need it.  We recommend signing up for the patient portal called "MyChart".  Sign up information is provided on this After Visit Summary.  MyChart is used to connect with patients for Virtual Visits (Telemedicine).  Patients are able to view lab/test results, encounter notes, upcoming appointments, etc.  Non-urgent messages can be sent to your provider as well.   To learn more about what you can do with MyChart, go to [email or web address].    Your next appointment:   3 month(s)  The format for your next appointment:   In Person  Provider:   Buford Dresser, MD    how to check blood pressure:  -sit comfortably in a chair, feet uncrossed and flat on floor, for 5-10 minutes  -arm ideally should rest at the level of the heart. However, arm should be relaxed and not tense (for example, do not hold the arm up unsupported)  -avoid exercise, caffeine, and tobacco for at least 30 minutes prior to BP reading  -don't take BP cuff reading over clothes (always place on skin directly)  -I prefer to know how well the medication is working, so I would like you to take your readings 1-2 hours after taking your blood pressure medication if possible   I  like the Omron arm cuffs. Avoid wrist cuffs if you can.  Send me some readings via mychart in a few weeks and we will make adjustments.

## 2022-05-24 ENCOUNTER — Encounter (HOSPITAL_BASED_OUTPATIENT_CLINIC_OR_DEPARTMENT_OTHER): Payer: Self-pay

## 2022-05-24 DIAGNOSIS — M4316 Spondylolisthesis, lumbar region: Secondary | ICD-10-CM | POA: Diagnosis not present

## 2022-05-31 ENCOUNTER — Other Ambulatory Visit: Payer: Self-pay | Admitting: Neurological Surgery

## 2022-05-31 DIAGNOSIS — M4316 Spondylolisthesis, lumbar region: Secondary | ICD-10-CM

## 2022-06-01 ENCOUNTER — Encounter: Payer: Self-pay | Admitting: Internal Medicine

## 2022-06-05 ENCOUNTER — Encounter: Payer: Self-pay | Admitting: Internal Medicine

## 2022-06-05 ENCOUNTER — Ambulatory Visit (INDEPENDENT_AMBULATORY_CARE_PROVIDER_SITE_OTHER): Payer: Medicare Other | Admitting: Internal Medicine

## 2022-06-05 VITALS — BP 142/84 | HR 96 | Temp 98.4°F | Ht 68.0 in | Wt 187.0 lb

## 2022-06-05 DIAGNOSIS — I1A0 Resistant hypertension: Secondary | ICD-10-CM

## 2022-06-05 DIAGNOSIS — J4540 Moderate persistent asthma, uncomplicated: Secondary | ICD-10-CM | POA: Diagnosis not present

## 2022-06-05 MED ORDER — AMLODIPINE-OLMESARTAN 10-40 MG PO TABS: 1.0000 | ORAL_TABLET | Freq: Every day | ORAL | 1 refills | Status: DC

## 2022-06-05 MED ORDER — AMLODIPINE BESYLATE 10 MG PO TABS: 10.0000 mg | ORAL_TABLET | Freq: Every day | ORAL | 1 refills | Status: DC

## 2022-06-05 MED ORDER — ALBUTEROL SULFATE HFA 108 (90 BASE) MCG/ACT IN AERS: 2.0000 | INHALATION_SPRAY | Freq: Four times a day (QID) | RESPIRATORY_TRACT | 3 refills | Status: DC | PRN

## 2022-06-05 NOTE — Patient Instructions (Signed)
Hypertension, Adult High blood pressure (hypertension) is when the force of blood pumping through the arteries is too strong. The arteries are the blood vessels that carry blood from the heart throughout the body. Hypertension forces the heart to work harder to pump blood and may cause arteries to become narrow or stiff. Untreated or uncontrolled hypertension can lead to a heart attack, heart failure, a stroke, kidney disease, and other problems. A blood pressure reading consists of a higher number over a lower number. Ideally, your blood pressure should be below 120/80. The first ("top") number is called the systolic pressure. It is a measure of the pressure in your arteries as your heart beats. The second ("bottom") number is called the diastolic pressure. It is a measure of the pressure in your arteries as the heart relaxes. What are the causes? The exact cause of this condition is not known. There are some conditions that result in high blood pressure. What increases the risk? Certain factors may make you more likely to develop high blood pressure. Some of these risk factors are under your control, including: Smoking. Not getting enough exercise or physical activity. Being overweight. Having too much fat, sugar, calories, or salt (sodium) in your diet. Drinking too much alcohol. Other risk factors include: Having a personal history of heart disease, diabetes, high cholesterol, or kidney disease. Stress. Having a family history of high blood pressure and high cholesterol. Having obstructive sleep apnea. Age. The risk increases with age. What are the signs or symptoms? High blood pressure may not cause symptoms. Very high blood pressure (hypertensive crisis) may cause: Headache. Fast or irregular heartbeats (palpitations). Shortness of breath. Nosebleed. Nausea and vomiting. Vision changes. Severe chest pain, dizziness, and seizures. How is this diagnosed? This condition is diagnosed by  measuring your blood pressure while you are seated, with your arm resting on a flat surface, your legs uncrossed, and your feet flat on the floor. The cuff of the blood pressure monitor will be placed directly against the skin of your upper arm at the level of your heart. Blood pressure should be measured at least twice using the same arm. Certain conditions can cause a difference in blood pressure between your right and left arms. If you have a high blood pressure reading during one visit or you have normal blood pressure with other risk factors, you may be asked to: Return on a different day to have your blood pressure checked again. Monitor your blood pressure at home for 1 week or longer. If you are diagnosed with hypertension, you may have other blood or imaging tests to help your health care provider understand your overall risk for other conditions. How is this treated? This condition is treated by making healthy lifestyle changes, such as eating healthy foods, exercising more, and reducing your alcohol intake. You may be referred for counseling on a healthy diet and physical activity. Your health care provider may prescribe medicine if lifestyle changes are not enough to get your blood pressure under control and if: Your systolic blood pressure is above 130. Your diastolic blood pressure is above 80. Your personal target blood pressure may vary depending on your medical conditions, your age, and other factors. Follow these instructions at home: Eating and drinking  Eat a diet that is high in fiber and potassium, and low in sodium, added sugar, and fat. An example of this eating plan is called the DASH diet. DASH stands for Dietary Approaches to Stop Hypertension. To eat this way: Eat   plenty of fresh fruits and vegetables. Try to fill one half of your plate at each meal with fruits and vegetables. Eat whole grains, such as whole-wheat pasta, brown rice, or whole-grain bread. Fill about one  fourth of your plate with whole grains. Eat or drink low-fat dairy products, such as skim milk or low-fat yogurt. Avoid fatty cuts of meat, processed or cured meats, and poultry with skin. Fill about one fourth of your plate with lean proteins, such as fish, chicken without skin, beans, eggs, or tofu. Avoid pre-made and processed foods. These tend to be higher in sodium, added sugar, and fat. Reduce your daily sodium intake. Many people with hypertension should eat less than 1,500 mg of sodium a day. Do not drink alcohol if: Your health care provider tells you not to drink. You are pregnant, may be pregnant, or are planning to become pregnant. If you drink alcohol: Limit how much you have to: 0-1 drink a day for women. 0-2 drinks a day for men. Know how much alcohol is in your drink. In the [place] one drink equals one 12 oz bottle of beer (355 mL), one 5 oz glass of wine (148 mL), or one 1 oz glass of hard liquor (44 mL). Lifestyle  Work with your health care provider to maintain a healthy body weight or to lose weight. Ask what an ideal weight is for you. Get at least 30 minutes of exercise that causes your heart to beat faster (aerobic exercise) most days of the week. Activities may include walking, swimming, or biking. Include exercise to strengthen your muscles (resistance exercise), such as Pilates or lifting weights, as part of your weekly exercise routine. Try to do these types of exercises for 30 minutes at least 3 days a week. Do not use any products that contain nicotine or tobacco. These products include cigarettes, chewing tobacco, and vaping devices, such as e-cigarettes. If you need help quitting, ask your health care provider. Monitor your blood pressure at home as told by your health care provider. Keep all follow-up visits. This is important. Medicines Take over-the-counter and prescription medicines only as told by your health care provider. Follow directions carefully. Blood  pressure medicines must be taken as prescribed. Do not skip doses of blood pressure medicine. Doing this puts you at risk for problems and can make the medicine less effective. Ask your health care provider about side effects or reactions to medicines that you should watch for. Contact a health care provider if you: Think you are having a reaction to a medicine you are taking. Have headaches that keep coming back (recurring). Feel dizzy. Have swelling in your ankles. Have trouble with your vision. Get help right away if you: Develop a severe headache or confusion. Have unusual weakness or numbness. Feel faint. Have severe pain in your chest or abdomen. Vomit repeatedly. Have trouble breathing. These symptoms may be an emergency. Get help right away. Call 911. Do not wait to see if the symptoms will go away. Do not drive yourself to the hospital. Summary Hypertension is when the force of blood pumping through your arteries is too strong. If this condition is not controlled, it may put you at risk for serious complications. Your personal target blood pressure may vary depending on your medical conditions, your age, and other factors. For most people, a normal blood pressure is less than 120/80. Hypertension is treated with lifestyle changes, medicines, or a combination of both. Lifestyle changes include losing weight, eating a healthy,   low-sodium diet, exercising more, and limiting alcohol. This information is not intended to replace advice given to you by your health care provider. Make sure you discuss any questions you have with your health care provider. Document Revised: 01/04/2021 Document Reviewed: 01/04/2021 Elsevier Patient Education  2023 [employer] Inc[employer]  

## 2022-06-05 NOTE — Progress Notes (Unsigned)
Subjective:  Patient ID: Edwin Brown, male    DOB: 22-Aug-1955  Age: 67 y.o. MRN: [id number]  CC: No chief complaint on file.   HPI TUNNEY LAMPRECHT presents for ***  Outpatient Medications Prior to Visit  Medication Sig Dispense Refill   Albuterol-Budesonide (AIRSUPRA) 90-80 MCG/ACT AERO Inhale 2 puffs into the lungs 4 (four) times daily as needed. 10.7 g 3   ALPRAZolam (XANAX) 0.25 MG tablet Take 1 tablet (0.25 mg total) by mouth at bedtime. 90 tablet 0   amLODipine (NORVASC) 5 MG tablet Take 1 tablet (5 mg total) by mouth daily. 90 tablet 0   Fluticasone-Umeclidin-Vilant (TRELEGY ELLIPTA) 100-62.5-25 MCG/ACT AEPB Inhale 1 puff into the lungs daily. 120 each 1   loratadine (CLARITIN) 10 MG tablet Take 10 mg by mouth daily as needed for allergies.     olmesartan (BENICAR) 20 MG tablet Take 1 tablet (20 mg total) by mouth daily. 90 tablet 0   rosuvastatin (CRESTOR) 10 MG tablet Take 1 tablet (10 mg total) by mouth daily. 90 tablet 1   Turmeric Curcumin 500 MG CAPS Take 1,000 mg by mouth daily.     No facility-administered medications prior to visit.    ROS Review of Systems  Objective:  There were no vitals taken for this visit.  BP Readings from Last 3 Encounters:  05/23/22 (!) 152/82  05/22/22 132/86  05/08/22 (!) 162/88    Wt Readings from Last 3 Encounters:  05/23/22 189 lb (85.7 kg)  05/22/22 189 lb (85.7 kg)  05/08/22 185 lb (83.9 kg)    Physical Exam  Lab Results  Component Value Date   WBC 5.0 02/01/2022   HGB 14.8 02/01/2022   HCT 43.6 02/01/2022   PLT 267.0 02/01/2022   GLUCOSE 98 05/08/2022   CHOL 182 05/08/2022   TRIG 161.0 (H) 05/08/2022   HDL 58.60 05/08/2022   LDLCALC 91 05/08/2022   ALT 17 02/01/2022   AST 19 02/01/2022   NA 136 05/08/2022   K 4.4 05/08/2022   CL 98 05/08/2022   CREATININE 0.96 05/08/2022   BUN 14 05/08/2022   CO2 31 05/08/2022   TSH 1.12 02/01/2022   PSA 2.68 08/01/2021    DG INJECT DIAG/THERA/INC NEEDLE/CATH/PLC  EPI/LUMB/SAC W/IMG  Result Date: 04/17/2022 CLINICAL DATA:  67 year old male with lumbosacral spondylosis without myelopathy. He presents for his third right L4-L5 epidural steroid injection. His first injection (03/17/2022) resulted in minimal relief, however his second injection on 03/31/2022 resulted in approximately 40% relief of symptoms. FLUOROSCOPY: Radiation Exposure Index (as provided by the fluoroscopic device): 2.2 mGy Kerma PROCEDURE: The procedure, risks, benefits, and alternatives were explained to the patient. Questions regarding the procedure were encouraged and answered. The patient understands and consents to the procedure. LUMBAR EPIDURAL INJECTION: An interlaminar approach was performed on right at L4-L5. The overlying skin was cleansed and anesthetized. A 20 gauge epidural needle was advanced using loss-of-resistance technique. DIAGNOSTIC EPIDURAL INJECTION: Injection of Isovue-M 200 shows a good epidural pattern with spread above and below the level of needle placement, primarily on the right no vascular opacification is seen. THERAPEUTIC EPIDURAL INJECTION: 80 mg of Depo-Medrol mixed with 2 mL 1% lidocaine were instilled. The procedure was well-tolerated, and the patient was discharged thirty minutes following the injection in good condition. COMPLICATIONS: None. IMPRESSION: Technically successful epidural injection on the right L4-L5 #3. Electronically Signed   By: Jacqulynn Cadet M.D.   On: 04/17/2022 10:25    Assessment & Plan:  There are  no diagnoses linked to this encounter.   Follow-up: No follow-ups on file.  Scarlette Calico, MD

## 2022-06-06 ENCOUNTER — Encounter (HOSPITAL_BASED_OUTPATIENT_CLINIC_OR_DEPARTMENT_OTHER): Payer: Self-pay | Admitting: Cardiology

## 2022-06-08 ENCOUNTER — Encounter: Payer: Self-pay | Admitting: Family Medicine

## 2022-06-19 NOTE — Progress Notes (Unsigned)
Assessment/Plan:   Tremor  -minimal seen on examination today  -No evidence of Parkinson's disease.  Reassurance provided on that end  -Discussed that he could have a component of essential tremor.  Discussed nature and pathophysiology.  Discussed possible treatments, but he is not really interested in those right now and I do not disagree.  -discussion with the patient regarding the complex effect that alcohol can have on tremor.  Chronic alcohol use can produce a tremor but discontinuation of the alcohol can also cause a tremulous state for quite some time.  We talked about recommendation and no more than 2 days/week.   B12 deficiency  -pt with evidence of B12 deficiency.  Discussed with the patient that neurologically, would like to see B12 levels greater than 400.  Would recommend oral B12 supplementation, 1000 mcg daily.  Follow-up as needed Subjective:   Edwin Brown was seen today in the movement disorders clinic for neurologic consultation at the request of Etta Grandchild, MD.  The consultation is for the evaluation of tremor.  Patient is a 67 year old with a history of hypertension, syncope, asthma who presents today to discuss tremor.  Tremor: Yes.     How long has it been going on? 3-4 years  At rest or with activation?  activation  When is it noted the most?  Holding a cup of coffee/holding cell phone  Fam hx of tremor?  Yes.  , GF with "palsy."  Located where?  Bilateral UE - he is R handed.  Both hands shake equally  Affected by caffeine:  No. (Drinks 2 cups/day)  Affected by alcohol:  No.  Affected by stress:  No.  Affected by fatigue:  No.  Spills soup if on spoon:  No.  Spills glass of liquid if full:  No.  Other Specific Symptoms:  Voice: no change Sleep:   Vivid Dreams:  Yes.    Acting out dreams:  Yes.  , has fallen OOB 3 times in last month but has "lashed out" at night since childhood Wet Pillows: No. Postural symptoms:  No.  Falls?  No. Bradykinesia  symptoms: no bradykinesia noted Loss of smell:  No. Loss of taste:  No. Urinary Incontinence:  No. Difficulty Swallowing:  No. Handwriting, micrographia: No. (Always been illegible since childhood) Depression:  Yes.  , Minor (he describes it as "manic depression" and says that citalopram helps) Memory changes:  "memory has never been good" N/V:  No. Lightheaded:  No.  Syncope: No.  Neuroimaging of the brain has not previously been performed in the recent years.  I  ALLERGIES:   Allergies  Allergen Reactions   Dilaudid [Hydromorphone Hcl] Shortness Of Breath   Phenergan [Promethazine Hcl] Other (See Comments)    Shaky, very out of sorts    Hydromorphone Hcl    Other     HORSE SERUM TETANUS ANTI TOX : PATIENT GETS HIGH FEVER   Promethazine Hcl     CURRENT MEDICATIONS:  Current Outpatient Medications  Medication Instructions   albuterol (VENTOLIN HFA) 108 (90 Base) MCG/ACT inhaler 2 puffs, Inhalation, Every 6 hours PRN   Albuterol-Budesonide (AIRSUPRA) 90-80 MCG/ACT AERO 2 puffs, Inhalation, 4 times daily PRN   ALPRAZolam (XANAX) 0.25 mg, Oral, Daily at bedtime   amLODipine-olmesartan (AZOR) 10-40 MG tablet 1 tablet, Oral, Daily   Fluticasone-Umeclidin-Vilant (TRELEGY ELLIPTA) 100-62.5-25 MCG/ACT AEPB 1 puff, Inhalation, Daily   glucosamine-chondroitin 500-400 MG tablet 1 tablet, Oral, 3 times daily   loratadine (CLARITIN) 10 mg, Oral, Daily  PRN   Methylsulfonylmethane (MSM) 1000 MG CAPS Oral   rosuvastatin (CRESTOR) 10 mg, Oral, Daily   Turmeric Curcumin 1,000 mg, Oral, Daily    Objective:   PHYSICAL EXAMINATION:    VITALS:   Vitals:   06/22/22 0936  BP: 112/70  Pulse: 74  Resp: 18  SpO2: 95%  Weight: 186 lb (84.4 kg)  Height: 5\' 8"  (1.727 m)    GEN:  The patient appears stated age and is in NAD. HEENT:  Normocephalic, atraumatic.  The mucous membranes are moist. The superficial temporal arteries are without ropiness or tenderness. CV:  RRR Lungs:   CTAB Neck/HEME:  There are no carotid bruits bilaterally.  Neurological examination:  Orientation: The patient is alert and oriented x3.  Cranial nerves: There is good facial symmetry.  Extraocular muscles are intact. The visual fields are full to confrontational testing. The speech is fluent and clear. Soft palate rises symmetrically and there is no tongue deviation. Hearing is intact to conversational tone. Sensation: Sensation is intact to light touch throughout (facial, trunk, extremities). Vibration is intact at the bilateral big toe. There is no extinction with double simultaneous stimulation.  Motor: Strength is 5/5 in the bilateral upper and lower extremities.   Shoulder shrug is equal and symmetric.  There is no pronator drift. Deep tendon reflexes: Deep tendon reflexes are 2/4 at the bilateral biceps, triceps, brachioradialis, patella and achilles. Plantar responses are downgoing bilaterally.  Movement examination: Tone: There is nl tone in the bilateral upper extremities.  The tone in the lower extremities is nl.  Abnormal movements: no rest tremor.  No postural tremor.  Min intention tremor.  he has no difficulty with archimedes spirals. he has no difficulty when asked to pour a full glass of water from one glass to another.  Coordination:  There is no  decremation with RAM's, with any form of RAMS, including alternating supination and pronation of the forearm, hand opening and closing, finger taps, heel taps and toe taps.  Gait and Station: The patient has no difficulty arising out of a deep-seated chair without the use of the hands. The patient's stride length is good.   I have reviewed and interpreted the following labs independently   Chemistry      Component Value Date/Time   NA 136 05/08/2022 1037   K 4.4 05/08/2022 1037   CL 98 05/08/2022 1037   CO2 31 05/08/2022 1037   BUN 14 05/08/2022 1037   CREATININE 0.96 05/08/2022 1037      Component Value Date/Time   CALCIUM  11.0 (H) 05/08/2022 1037   ALKPHOS 55 02/01/2022 1038   AST 19 02/01/2022 1038   ALT 17 02/01/2022 1038   BILITOT 0.4 02/01/2022 1038      Lab Results  Component Value Date   TSH 1.12 02/01/2022   Lab Results  Component Value Date   WBC 5.0 02/01/2022   HGB 14.8 02/01/2022   HCT 43.6 02/01/2022   MCV 89.0 02/01/2022   PLT 267.0 02/01/2022   Lab Results  Component Value Date   VITAMINB12 260 02/01/2022      Total time spent on today's visit was 38 minutes, including both face-to-face time and nonface-to-face time.  Time included that spent on review of records (prior notes available to me/labs/imaging if pertinent), discussing treatment and goals, answering patient's questions and coordinating care.  Cc:  Etta Grandchild, MD

## 2022-06-22 ENCOUNTER — Encounter: Payer: Self-pay | Admitting: Neurology

## 2022-06-22 ENCOUNTER — Ambulatory Visit: Payer: Medicare Other | Admitting: Neurology

## 2022-06-22 VITALS — BP 112/70 | HR 74 | Resp 18 | Ht 68.0 in | Wt 186.0 lb

## 2022-06-22 DIAGNOSIS — Z789 Other specified health status: Secondary | ICD-10-CM

## 2022-06-22 DIAGNOSIS — R251 Tremor, unspecified: Secondary | ICD-10-CM

## 2022-06-22 DIAGNOSIS — E538 Deficiency of other specified B group vitamins: Secondary | ICD-10-CM

## 2022-06-22 NOTE — Patient Instructions (Addendum)
You have been diagnosed with low B12.  We recommend that you take over the counter B12, 1000 micrograms daily.  In regards to your tremor, you decided to hold on medication for now.  This can be re-addressed in the future if needed.  We discussed that alcohol can also have a complex relationship with tremor and its generally recommended that patients limit intake to no more than 2 days per week.  It was very nice to meet you today!  The physicians and staff at [facility] are committed to providing excellent care. You may receive a survey requesting feedback about your experience at our office. We strive to receive "very good" responses to the survey questions. If you feel that your experience would prevent you from giving the office a "very good " response, please contact our office to try to remedy the situation. We may be reached at [phone]. Thank you for taking the time out of your busy day to complete the survey.

## 2022-07-06 ENCOUNTER — Institutional Professional Consult (permissible substitution): Payer: Medicare Other | Admitting: Pulmonary Disease

## 2022-07-14 NOTE — Progress Notes (Signed)
[facility] [address] [place] [address] Phone: [phone] Subjective:   Edwin Brown, am serving as a scribe for Dr. Antoine Primas.  I'm seeing this patient by the request  of:  Etta Grandchild, MD  CC: Back and neck pain follow-up  [id number]  Edwin Brown is a 67 y.o. male coming in with complaint of back and neck pain. OMT 05/22/2022. Referral to Dr. Jake Samples.  It was recommended that patient could have some facet injections or medial branch blocks and see if he would be a candidate for possible radiofrequency ablation.  Patient was going to wait on any other procedures since starting chair yoga classes.  Patient states that he has been doing well since last visit. Less pain now that he is no longer working.   Does have some L shoulder pain that is intermittent.   Also has pain in B piriformis when he goes for a walk with his dogs.   Medications patient has been prescribed: None  Taking:         Reviewed prior external information including notes and imaging from previsou exam, outside providers and external EMR if available.   As well as notes that were available from care everywhere and other healthcare systems.  Past medical history, social, surgical and family history all reviewed in electronic medical record.  No pertanent information unless stated regarding to the chief complaint.   Past Medical History:  Diagnosis Date   Asthma    Depression    HTN (hypertension)    Hyperlipidemia    Syncope     Allergies  Allergen Reactions   Dilaudid [Hydromorphone Hcl] Shortness Of Breath   Phenergan [Promethazine Hcl] Other (See Comments)    Shaky, very out of sorts    Hydromorphone Hcl    Other     HORSE SERUM TETANUS ANTI TOX : PATIENT GETS HIGH FEVER   Promethazine Hcl      Review of Systems:  No headache, visual changes, nausea, vomiting, diarrhea, constipation, dizziness, abdominal pain, skin rash, fevers,  chills, night sweats, weight loss, swollen lymph nodes, body aches, joint swelling, chest pain, shortness of breath, mood changes. POSITIVE muscle aches  Objective  Blood pressure 132/84, pulse 96, height 5\' 8"  (1.727 m), weight 184 lb (83.5 kg), SpO2 99 %.   General: No apparent distress alert and oriented x3 mood and affect normal, dressed appropriately.  HEENT: Pupils equal, extraocular movements intact  Respiratory: Patient's speak in full sentences and does not appear short of breath  Cardiovascular: No lower extremity edema, non tender, no erythema   Exam does have some loss of lordosis noted.  Some tenderness to palpation noted.  Low back still has tightness with straight leg test.  Neck exam does have some limited sidebending bilaterally.  Osteopathic findings  C2 flexed rotated and side bent right C6 flexed rotated and side bent left T3 extended rotated and side bent right inhaled rib     Assessment and Plan:  Degenerative disc disease, lumbar Significant arthritic changes as well as facet arthropathy of the lower back.  Will continue to monitor.  Patient thinks that doing the conservative therapy including yoga has been the most beneficial for this individual at the moment.  Discussed icing regimen and home exercises, discussed which activities to do and which ones to avoid.  Patient responds well to osteopathic manipulation on other possibilities.  Follow-up again in 6 to 8 weeks    Nonallopathic problems  Decision today to treat with OMT was based on Physical Exam  After verbal consent patient was treated with HVLA, ME, FPR techniques in cervical, rib, thoracic  areas  Patient tolerated the procedure well with improvement in symptoms  Patient given exercises, stretches and lifestyle modifications  See medications in patient instructions if given  Patient will follow up in 4-8 weeks    The above documentation has been reviewed and is accurate and complete Judi Saa, DO          Note: This dictation was prepared with Dragon dictation along with smaller phrase technology. Any transcriptional errors that result from this process are unintentional.

## 2022-07-17 ENCOUNTER — Encounter: Payer: Self-pay | Admitting: Family Medicine

## 2022-07-17 ENCOUNTER — Ambulatory Visit: Payer: Medicare Other | Admitting: Family Medicine

## 2022-07-17 VITALS — BP 132/84 | HR 96 | Ht 68.0 in | Wt 184.0 lb

## 2022-07-17 DIAGNOSIS — M9901 Segmental and somatic dysfunction of cervical region: Secondary | ICD-10-CM | POA: Diagnosis not present

## 2022-07-17 DIAGNOSIS — M9908 Segmental and somatic dysfunction of rib cage: Secondary | ICD-10-CM | POA: Diagnosis not present

## 2022-07-17 DIAGNOSIS — M5136 Other intervertebral disc degeneration, lumbar region: Secondary | ICD-10-CM

## 2022-07-17 DIAGNOSIS — M9902 Segmental and somatic dysfunction of thoracic region: Secondary | ICD-10-CM

## 2022-07-17 NOTE — Patient Instructions (Signed)
Good to see you! Thanks for music suggestions Glad you're feeling better See you again in 2-3 months

## 2022-07-17 NOTE — Assessment & Plan Note (Signed)
Significant arthritic changes as well as facet arthropathy of the lower back.  Will continue to monitor.  Patient thinks that doing the conservative therapy including yoga has been the most beneficial for this individual at the moment.  Discussed icing regimen and home exercises, discussed which activities to do and which ones to avoid.  Patient responds well to osteopathic manipulation on other possibilities.  Follow-up again in 6 to 8 weeks

## 2022-07-19 ENCOUNTER — Encounter: Payer: Self-pay | Admitting: Pulmonary Disease

## 2022-07-19 ENCOUNTER — Ambulatory Visit: Payer: Medicare Other | Admitting: Pulmonary Disease

## 2022-07-19 VITALS — BP 140/80 | HR 98 | Ht 68.0 in | Wt 182.4 lb

## 2022-07-19 DIAGNOSIS — T7840XD Allergy, unspecified, subsequent encounter: Secondary | ICD-10-CM | POA: Diagnosis not present

## 2022-07-19 DIAGNOSIS — J4540 Moderate persistent asthma, uncomplicated: Secondary | ICD-10-CM | POA: Diagnosis not present

## 2022-07-19 LAB — CBC WITH DIFFERENTIAL/PLATELET
Basophils Absolute: 0.1 10*3/uL (ref 0.0–0.1)
Basophils Relative: 2 % (ref 0.0–3.0)
Eosinophils Absolute: 0.2 10*3/uL (ref 0.0–0.7)
Eosinophils Relative: 3.5 % (ref 0.0–5.0)
HCT: 40.5 % (ref 39.0–52.0)
Hemoglobin: 13.8 g/dL (ref 13.0–17.0)
Lymphocytes Relative: 34.7 % (ref 12.0–46.0)
Lymphs Abs: 1.6 10*3/uL (ref 0.7–4.0)
MCHC: 34.2 g/dL (ref 30.0–36.0)
MCV: 88.9 fl (ref 78.0–100.0)
Monocytes Absolute: 0.4 10*3/uL (ref 0.1–1.0)
Monocytes Relative: 8.6 % (ref 3.0–12.0)
Neutro Abs: 2.3 10*3/uL (ref 1.4–7.7)
Neutrophils Relative %: 51.2 % (ref 43.0–77.0)
Platelets: 305 10*3/uL (ref 150.0–400.0)
RBC: 4.56 Mil/uL (ref 4.22–5.81)
RDW: 13 % (ref 11.5–15.5)
WBC: 4.6 10*3/uL (ref 4.0–10.5)

## 2022-07-19 MED ORDER — MONTELUKAST SODIUM 10 MG PO TABS: 10.0000 mg | ORAL_TABLET | Freq: Every day | ORAL | 11 refills | Status: DC

## 2022-07-19 NOTE — Progress Notes (Signed)
Synopsis: Referred in May 2024 for asthma by Jodelle Red,*  Subjective:   PATIENT ID: Edwin Brown GENDER: male DOB: 01-14-1956, MRN: [id number]  Chief Complaint  Patient presents with   Consult    Cough, wheezing, throat clearing.    This is a 67 year old gentleman past medical history of asthma, depression, hypertension, hyperlipidemia.  Patient has a longstanding history of asthma.  Currently on Trelegy 100.  He does feel like his symptoms is better managed.  At one time he was having weekly nocturnal symptoms.  He had this entire life.  He have not had as a child.  He worked as a Optometrist for many years.  He does notice that he has worse symptoms after eating fried foods.  No other significant reflux symptoms that he has noticed.  He is not on Singulair.  But he does have a history of sinus issues and symptoms.  He has had nasal sinus surgery in the past with Dr. Annalee Genta.    Past Medical History:  Diagnosis Date   Asthma    Depression    HTN (hypertension)    Hyperlipidemia    Syncope      Family History  Problem Relation Age of Onset   Heart attack Father    Heart attack Brother 40     Past Surgical History:  Procedure Laterality Date   APPENDECTOMY     CHOLECYSTECTOMY N/A 01/30/2014   Procedure: LAPAROSCOPIC CHOLECYSTECTOMY WITH INTRAOPERATIVE CHOLANGIOGRAM;  Surgeon: Valarie Merino, MD;  Location: [facility];  Service: General;  Laterality: N/A;   NASAL SINUS SURGERY     scaphoidectomy     VASECTOMY      Social History   Socioeconomic History   Marital status: Married    Spouse name: Not on file   Number of children: 3   Years of education: 15   Highest education level: Not on file  Occupational History   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  Tobacco Use   Smoking status: Never   Smokeless tobacco: Never  Vaping Use   Vaping Use: Never used  Substance and Sexual Activity   Alcohol use: Yes    Alcohol/week: 18.0  standard drinks of alcohol    Types: 8 Glasses of wine, 10 Cans of beer per week    Comment: 2/day   Drug use: No   Sexual activity: Yes    Partners: Female  Other Topics Concern   Not on file  Social History Narrative   Right handed   Drinks caffeine   Two story home   Retired nicely   Lives in home with wife   Social Determinants of Corporate investment banker Strain: Not on file  Food Insecurity: Not on file  Transportation Needs: Not on file  Physical Activity: Not on file  Stress: Not on file  Social Connections: Not on file  Intimate Partner Violence: Not on file     Allergies  Allergen Reactions   Dilaudid [Hydromorphone Hcl] Shortness Of Breath   Phenergan [Promethazine Hcl] Other (See Comments)    Shaky, very out of sorts    Hydromorphone Hcl    Other     HORSE SERUM TETANUS ANTI TOX : PATIENT GETS HIGH FEVER   Promethazine Hcl      Outpatient Medications Prior to Visit  Medication Sig Dispense Refill   albuterol (VENTOLIN HFA) 108 (90 Base) MCG/ACT inhaler Inhale 2 puffs into the lungs every 6 (six) hours  as needed for wheezing or shortness of breath. 8 g 3   Albuterol-Budesonide (AIRSUPRA) 90-80 MCG/ACT AERO Inhale 2 puffs into the lungs 4 (four) times daily as needed. 10.7 g 3   ALPRAZolam (XANAX) 0.25 MG tablet Take 1 tablet (0.25 mg total) by mouth at bedtime. 90 tablet 0   amLODipine-olmesartan (AZOR) 10-40 MG tablet Take 1 tablet by mouth daily. 90 tablet 1   Fluticasone-Umeclidin-Vilant (TRELEGY ELLIPTA) 100-62.5-25 MCG/ACT AEPB Inhale 1 puff into the lungs daily. 120 each 1   glucosamine-chondroitin 500-400 MG tablet Take 1 tablet by mouth 3 (three) times daily.     loratadine (CLARITIN) 10 MG tablet Take 10 mg by mouth daily as needed for allergies.     Methylsulfonylmethane (MSM) 1000 MG CAPS Take by mouth.     rosuvastatin (CRESTOR) 10 MG tablet Take 1 tablet (10 mg total) by mouth daily. 90 tablet 1   Turmeric Curcumin 500 MG CAPS Take 1,000 mg by  mouth daily.     No facility-administered medications prior to visit.    Review of Systems  Constitutional:  Negative for chills, fever, malaise/fatigue and weight loss.  HENT:  Negative for hearing loss, sore throat and tinnitus.   Eyes:  Negative for blurred vision and double vision.  Respiratory:  Positive for shortness of breath. Negative for cough, hemoptysis, sputum production, wheezing and stridor.   Cardiovascular:  Negative for chest pain, palpitations, orthopnea, leg swelling and PND.  Gastrointestinal:  Negative for abdominal pain, constipation, diarrhea, heartburn, nausea and vomiting.  Genitourinary:  Negative for dysuria, hematuria and urgency.  Musculoskeletal:  Negative for joint pain and myalgias.  Skin:  Negative for itching and rash.  Neurological:  Negative for dizziness, tingling, weakness and headaches.  Endo/Heme/Allergies:  Negative for environmental allergies. Does not bruise/bleed easily.  Psychiatric/Behavioral:  Negative for depression. The patient is not nervous/anxious and does not have insomnia.   All other systems reviewed and are negative.    Objective:  Physical Exam Vitals reviewed.  Constitutional:      General: He is not in acute distress.    Appearance: He is well-developed.  HENT:     Head: Normocephalic and atraumatic.  Eyes:     General: No scleral icterus.    Conjunctiva/sclera: Conjunctivae normal.     Pupils: Pupils are equal, round, and reactive to light.  Neck:     Vascular: No JVD.     Trachea: No tracheal deviation.  Cardiovascular:     Rate and Rhythm: Normal rate and regular rhythm.     Heart sounds: Normal heart sounds. No murmur heard. Pulmonary:     Effort: Pulmonary effort is normal. No tachypnea, accessory muscle usage or respiratory distress.     Breath sounds: No stridor. No wheezing, rhonchi or rales.  Abdominal:     General: There is no distension.     Palpations: Abdomen is soft.     Tenderness: There is no  abdominal tenderness.  Musculoskeletal:        General: No tenderness.     Cervical back: Neck supple.  Lymphadenopathy:     Cervical: No cervical adenopathy.  Skin:    General: Skin is warm and dry.     Capillary Refill: Capillary refill takes less than 2 seconds.     Findings: No rash.  Neurological:     Mental Status: He is alert and oriented to person, place, and time.  Psychiatric:        Behavior: Behavior normal.  Vitals:   07/19/22 0935  BP: (!) 140/80  Pulse: 98  SpO2: 98%  Weight: 182 lb 6.4 oz (82.7 kg)  Height: 5\' 8"  (1.727 m)   98% on RA BMI Readings from Last 3 Encounters:  07/19/22 27.73 kg/m  07/17/22 27.98 kg/m  06/22/22 28.28 kg/m   Wt Readings from Last 3 Encounters:  07/19/22 182 lb 6.4 oz (82.7 kg)  07/17/22 184 lb (83.5 kg)  06/22/22 186 lb (84.4 kg)     CBC    Component Value Date/Time   WBC 5.0 02/01/2022 1038   RBC 4.90 02/01/2022 1038   HGB 14.8 02/01/2022 1038   HCT 43.6 02/01/2022 1038   PLT 267.0 02/01/2022 1038   MCV 89.0 02/01/2022 1038   MCH 29.7 01/31/2014 0550   MCHC 33.9 02/01/2022 1038   RDW 13.4 02/01/2022 1038   LYMPHSABS 1.6 02/01/2022 1038   MONOABS 0.5 02/01/2022 1038   EOSABS 0.2 02/01/2022 1038   BASOSABS 0.1 02/01/2022 1038     Chest Imaging:  No recent chest imaging  Pulmonary Functions Testing Results:     No data to display          FeNO:   Pathology:   Echocardiogram:   Heart Catheterization:     Assessment & Plan:     ICD-10-CM   1. Moderate persistent asthma without complication  J45.40 REGIONAL PANEL 2    IgE    CBC w/Diff    Pulmonary Function Test    2. Allergy, subsequent encounter  T78.40XD       Discussion:  This is a 67 year old gentleman, currently uses Trelegy also has as needed air supra.  Still with ongoing symptomatology.  Usually happens once or twice a month.  Occasional chest tightness and wheezing.  He has less nocturnal symptoms after switching to  Trelegy.  Plan: He has moderate persistent symptoms despite adequate inhaler regimen. Working to check his CBC with differential, regional allergy panel, IgE. Also get pulmonary function tests. Based on these findings we can discuss whether or not he may be beneficial to consider a biologic treatment for his asthma. Patient is agreeable to this plan. Return to clinic after PFTs complete see me or NP.    Current Outpatient Medications:    albuterol (VENTOLIN HFA) 108 (90 Base) MCG/ACT inhaler, Inhale 2 puffs into the lungs every 6 (six) hours as needed for wheezing or shortness of breath., Disp: 8 g, Rfl: 3   Albuterol-Budesonide (AIRSUPRA) 90-80 MCG/ACT AERO, Inhale 2 puffs into the lungs 4 (four) times daily as needed., Disp: 10.7 g, Rfl: 3   ALPRAZolam (XANAX) 0.25 MG tablet, Take 1 tablet (0.25 mg total) by mouth at bedtime., Disp: 90 tablet, Rfl: 0   amLODipine-olmesartan (AZOR) 10-40 MG tablet, Take 1 tablet by mouth daily., Disp: 90 tablet, Rfl: 1   Fluticasone-Umeclidin-Vilant (TRELEGY ELLIPTA) 100-62.5-25 MCG/ACT AEPB, Inhale 1 puff into the lungs daily., Disp: 120 each, Rfl: 1   glucosamine-chondroitin 500-400 MG tablet, Take 1 tablet by mouth 3 (three) times daily., Disp: , Rfl:    loratadine (CLARITIN) 10 MG tablet, Take 10 mg by mouth daily as needed for allergies., Disp: , Rfl:    Methylsulfonylmethane (MSM) 1000 MG CAPS, Take by mouth., Disp: , Rfl:    montelukast (SINGULAIR) 10 MG tablet, Take 1 tablet (10 mg total) by mouth at bedtime., Disp: 30 tablet, Rfl: 11   rosuvastatin (CRESTOR) 10 MG tablet, Take 1 tablet (10 mg total) by mouth daily., Disp: 90 tablet, Rfl: 1  Turmeric Curcumin 500 MG CAPS, Take 1,000 mg by mouth daily., Disp: , Rfl:    Josephine Igo, DO [facility] 07/19/2022 10:01 AM

## 2022-07-19 NOTE — Patient Instructions (Signed)
Thank you for visiting Dr. Tonia Brooms at [facility]. Today we recommend the following:  Orders Placed This Encounter  Procedures   REGIONAL PANEL 2   IgE   CBC w/Diff   Pulmonary Function Test   Return in about 6 weeks (around 08/30/2022) for with APP or Dr. Tonia Brooms, after PFTs.    Please do your part to reduce the spread of COVID-19.

## 2022-07-20 LAB — IGE: IgE (Immunoglobulin E), Serum: 237 kU/L — ABNORMAL HIGH (ref <=114)

## 2022-07-24 LAB — REGIONAL PANEL 2
012-IgE Goldenrod: <0.1 kU/L
Amer Sycamore IgE Qn: <0.1 kU/L
Bahia Grass IgE: <0.1 kU/L
Bermuda Grass IgE: <0.1 kU/L
Cocklebur IgE: <0.1 kU/L
Cottonwood IgE: <0.1 kU/L
Elm, American IgE: <0.1 kU/L
Hickory, White IgE: <0.1 kU/L
Johnson Grass IgE: <0.1 kU/L
Kentucky Bluegrass IgE: <0.1 kU/L
Lamb's Quarters IgE: <0.1 kU/L
Maple/Box Elder IgE: <0.1 kU/L
Oak, White IgE: <0.1 kU/L
Pigweed, Rough IgE: <0.1 kU/L
Plantain, English IgE: <0.1 kU/L
Ragweed, Short IgE: <0.1 kU/L
T005-IgE Beech (American): <0.1 kU/L
T010-IgE Walnut: <0.1 kU/L
T012-IgE Willow: <0.1 kU/L
T015-IgE Ash, White: <0.1 kU/L
W004-IgE Ragweed, False: <0.1 kU/L

## 2022-08-02 NOTE — Progress Notes (Signed)
Mr. Mathenia, you have an elevated IgE and elevated serum EOS count consistent with your asthma diagnosis. We can discuss this further regarding management strategies at your next office visit   Thanks,  BL[facility]  Josephine Igo, DO  Pulmonary Critical Care 08/02/2022 3:47 PM

## 2022-08-10 ENCOUNTER — Other Ambulatory Visit: Payer: Self-pay | Admitting: Internal Medicine

## 2022-08-10 DIAGNOSIS — F514 Sleep terrors [night terrors]: Secondary | ICD-10-CM

## 2022-08-14 ENCOUNTER — Other Ambulatory Visit: Payer: Self-pay | Admitting: Internal Medicine

## 2022-08-14 DIAGNOSIS — J4540 Moderate persistent asthma, uncomplicated: Secondary | ICD-10-CM

## 2022-08-31 ENCOUNTER — Ambulatory Visit (HOSPITAL_BASED_OUTPATIENT_CLINIC_OR_DEPARTMENT_OTHER): Payer: Medicare Other | Admitting: Cardiology

## 2022-08-31 ENCOUNTER — Encounter (HOSPITAL_BASED_OUTPATIENT_CLINIC_OR_DEPARTMENT_OTHER): Payer: Self-pay | Admitting: Cardiology

## 2022-08-31 VITALS — BP 136/80 | HR 82 | Ht 68.0 in | Wt 191.0 lb

## 2022-08-31 DIAGNOSIS — I10 Essential (primary) hypertension: Secondary | ICD-10-CM | POA: Diagnosis not present

## 2022-08-31 DIAGNOSIS — Z8249 Family history of ischemic heart disease and other diseases of the circulatory system: Secondary | ICD-10-CM

## 2022-08-31 DIAGNOSIS — I444 Left anterior fascicular block: Secondary | ICD-10-CM

## 2022-08-31 DIAGNOSIS — Z9189 Other specified personal risk factors, not elsewhere classified: Secondary | ICD-10-CM | POA: Diagnosis not present

## 2022-08-31 DIAGNOSIS — Z7189 Other specified counseling: Secondary | ICD-10-CM

## 2022-08-31 NOTE — Progress Notes (Signed)
Cardiology Office Note:  .   Date:  08/31/2022  ID:  Edwin Brown, DOB Dec 15, 1955, MRN [id number] PCP: Etta Grandchild, MD  [facility] Providers Cardiologist:  Jodelle Red, MD {  History of Present Illness: .   Edwin Brown is a 67 y.o. male with PMH asthma, hypertension, LAFB seen for follow up today.   Today: Working with Dr. Tonia Brooms with his breathing. Making gradual improvements, significantly less short of breath at today's visit.  Blood pressures at home have been very well controlled, 130s/80s. Rare 140s. Rushed into visit today, initial reading 163/83. Improved on recheck.  Discussed activity level, staying engaged now that he is retired.  ROS: Denies chest pain, shortness of breath at rest or with normal exertion. No PND, orthopnea, LE edema or unexpected weight gain. No syncope or palpitations. ROS otherwise negative except as noted.   Studies Reviewed: Marland Kitchen    EKG:     not ordered today  Physical Exam:   VS:  BP (!) 163/83   Pulse 82   Ht 5\' 8"  (1.727 m)   Wt 191 lb (86.6 kg)   BMI 29.04 kg/m    Wt Readings from Last 3 Encounters:  08/31/22 191 lb (86.6 kg)  07/19/22 182 lb 6.4 oz (82.7 kg)  07/17/22 184 lb (83.5 kg)    GEN: Well nourished, well developed in no acute distress HEENT: Normal, moist mucous membranes NECK: No JVD CARDIAC: regular rhythm, normal S1 and S2, no rubs or gallops. No murmur. VASCULAR: Radial and DP pulses 2+ bilaterally. No carotid bruits RESPIRATORY:  Clear to auscultation without rales, wheezing or rhonchi  ABDOMEN: Soft, non-tender, non-distended MUSCULOSKELETAL:  Ambulates independently SKIN: Warm and dry, no edema NEUROLOGIC:  Alert and oriented x 3. No focal neuro deficits noted. PSYCHIATRIC:  Normal affect    ASSESSMENT AND PLAN: .   Hypertension -reviewed home BP readings -continue amlodipine, olmesartan -avoid beta blockers for now given wheezing -had hypercalcemia on thiazide. -work on increasing  activity as tolerated -if BP consistently >140 at home, he will let me know and we will add to his regimen   Shortness of breath, wheezing Moderate persistent asthma -now established with Dr. Tonia Brooms   LAFB -low risk, no further evaluation needed   Elevated ASCVD risk Family history of heart disease -on rosuvastatin -FH: father died of MI age 69.   CV risk counseling and prevention -recommend heart healthy/Mediterranean diet, with whole grains, fruits, vegetable, fish, lean meats, nuts, and olive oil. Limit salt. -recommend moderate walking, 3-5 times/week for 30-50 minutes each session. Aim for at least 150 minutes.week. Goal should be pace of 3 miles/hours, or walking 1.5 miles in 30 minutes -recommend avoidance of tobacco products. Avoid excess alcohol. -ASCVD risk score: The 10-year ASCVD risk score (Arnett DK, et al., 2019) is: 23.7%   Values used to calculate the score:     Age: 51 years     Sex: Male     Is Non-Hispanic African American: No     Diabetic: No     Tobacco smoker: No     Systolic Blood Pressure: 163 mmHg     Is BP treated: Yes     HDL Cholesterol: 58.6 mg/dL     Total Cholesterol: 182 mg/dL    Dispo: 1 year or sooner as needed  Signed, Jodelle Red, MD   Jodelle Red, MD, PhD, [facility]  [facility]  [place]  [facility] at [facility]  [facility] [address] [place], [place] [address] [phone]

## 2022-08-31 NOTE — Patient Instructions (Signed)
Medication Instructions:  Continue current medications  *If you need a refill on your cardiac medications before your next appointment, please call your pharmacy*   Lab Work: None Ordered   Testing/Procedures: None Ordered   Follow-Up: At [facility], you and your health needs are our priority.  As part of our continuing mission to provide you with exceptional heart care, we have created designated Provider Care Teams.  These Care Teams include your primary Cardiologist (physician) and Advanced Practice Providers (APPs -  Physician Assistants and Nurse Practitioners) who all work together to provide you with the care you need, when you need it.  We recommend signing up for the patient portal called "MyChart".  Sign up information is provided on this After Visit Summary.  MyChart is used to connect with patients for Virtual Visits (Telemedicine).  Patients are able to view lab/test results, encounter notes, upcoming appointments, etc.  Non-urgent messages can be sent to your provider as well.   To learn more about what you can do with MyChart, go to [email or web address].    Your next appointment:   1 year(s)  Provider:   Bridgette Christopher, MD    Other Instructions   

## 2022-09-07 ENCOUNTER — Ambulatory Visit (INDEPENDENT_AMBULATORY_CARE_PROVIDER_SITE_OTHER): Payer: Medicare Other

## 2022-09-07 VITALS — Ht 68.0 in | Wt 191.0 lb

## 2022-09-07 DIAGNOSIS — Z0000 Encounter for general adult medical examination without abnormal findings: Secondary | ICD-10-CM | POA: Diagnosis not present

## 2022-09-07 DIAGNOSIS — Z86010 Personal history of colonic polyps: Secondary | ICD-10-CM | POA: Diagnosis not present

## 2022-09-07 DIAGNOSIS — K5730 Diverticulosis of large intestine without perforation or abscess without bleeding: Secondary | ICD-10-CM | POA: Diagnosis not present

## 2022-09-07 DIAGNOSIS — Z1211 Encounter for screening for malignant neoplasm of colon: Secondary | ICD-10-CM | POA: Diagnosis not present

## 2022-09-07 DIAGNOSIS — Z800 Family history of malignant neoplasm of digestive organs: Secondary | ICD-10-CM | POA: Diagnosis not present

## 2022-09-07 NOTE — Progress Notes (Signed)
Subjective:   Edwin Brown is a 67 y.o. male who presents for Medicare Annual/Subsequent preventive examination.  Visit Complete: Virtual  I connected with  Edwin Brown on 09/07/22 by a audio enabled telemedicine application and verified that I am speaking with the correct person using two identifiers.  Patient Location: Home  Provider Location: Office/Clinic  I discussed the limitations of evaluation and management by telemedicine. The patient expressed understanding and agreed to proceed.  Review of Systems     Cardiac Risk Factors include: advanced age (>59men, >58 women);dyslipidemia;hypertension;family history of premature cardiovascular disease;male gender;sedentary lifestyle     Objective:    Today's Vitals   09/07/22 1117  Weight: 191 lb (86.6 kg)  Height: 5\' 8"  (1.727 m)  PainSc: 0-No pain   Body mass index is 29.04 kg/m.     09/07/2022   11:34 AM 06/22/2022    9:42 AM 01/29/2014    2:21 PM 01/29/2014   10:36 AM  Advanced Directives  Does Patient Have a Medical Advance Directive? Yes No No No  Type of Estate agent of [place];Living will     Copy of Healthcare Power of Attorney in Chart? No - copy requested     Would patient like information on creating a medical advance directive?  No - Patient declined No - patient declined information No - patient declined information    Current Medications (verified) Outpatient Encounter Medications as of 09/07/2022  Medication Sig   albuterol (VENTOLIN HFA) 108 (90 Base) MCG/ACT inhaler Inhale 2 puffs into the lungs every 6 (six) hours as needed for wheezing or shortness of breath.   ALPRAZolam (XANAX) 0.25 MG tablet TAKE 1 TABLET BY MOUTH AT BEDTIME   amLODipine-olmesartan (AZOR) 10-40 MG tablet Take 1 tablet by mouth daily.   glucosamine-chondroitin 500-400 MG tablet Take 1 tablet by mouth 3 (three) times daily.   loratadine (CLARITIN) 10 MG tablet Take 10 mg by mouth daily as needed for  allergies.   Methylsulfonylmethane (MSM) 1000 MG CAPS Take by mouth.   montelukast (SINGULAIR) 10 MG tablet Take 1 tablet (10 mg total) by mouth at bedtime.   rosuvastatin (CRESTOR) 10 MG tablet Take 1 tablet (10 mg total) by mouth daily.   TRELEGY ELLIPTA 100-62.5-25 MCG/ACT AEPB INHALE 1 PUFF BY MOUTH INTO THE LUNGS ONCE DAILY   Turmeric Curcumin 500 MG CAPS Take 1,000 mg by mouth daily.   No facility-administered encounter medications on file as of 09/07/2022.    Allergies (verified) Dilaudid [hydromorphone hcl], Phenergan [promethazine hcl], Hydromorphone hcl, Other, and Promethazine hcl   History: Past Medical History:  Diagnosis Date   Asthma    Depression    HTN (hypertension)    Hyperlipidemia    Syncope    Past Surgical History:  Procedure Laterality Date   APPENDECTOMY     CHOLECYSTECTOMY N/A 01/30/2014   Procedure: LAPAROSCOPIC CHOLECYSTECTOMY WITH INTRAOPERATIVE CHOLANGIOGRAM;  Surgeon: Valarie Merino, MD;  Location: [facility];  Service: General;  Laterality: N/A;   NASAL SINUS SURGERY     scaphoidectomy     VASECTOMY     Family History  Problem Relation Age of Onset   Heart attack Father    Heart attack Brother 59   Social History   Socioeconomic History   Marital status: Married    Spouse name: Not on file   Number of children: 3   Years of education: 15   Highest education level: Not on file  Occupational History   Occupation:  farrier   Occupation: retired    Comment: Market researcher  Tobacco Use   Smoking status: Never   Smokeless tobacco: Never  Vaping Use   Vaping Use: Never used  Substance and Sexual Activity   Alcohol use: Yes    Alcohol/week: 18.0 standard drinks of alcohol    Types: 8 Glasses of wine, 10 Cans of beer per week    Comment: 2/day   Drug use: No   Sexual activity: Yes    Partners: Female  Other Topics Concern   Not on file  Social History Narrative   Right handed   Drinks caffeine   Two story home   Retired nicely   Lives in  home with wife   Social Determinants of Health   Financial Resource Strain: Low Risk  (09/07/2022)   Overall Financial Resource Strain (CARDIA)    Difficulty of Paying Living Expenses: Not hard at all  Food Insecurity: No Food Insecurity (09/07/2022)   Hunger Vital Sign    Worried About Running Out of Food in the Last Year: Never true    Ran Out of Food in the Last Year: Never true  Transportation Needs: No Transportation Needs (09/07/2022)   PRAPARE - Administrator, Civil Service (Medical): No    Lack of Transportation (Non-Medical): No  Physical Activity: Patient Declined (09/07/2022)   Exercise Vital Sign    Days of Exercise per Week: Patient declined    Minutes of Exercise per Session: Patient declined  Stress: No Stress Concern Present (09/07/2022)   [employer] of Occupational Health - Occupational Stress Questionnaire    Feeling of Stress : Only a little  Social Connections: Unknown (09/07/2022)   Social Connection and Isolation Panel [NHANES]    Frequency of Communication with Friends and Family: More than three times a week    Frequency of Social Gatherings with Friends and Family: More than three times a week    Attends Religious Services: Not on Marketing executive or Organizations: Yes    Attends Engineer, structural: More than 4 times per year    Marital Status: Married    Tobacco Counseling Counseling given: Not Answered   Clinical Intake:  Pre-visit preparation completed: Yes  Pain : No/denies pain Pain Score: 0-No pain     BMI - recorded: 29.04 Nutritional Status: BMI 25 -29 Overweight Nutritional Risks: None Diabetes: No  How often do you need to have someone help you when you read instructions, pamphlets, or other written materials from your doctor or pharmacy?: 1 - Never What is the last grade level you completed in school?: 3 years of college  Interpreter Needed?: No  Information entered by :: Edita Weyenberg N.  Collin Hendley, LPN.   Activities of Daily Living    09/07/2022   11:23 AM  In your present state of health, do you have any difficulty performing the following activities:  Hearing? 1  Vision? 0  Difficulty concentrating or making decisions? 0  Walking or climbing stairs? 0  Dressing or bathing? 0  Doing errands, shopping? 0  Preparing Food and eating ? N  Using the Toilet? N  In the past six months, have you accidently leaked urine? N  Do you have problems with loss of bowel control? N  Managing your Medications? N  Managing your Finances? N  Housekeeping or managing your Housekeeping? N    Patient Care Team: Etta Grandchild, MD as PCP - General (Internal Medicine) Jodelle Red,  MD as PCP - Cardiology (Cardiology) Tat, Octaviano Batty, DO as Consulting Physician (Neurology)  Indicate any recent Medical Services you may have received from other than [facility] providers in the past year (date may be approximate).     Assessment:   This is a routine wellness examination for [employer].  Hearing/Vision screen Hearing Screening - Comments:: Patient has difficulty hearing; no hearing aids.  Scheduled to see ENT soon. Vision Screening - Comments:: Wears rx glasses - up to date with routine eye exams with Shari Prows, MD.   Dietary issues and exercise activities discussed:     Goals Addressed             This Visit's Progress    My goal for 2024 is to join the gym.        Depression Screen    09/07/2022   11:20 AM 05/08/2022    9:00 AM  PHQ 2/9 Scores  PHQ - 2 Score 1 0  PHQ- 9 Score 2 0    Fall Risk    09/07/2022   11:20 AM 06/22/2022    9:42 AM  Fall Risk   Falls in the past year? 0 0  Number falls in past yr: 0 0  Injury with Fall? 0 0  Risk for fall due to : No Fall Risks   Follow up Falls prevention discussed Falls evaluation completed    MEDICARE RISK AT HOME:  Medicare Risk at Home - 09/07/22 1119     Any stairs in or around the home? Yes    If so, are  there any without handrails? No    Home free of loose throw rugs in walkways, pet beds, electrical cords, etc? Yes    Adequate lighting in your home to reduce risk of falls? Yes    Life alert? No    Use of a cane, walker or w/c? No    Grab bars in the bathroom? No    Shower chair or bench in shower? No    Elevated toilet seat or a handicapped toilet? No             TIMED UP AND GO:  Was the test performed?  No    Cognitive Function:        09/07/2022   11:23 AM  6CIT Screen  What Year? 0 points  What month? 0 points  What time? 0 points  Count back from 20 0 points  Months in reverse 0 points  Repeat phrase 0 points  Total Score 0 points    Immunizations Immunization History  Administered Date(s) Administered   COVID-19, mRNA, vaccine(Comirnaty)12 years and older 02/14/2022   Influenza-Unspecified 02/14/2022   PFIZER(Purple Top)SARS-COV-2 Vaccination 04/24/2019, 05/15/2019, 12/19/2019, 07/01/2020, 02/25/2021   PNEUMOCOCCAL CONJUGATE-20 07/23/2020   Pneumococcal Polysaccharide-23 07/17/2019   Tdap 12/07/2016    TDAP status: Up to date  Flu Vaccine status: Up to date  Pneumococcal vaccine status: Up to date  Covid-19 vaccine status: Completed vaccines  Qualifies for Shingles Vaccine? Yes   Zostavax completed No   Shingrix Completed?: No.    Education has been provided regarding the importance of this vaccine. Patient has been advised to call insurance company to determine out of pocket expense if they have not yet received this vaccine. Advised may also receive vaccine at local pharmacy or Health Dept. Verbalized acceptance and understanding.  Screening Tests Health Maintenance  Topic Date Due   Hepatitis C Screening  Never done   Zoster Vaccines- Shingrix (1 of  2) Never done   INFLUENZA VACCINE  10/12/2022   Medicare Annual Wellness (AWV)  09/07/2023   DTaP/Tdap/Td (2 - Td or Tdap) 12/08/2026   Colonoscopy  09/18/2027   Pneumonia Vaccine 38+ Years old   Completed   COVID-19 Vaccine  Completed   HPV VACCINES  Aged Out    Health Maintenance  Health Maintenance Due  Topic Date Due   Hepatitis C Screening  Never done   Zoster Vaccines- Shingrix (1 of 2) Never done    Colorectal cancer screening: Type of screening: Colonoscopy. Completed 09/17/2017. Repeat every 10 years  Lung Cancer Screening: (Low Dose CT Chest recommended if Age 21-80 years, 20 pack-year currently smoking OR have quit w/in 15years.) does not qualify.   Lung Cancer Screening Referral: no  Additional Screening:  Hepatitis C Screening: does qualify; Completed: no  Vision Screening: Recommended annual ophthalmology exams for early detection of glaucoma and other disorders of the eye. Is the patient up to date with their annual eye exam?  Yes  Who is the provider or what is the name of the office in which the patient attends annual eye exams? Shari Prows, MD. If pt is not established with a provider, would they like to be referred to a provider to establish care? No .   Dental Screening: Recommended annual dental exams for proper oral hygiene  Diabetic Foot Exam: N/A  Community Resource Referral / Chronic Care Management: CRR required this visit?  No   CCM required this visit?  No     Plan:     I have personally reviewed and noted the following in the patient's chart:   Medical and social history Use of alcohol, tobacco or illicit drugs  Current medications and supplements including opioid prescriptions. Patient is not currently taking opioid prescriptions. Functional ability and status Nutritional status Physical activity Advanced directives List of other physicians Hospitalizations, surgeries, and ER visits in previous 12 months Vitals Screenings to include cognitive, depression, and falls Referrals and appointments  In addition, I have reviewed and discussed with patient certain preventive protocols, quality metrics, and best practice  recommendations. A written personalized care plan for preventive services as well as general preventive health recommendations were provided to patient.     Mickeal Needy, LPN   06/19/8117   After Visit Summary: (Mail) Due to this being a telephonic visit, the after visit summary with patients personalized plan was offered to patient via mail   Nurse Notes: Normal cognitive status assessed by direct observation via telephone conversation by this Nurse Health Advisor. No abnormalities found.

## 2022-09-07 NOTE — Patient Instructions (Addendum)
[name] , Thank you for taking time to come for your Medicare Wellness Visit. I appreciate your ongoing commitment to your health goals. Please review the following plan we discussed and let me know if I can assist you in the future.   These are the goals we discussed:  Goals      My goal for 2024 is to join the gym.        This is a list of the screening recommended for you and due dates:  Health Maintenance  Topic Date Due   Hepatitis C Screening  Never done   Zoster (Shingles) Vaccine (1 of 2) Never done   Flu Shot  10/12/2022   Medicare Annual Wellness Visit  09/07/2023   DTaP/Tdap/Td vaccine (2 - Td or Tdap) 12/08/2026   Colon Cancer Screening  09/18/2027   Pneumonia Vaccine  Completed   COVID-19 Vaccine  Completed   HPV Vaccine  Aged Out    Advanced directives: Yes  Conditions/risks identified: Yes  Next appointment: It was nice speaking with you today!  Please follow up in one year for your annual wellness visit via telephone call with Nurse Percell Miller on 09/13/2023 at 11:15 a.m.  If you need to cancel or reschedule please call [phone].  Preventive Care 22 Years and Older, Male  Preventive care refers to lifestyle choices and visits with your health care provider that can promote health and wellness. What does preventive care include? A yearly physical exam. This is also called an annual well check. Dental exams once or twice a year. Routine eye exams. Ask your health care provider how often you should have your eyes checked. Personal lifestyle choices, including: Daily care of your teeth and gums. Regular physical activity. Eating a healthy diet. Avoiding tobacco and drug use. Limiting alcohol use. Practicing safe sex. Taking low doses of aspirin every day. Taking vitamin and mineral supplements as recommended by your health care provider. What happens during an annual well check? The services and screenings done by your health care provider during your  annual well check will depend on your age, overall health, lifestyle risk factors, and family history of disease. Counseling  Your health care provider may ask you questions about your: Alcohol use. Tobacco use. Drug use. Emotional well-being. Home and relationship well-being. Sexual activity. Eating habits. History of falls. Memory and ability to understand (cognition). Work and work Astronomer. Screening  You may have the following tests or measurements: Height, weight, and BMI. Blood pressure. Lipid and cholesterol levels. These may be checked every 5 years, or more frequently if you are over 39 years old. Skin check. Lung cancer screening. You may have this screening every year starting at age 47 if you have a 30-pack-year history of smoking and currently smoke or have quit within the past 15 years. Fecal occult blood test (FOBT) of the stool. You may have this test every year starting at age 78. Flexible sigmoidoscopy or colonoscopy. You may have a sigmoidoscopy every 5 years or a colonoscopy every 10 years starting at age 48. Prostate cancer screening. Recommendations will vary depending on your family history and other risks. Hepatitis C blood test. Hepatitis B blood test. Sexually transmitted disease (STD) testing. Diabetes screening. This is done by checking your blood sugar (glucose) after you have not eaten for a while (fasting). You may have this done every 1-3 years. Abdominal aortic aneurysm (AAA) screening. You may need this if you are a current or former smoker. Osteoporosis. You may  be screened starting at age 45 if you are at high risk. Talk with your health care provider about your test results, treatment options, and if necessary, the need for more tests. Vaccines  Your health care provider may recommend certain vaccines, such as: Influenza vaccine. This is recommended every year. Tetanus, diphtheria, and acellular pertussis (Tdap, Td) vaccine. You may need a Td  booster every 10 years. Zoster vaccine. You may need this after age 62. Pneumococcal 13-valent conjugate (PCV13) vaccine. One dose is recommended after age 1. Pneumococcal polysaccharide (PPSV23) vaccine. One dose is recommended after age 56. Talk to your health care provider about which screenings and vaccines you need and how often you need them. This information is not intended to replace advice given to you by your health care provider. Make sure you discuss any questions you have with your health care provider. Document Released: 03/26/2015 Document Revised: 11/17/2015 Document Reviewed: 12/29/2014 Elsevier Interactive Patient Education  2017 [place] Prevention in the Home Falls can cause injuries. They can happen to people of all ages. There are many things you can do to make your home safe and to help prevent falls. What can I do on the outside of my home? Regularly fix the edges of walkways and driveways and fix any cracks. Remove anything that might make you trip as you walk through a door, such as a raised step or threshold. Trim any bushes or trees on the path to your home. Use bright outdoor lighting. Clear any walking paths of anything that might make someone trip, such as rocks or tools. Regularly check to see if handrails are loose or broken. Make sure that both sides of any steps have handrails. Any raised decks and porches should have guardrails on the edges. Have any leaves, snow, or ice cleared regularly. Use sand or salt on walking paths during winter. Clean up any spills in your garage right away. This includes oil or grease spills. What can I do in the bathroom? Use night lights. Install grab bars by the toilet and in the tub and shower. Do not use towel bars as grab bars. Use non-skid mats or decals in the tub or shower. If you need to sit down in the shower, use a plastic, non-slip stool. Keep the floor dry. Clean up any water that spills on the floor  as soon as it happens. Remove soap buildup in the tub or shower regularly. Attach bath mats securely with double-sided non-slip rug tape. Do not have throw rugs and other things on the floor that can make you trip. What can I do in the bedroom? Use night lights. Make sure that you have a light by your bed that is easy to reach. Do not use any sheets or blankets that are too big for your bed. They should not hang down onto the floor. Have a firm chair that has side arms. You can use this for support while you get dressed. Do not have throw rugs and other things on the floor that can make you trip. What can I do in the kitchen? Clean up any spills right away. Avoid walking on wet floors. Keep items that you use a lot in easy-to-reach places. If you need to reach something above you, use a strong step stool that has a grab bar. Keep electrical cords out of the way. Do not use floor polish or wax that makes floors slippery. If you must use wax, use non-skid floor wax. Do not  have throw rugs and other things on the floor that can make you trip. What can I do with my stairs? Do not leave any items on the stairs. Make sure that there are handrails on both sides of the stairs and use them. Fix handrails that are broken or loose. Make sure that handrails are as long as the stairways. Check any carpeting to make sure that it is firmly attached to the stairs. Fix any carpet that is loose or worn. Avoid having throw rugs at the top or bottom of the stairs. If you do have throw rugs, attach them to the floor with carpet tape. Make sure that you have a light switch at the top of the stairs and the bottom of the stairs. If you do not have them, ask someone to add them for you. What else can I do to help prevent falls? Wear shoes that: Do not have high heels. Have rubber bottoms. Are comfortable and fit you well. Are closed at the toe. Do not wear sandals. If you use a stepladder: Make sure that it is  fully opened. Do not climb a closed stepladder. Make sure that both sides of the stepladder are locked into place. Ask someone to hold it for you, if possible. Clearly mark and make sure that you can see: Any grab bars or handrails. First and last steps. Where the edge of each step is. Use tools that help you move around (mobility aids) if they are needed. These include: Canes. Walkers. Scooters. Crutches. Turn on the lights when you go into a dark area. Replace any light bulbs as soon as they burn out. Set up your furniture so you have a clear path. Avoid moving your furniture around. If any of your floors are uneven, fix them. If there are any pets around you, be aware of where they are. Review your medicines with your doctor. Some medicines can make you feel dizzy. This can increase your chance of falling. Ask your doctor what other things that you can do to help prevent falls. This information is not intended to replace advice given to you by your health care provider. Make sure you discuss any questions you have with your health care provider. Document Released: 12/24/2008 Document Revised: 08/05/2015 Document Reviewed: 04/03/2014 Elsevier Interactive Patient Education  2017 [employer].

## 2022-09-12 DIAGNOSIS — H9222 Otorrhagia, left ear: Secondary | ICD-10-CM | POA: Diagnosis not present

## 2022-09-12 DIAGNOSIS — Z9622 Myringotomy tube(s) status: Secondary | ICD-10-CM | POA: Diagnosis not present

## 2022-09-25 ENCOUNTER — Ambulatory Visit: Payer: Medicare Other | Admitting: Family Medicine

## 2022-09-25 NOTE — Progress Notes (Deleted)
[facility] [address] [place] [address] Phone: [phone] Subjective:    I'm seeing this patient by the request  of:  Etta Grandchild, MD  CC: Low back pain follow-up  [id number]  Edwin Brown is a 67 y.o. male coming in with complaint of low back pain.  Onset-  Location Duration-  Character- Aggravating factors- Reliving factors-  Therapies tried-  Severity-     Past Medical History:  Diagnosis Date   Asthma    Depression    HTN (hypertension)    Hyperlipidemia    Syncope    Past Surgical History:  Procedure Laterality Date   APPENDECTOMY     CHOLECYSTECTOMY N/A 01/30/2014   Procedure: LAPAROSCOPIC CHOLECYSTECTOMY WITH INTRAOPERATIVE CHOLANGIOGRAM;  Surgeon: Valarie Merino, MD;  Location: [facility];  Service: General;  Laterality: N/A;   NASAL SINUS SURGERY     scaphoidectomy     VASECTOMY     Social History   Socioeconomic History   Marital status: Married    Spouse name: Not on file   Number of children: 3   Years of education: 15   Highest education level: Not on file  Occupational History   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  Tobacco Use   Smoking status: Never   Smokeless tobacco: Never  Vaping Use   Vaping status: Never Used  Substance and Sexual Activity   Alcohol use: Yes    Alcohol/week: 18.0 standard drinks of alcohol    Types: 8 Glasses of wine, 10 Cans of beer per week    Comment: 2/day   Drug use: No   Sexual activity: Yes    Partners: Female  Other Topics Concern   Not on file  Social History Narrative   Right handed   Drinks caffeine   Two story home   Retired nicely   Lives in home with wife   Social Determinants of Health   Financial Resource Strain: Low Risk  (09/07/2022)   Overall Financial Resource Strain (CARDIA)    Difficulty of Paying Living Expenses: Not hard at all  Food Insecurity: No Food Insecurity (09/07/2022)   Hunger Vital Sign     Worried About Running Out of Food in the Last Year: Never true    Ran Out of Food in the Last Year: Never true  Transportation Needs: No Transportation Needs (09/07/2022)   PRAPARE - Administrator, Civil Service (Medical): No    Lack of Transportation (Non-Medical): No  Physical Activity: Patient Declined (09/07/2022)   Exercise Vital Sign    Days of Exercise per Week: Patient declined    Minutes of Exercise per Session: Patient declined  Stress: No Stress Concern Present (09/07/2022)   [employer] of Occupational Health - Occupational Stress Questionnaire    Feeling of Stress : Only a little  Social Connections: Unknown (09/07/2022)   Social Connection and Isolation Panel [NHANES]    Frequency of Communication with Friends and Family: More than three times a week    Frequency of Social Gatherings with Friends and Family: More than three times a week    Attends Religious Services: Not on file    Active Member of Clubs or Organizations: Yes    Attends Banker Meetings: More than 4 times per year    Marital Status: Married   Allergies  Allergen Reactions   Dilaudid [Hydromorphone Hcl] Shortness Of Breath   Phenergan [Promethazine Hcl]  Other (See Comments)    Shaky, very out of sorts    Hydromorphone Hcl    Other     HORSE SERUM TETANUS ANTI TOX : PATIENT GETS HIGH FEVER   Promethazine Hcl    Family History  Problem Relation Age of Onset   Heart attack Father    Heart attack Brother 22     Current Outpatient Medications (Cardiovascular):    amLODipine-olmesartan (AZOR) 10-40 MG tablet, Take 1 tablet by mouth daily.   rosuvastatin (CRESTOR) 10 MG tablet, Take 1 tablet (10 mg total) by mouth daily.  Current Outpatient Medications (Respiratory):    albuterol (VENTOLIN HFA) 108 (90 Base) MCG/ACT inhaler, Inhale 2 puffs into the lungs every 6 (six) hours as needed for wheezing or shortness of breath.   loratadine (CLARITIN) 10 MG tablet, Take 10 mg by  mouth daily as needed for allergies.   montelukast (SINGULAIR) 10 MG tablet, Take 1 tablet (10 mg total) by mouth at bedtime.   TRELEGY ELLIPTA 100-62.5-25 MCG/ACT AEPB, INHALE 1 PUFF BY MOUTH INTO THE LUNGS ONCE DAILY    Current Outpatient Medications (Other):    ALPRAZolam (XANAX) 0.25 MG tablet, TAKE 1 TABLET BY MOUTH AT BEDTIME   glucosamine-chondroitin 500-400 MG tablet, Take 1 tablet by mouth 3 (three) times daily.   Methylsulfonylmethane (MSM) 1000 MG CAPS, Take by mouth.   Turmeric Curcumin 500 MG CAPS, Take 1,000 mg by mouth daily.   Reviewed prior external information including notes and imaging from  primary care provider As well as notes that were available from care everywhere and other healthcare systems.  Past medical history, social, surgical and family history all reviewed in electronic medical record.  No pertanent information unless stated regarding to the chief complaint.   Review of Systems:  No headache, visual changes, nausea, vomiting, diarrhea, constipation, dizziness, abdominal pain, skin rash, fevers, chills, night sweats, weight loss, swollen lymph nodes, body aches, joint swelling, chest pain, shortness of breath, mood changes. POSITIVE muscle aches  Objective  There were no vitals taken for this visit.   General: No apparent distress alert and oriented x3 mood and affect normal, dressed appropriately.  HEENT: Pupils equal, extraocular movements intact  Respiratory: Patient's speak in full sentences and does not appear short of breath  Cardiovascular: No lower extremity edema, non tender, no erythema  Low back exam shows   Osteopathic findings Cervical C2 flexed rotated and side bent right C4 flexed rotated and side bent left C6 flexed rotated and side bent left T3 extended rotated and side bent right inhaled third rib T9 extended rotated and side bent left L2 flexed rotated and side bent right Sacrum right on right     Impression and  Recommendations:     No problem-specific Assessment & Plan notes found for this encounter.     Decision today to treat with OMT was based on Physical Exam  After verbal consent patient was treated with HVLA, ME, FPR techniques in cervical, thoracic, rib, lumbar and sacral areas, all areas are chronic   Patient tolerated the procedure well with improvement in symptoms  Patient given exercises, stretches and lifestyle modifications  See medications in patient instructions if given  Patient will follow up in 4-8 weeks  The above documentation has been reviewed and is accurate and complete Judi Saa, DO

## 2022-09-26 ENCOUNTER — Ambulatory Visit (INDEPENDENT_AMBULATORY_CARE_PROVIDER_SITE_OTHER): Payer: Medicare Other | Admitting: Pulmonary Disease

## 2022-09-26 DIAGNOSIS — J4540 Moderate persistent asthma, uncomplicated: Secondary | ICD-10-CM

## 2022-09-26 LAB — PULMONARY FUNCTION TEST
DL/VA % pred: 129 %
DL/VA: 5.33 ml/min/mmHg/L
DLCO cor % pred: 119 %
DLCO cor: 29.53 ml/min/mmHg
DLCO unc % pred: 119 %
DLCO unc: 29.53 ml/min/mmHg
FEF 25-75 Post: 3.16 L/sec
FEF 25-75 Pre: 2.56 L/sec
FEF2575-%Change-Post: 23 %
FEF2575-%Pred-Post: 132 %
FEF2575-%Pred-Pre: 107 %
FEV1-%Change-Post: 7 %
FEV1-%Pred-Post: 109 %
FEV1-%Pred-Pre: 101 %
FEV1-Post: 3.36 L
FEV1-Pre: 3.12 L
FEV1FVC-%Change-Post: 2 %
FEV1FVC-%Pred-Pre: 99 %
FEV6-%Change-Post: -2 %
FEV6-%Pred-Post: 105 %
FEV6-%Pred-Pre: 107 %
FEV6-Post: 4.14 L
FEV6-Pre: 4.22 L
FEV6FVC-%Change-Post: 0 %
FEV6FVC-%Pred-Post: 105 %
FEV6FVC-%Pred-Pre: 105 %
FVC-%Change-Post: 4 %
FVC-%Pred-Post: 105 %
FVC-%Pred-Pre: 101 %
FVC-Post: 4.41 L
FVC-Pre: 4.22 L
Post FEV1/FVC ratio: 76 %
Post FEV6/FVC ratio: 100 %
Pre FEV1/FVC ratio: 74 %
Pre FEV6/FVC Ratio: 100 %
RV % pred: 141 %
RV: 3.25 L
TLC % pred: 103 %
TLC: 6.82 L

## 2022-09-26 NOTE — Patient Instructions (Signed)
 Full PFT performed today. °

## 2022-09-26 NOTE — Progress Notes (Signed)
 Full PFT performed today. °

## 2022-09-27 ENCOUNTER — Telehealth: Payer: Self-pay | Admitting: Pharmacist

## 2022-09-27 ENCOUNTER — Encounter: Payer: Self-pay | Admitting: Pulmonary Disease

## 2022-09-27 ENCOUNTER — Ambulatory Visit: Payer: Medicare Other | Admitting: Pulmonary Disease

## 2022-09-27 VITALS — BP 140/80 | HR 93 | Ht 68.0 in | Wt 191.8 lb

## 2022-09-27 DIAGNOSIS — R768 Other specified abnormal immunological findings in serum: Secondary | ICD-10-CM | POA: Diagnosis not present

## 2022-09-27 DIAGNOSIS — J4551 Severe persistent asthma with (acute) exacerbation: Secondary | ICD-10-CM

## 2022-09-27 MED ORDER — PREDNISONE 10 MG PO TABS: ORAL_TABLET | ORAL | 0 refills | Status: DC

## 2022-09-27 MED ORDER — METHYLPREDNISOLONE ACETATE 80 MG/ML IJ SUSP: 80.0000 mg | Freq: Once | INTRAMUSCULAR | Status: DC

## 2022-09-27 NOTE — Telephone Encounter (Signed)
Received notification from [place], CMA, that patient will be Dupixent new start. Paperwork should have been placed in pharmacy mailbox  Chesley Mires, PharmD, MPH, BCPS, CPP Clinical Pharmacist (Rheumatology and Pulmonology)

## 2022-09-27 NOTE — Progress Notes (Signed)
Synopsis: Referred in May 2024 for asthma by Etta Grandchild, MD  Subjective:   PATIENT ID: Edwin Brown GENDER: male DOB: 29-Apr-1955, MRN: [id number]  Chief Complaint  Patient presents with   Follow-up    F/up on PFT, sob, cough, wheezing.    This is a 67 year old gentleman past medical history of asthma, depression, hypertension, hyperlipidemia.  Patient has a longstanding history of asthma.  Currently on Trelegy 100.  He does feel like his symptoms is better managed.  At one time he was having weekly nocturnal symptoms.  He had this entire life.  He have not had as a child.  He worked as a Optometrist for many years.  He does notice that he has worse symptoms after eating fried foods.  No other significant reflux symptoms that he has noticed.  He is not on Singulair.  But he does have a history of sinus issues and symptoms.  He has had nasal sinus surgery in the past with Dr. Annalee Genta.  OV 09/27/2022: Here today for follow-up.  Recently had another flare this past weekend chest tightness eyes watering wheezing cough sputum production.  Still using his triple therapy inhaler regimen plus his antihistamine and Singulair.  Overall was feeling better shortly but has still having ongoing respiratory symptoms labs were completed last office visit showed elevated eosinophil count 200 and a IgE level of greater than 225.     Past Medical History:  Diagnosis Date   Asthma    Depression    HTN (hypertension)    Hyperlipidemia    Syncope      Family History  Problem Relation Age of Onset   Heart attack Father    Heart attack Brother 32     Past Surgical History:  Procedure Laterality Date   APPENDECTOMY     CHOLECYSTECTOMY N/A 01/30/2014   Procedure: LAPAROSCOPIC CHOLECYSTECTOMY WITH INTRAOPERATIVE CHOLANGIOGRAM;  Surgeon: Valarie Merino, MD;  Location: [facility];  Service: General;  Laterality: N/A;   NASAL SINUS SURGERY     scaphoidectomy     VASECTOMY      Social History    Socioeconomic History   Marital status: Married    Spouse name: Not on file   Number of children: 3   Years of education: 15   Highest education level: Not on file  Occupational History   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  Tobacco Use   Smoking status: Never   Smokeless tobacco: Never  Vaping Use   Vaping status: Never Used  Substance and Sexual Activity   Alcohol use: Yes    Alcohol/week: 18.0 standard drinks of alcohol    Types: 8 Glasses of wine, 10 Cans of beer per week    Comment: 2/day   Drug use: No   Sexual activity: Yes    Partners: Female  Other Topics Concern   Not on file  Social History Narrative   Right handed   Drinks caffeine   Two story home   Retired nicely   Lives in home with wife   Social Determinants of Health   Financial Resource Strain: Low Risk  (09/07/2022)   Overall Financial Resource Strain (CARDIA)    Difficulty of Paying Living Expenses: Not hard at all  Food Insecurity: No Food Insecurity (09/07/2022)   Hunger Vital Sign    Worried About Running Out of Food in the Last Year: Never true    Ran Out of Food in the Last  Year: Never true  Transportation Needs: No Transportation Needs (09/07/2022)   PRAPARE - Administrator, Civil Service (Medical): No    Lack of Transportation (Non-Medical): No  Physical Activity: Patient Declined (09/07/2022)   Exercise Vital Sign    Days of Exercise per Week: Patient declined    Minutes of Exercise per Session: Patient declined  Stress: No Stress Concern Present (09/07/2022)   [employer] of Occupational Health - Occupational Stress Questionnaire    Feeling of Stress : Only a little  Social Connections: Unknown (09/07/2022)   Social Connection and Isolation Panel [NHANES]    Frequency of Communication with Friends and Family: More than three times a week    Frequency of Social Gatherings with Friends and Family: More than three times a week    Attends  Religious Services: Not on file    Active Member of Clubs or Organizations: Yes    Attends Banker Meetings: More than 4 times per year    Marital Status: Married  Catering manager Violence: Not At Risk (09/07/2022)   Humiliation, Afraid, Rape, and Kick questionnaire    Fear of Current or Ex-Partner: No    Emotionally Abused: No    Physically Abused: No    Sexually Abused: No     Allergies  Allergen Reactions   Dilaudid [Hydromorphone Hcl] Shortness Of Breath   Phenergan [Promethazine Hcl] Other (See Comments)    Shaky, very out of sorts    Hydromorphone Hcl    Other     HORSE SERUM TETANUS ANTI TOX : PATIENT GETS HIGH FEVER   Promethazine Hcl      Outpatient Medications Prior to Visit  Medication Sig Dispense Refill   albuterol (VENTOLIN HFA) 108 (90 Base) MCG/ACT inhaler Inhale 2 puffs into the lungs every 6 (six) hours as needed for wheezing or shortness of breath. 8 g 3   ALPRAZolam (XANAX) 0.25 MG tablet TAKE 1 TABLET BY MOUTH AT BEDTIME 90 tablet 0   amLODipine-olmesartan (AZOR) 10-40 MG tablet Take 1 tablet by mouth daily. 90 tablet 1   glucosamine-chondroitin 500-400 MG tablet Take 1 tablet by mouth 3 (three) times daily.     loratadine (CLARITIN) 10 MG tablet Take 10 mg by mouth daily as needed for allergies.     Methylsulfonylmethane (MSM) 1000 MG CAPS Take by mouth.     montelukast (SINGULAIR) 10 MG tablet Take 1 tablet (10 mg total) by mouth at bedtime. 30 tablet 11   rosuvastatin (CRESTOR) 10 MG tablet Take 1 tablet (10 mg total) by mouth daily. 90 tablet 1   TRELEGY ELLIPTA 100-62.5-25 MCG/ACT AEPB INHALE 1 PUFF BY MOUTH INTO THE LUNGS ONCE DAILY 120 each 0   Turmeric Curcumin 500 MG CAPS Take 1,000 mg by mouth daily.     No facility-administered medications prior to visit.    Review of Systems  Constitutional:  Negative for chills, fever, malaise/fatigue and weight loss.  HENT:  Negative for hearing loss, sore throat and tinnitus.   Eyes:  Negative  for blurred vision and double vision.  Respiratory:  Positive for cough, shortness of breath and wheezing. Negative for hemoptysis, sputum production and stridor.   Cardiovascular:  Negative for chest pain, palpitations, orthopnea, leg swelling and PND.  Gastrointestinal:  Negative for abdominal pain, constipation, diarrhea, heartburn, nausea and vomiting.  Genitourinary:  Negative for dysuria, hematuria and urgency.  Musculoskeletal:  Negative for joint pain and myalgias.  Skin:  Negative for itching and rash.  Neurological:  Negative for dizziness, tingling, weakness and headaches.  Endo/Heme/Allergies:  Negative for environmental allergies. Does not bruise/bleed easily.  Psychiatric/Behavioral:  Negative for depression. The patient is not nervous/anxious and does not have insomnia.   All other systems reviewed and are negative.    Objective:  Physical Exam Vitals reviewed.  Constitutional:      General: He is not in acute distress.    Appearance: He is well-developed.  HENT:     Head: Normocephalic and atraumatic.  Eyes:     General: No scleral icterus.    Conjunctiva/sclera: Conjunctivae normal.     Pupils: Pupils are equal, round, and reactive to light.  Neck:     Vascular: No JVD.     Trachea: No tracheal deviation.  Cardiovascular:     Rate and Rhythm: Normal rate and regular rhythm.     Heart sounds: Normal heart sounds. No murmur heard. Pulmonary:     Effort: Pulmonary effort is normal. No tachypnea, accessory muscle usage or respiratory distress.     Breath sounds: No stridor. Wheezing present. No rhonchi or rales.  Abdominal:     General: Bowel sounds are normal. There is no distension.     Palpations: Abdomen is soft.     Tenderness: There is no abdominal tenderness.  Musculoskeletal:        General: No tenderness.     Cervical back: Neck supple.  Lymphadenopathy:     Cervical: No cervical adenopathy.  Skin:    General: Skin is warm and dry.     Capillary  Refill: Capillary refill takes less than 2 seconds.     Findings: No rash.  Neurological:     Mental Status: He is alert and oriented to person, place, and time.  Psychiatric:        Behavior: Behavior normal.      Vitals:   09/27/22 0854  BP: (!) 140/80  Pulse: 93  SpO2: 96%  Weight: 191 lb 12.8 oz (87 kg)  Height: 5\' 8"  (1.727 m)    96% on RA BMI Readings from Last 3 Encounters:  09/27/22 29.16 kg/m  09/07/22 29.04 kg/m  08/31/22 29.04 kg/m   Wt Readings from Last 3 Encounters:  09/27/22 191 lb 12.8 oz (87 kg)  09/07/22 191 lb (86.6 kg)  08/31/22 191 lb (86.6 kg)     CBC    Component Value Date/Time   WBC 4.6 07/19/2022 1003   RBC 4.56 07/19/2022 1003   HGB 13.8 07/19/2022 1003   HCT 40.5 07/19/2022 1003   PLT 305.0 07/19/2022 1003   MCV 88.9 07/19/2022 1003   MCH 29.7 01/31/2014 0550   MCHC 34.2 07/19/2022 1003   RDW 13.0 07/19/2022 1003   LYMPHSABS 1.6 07/19/2022 1003   MONOABS 0.4 07/19/2022 1003   EOSABS 0.2 07/19/2022 1003   BASOSABS 0.1 07/19/2022 1003     Chest Imaging:  No recent chest imaging  Pulmonary Functions Testing Results:    Latest Ref Rng & Units 09/26/2022    3:27 PM  PFT Results  FVC-Pre L 4.22  P  FVC-Predicted Pre % 101  P  FVC-Post L 4.41  P  FVC-Predicted Post % 105  P  Pre FEV1/FVC % % 74  P  Post FEV1/FCV % % 76  P  FEV1-Pre L 3.12  P  FEV1-Predicted Pre % 101  P  FEV1-Post L 3.36  P  DLCO uncorrected ml/min/mmHg 29.53  P  DLCO UNC% % 119  P  DLCO corrected ml/min/mmHg 29.53  P  DLCO COR %Predicted % 119  P  DLVA Predicted % 129  P  TLC L 6.82  P  TLC % Predicted % 103  P  RV % Predicted % 141  P    P Preliminary result    FeNO:   Pathology:   Echocardiogram:   Heart Catheterization:     Assessment & Plan:     ICD-10-CM   1. Severe persistent asthma with exacerbation  J45.51     2. Elevated IgE level  R76.8        Discussion:  This is a 67 year old gentleman, currently with an  exacerbation of severe persistent asthma has had multiple occasions throughout the year, chest tightness wheezing cough sputum production.  Another recent exacerbation.  Uses as needed albuterol currently on triple therapy inhaler regimen plus Singulair plus antihistamines.  Plan: Despite adequate therapy still having breakthrough symptoms and I think would benefit from a biologic. IgE is elevated, serum eosinophils at greater than 200. Will recommend him to start paperwork for Dupixent Will treat his asthma exacerbation today with a prednisone taper and an injection of Depo Solu-Medrol. Return to clinic in 3 months after starting injection of Dupixent.    Current Outpatient Medications:    albuterol (VENTOLIN HFA) 108 (90 Base) MCG/ACT inhaler, Inhale 2 puffs into the lungs every 6 (six) hours as needed for wheezing or shortness of breath., Disp: 8 g, Rfl: 3   ALPRAZolam (XANAX) 0.25 MG tablet, TAKE 1 TABLET BY MOUTH AT BEDTIME, Disp: 90 tablet, Rfl: 0   amLODipine-olmesartan (AZOR) 10-40 MG tablet, Take 1 tablet by mouth daily., Disp: 90 tablet, Rfl: 1   glucosamine-chondroitin 500-400 MG tablet, Take 1 tablet by mouth 3 (three) times daily., Disp: , Rfl:    loratadine (CLARITIN) 10 MG tablet, Take 10 mg by mouth daily as needed for allergies., Disp: , Rfl:    Methylsulfonylmethane (MSM) 1000 MG CAPS, Take by mouth., Disp: , Rfl:    montelukast (SINGULAIR) 10 MG tablet, Take 1 tablet (10 mg total) by mouth at bedtime., Disp: 30 tablet, Rfl: 11   predniSONE (DELTASONE) 10 MG tablet, Take 4 tabs by mouth once daily x4 days, then 3 tabs x4 days, 2 tabs x4 days, 1 tab x4 days and stop., Disp: 40 tablet, Rfl: 0   rosuvastatin (CRESTOR) 10 MG tablet, Take 1 tablet (10 mg total) by mouth daily., Disp: 90 tablet, Rfl: 1   TRELEGY ELLIPTA 100-62.5-25 MCG/ACT AEPB, INHALE 1 PUFF BY MOUTH INTO THE LUNGS ONCE DAILY, Disp: 120 each, Rfl: 0   Turmeric Curcumin 500 MG CAPS, Take 1,000 mg by mouth daily.,  Disp: , Rfl:    Josephine Igo, DO [facility] 09/27/2022 9:08 AM

## 2022-09-27 NOTE — Patient Instructions (Signed)
Thank you for visiting Dr. Tonia Brooms at [facility]. Today we recommend the following:  Meds ordered this encounter  Medications   predniSONE (DELTASONE) 10 MG tablet    Sig: Take 4 tabs by mouth once daily x4 days, then 3 tabs x4 days, 2 tabs x4 days, 1 tab x4 days and stop.    Dispense:  40 tablet    Refill:  0   Dupixtent paperwork   Return in about 3 months (around 12/28/2022) for with APP.    Please do your part to reduce the spread of COVID-19.

## 2022-09-28 NOTE — Telephone Encounter (Signed)
DMW paperwork received. Placed in PAP pending info folder in pharmacy office. Submitted a Prior Authorization request to [facility] for DUPIXENT via CoverMyMeds. Will update once we receive a response.  Key: [id number]  Dose: 400mg  SQ at Week 0 then 200mg  SQ every 2 weeks thereaffter  Chesley Mires, PharmD, MPH, BCPS, CPP Clinical Pharmacist (Rheumatology and Pulmonology)

## 2022-09-29 ENCOUNTER — Other Ambulatory Visit (HOSPITAL_COMMUNITY): Payer: Self-pay

## 2022-09-29 DIAGNOSIS — H903 Sensorineural hearing loss, bilateral: Secondary | ICD-10-CM | POA: Diagnosis not present

## 2022-09-29 DIAGNOSIS — Z9622 Myringotomy tube(s) status: Secondary | ICD-10-CM | POA: Diagnosis not present

## 2022-09-29 NOTE — Telephone Encounter (Signed)
Received notification from [facility] regarding a prior authorization for DUPIXENT. Authorization has been APPROVED from 09/28/22 to 03/31/2023. Approval letter sent to scan center.  Per test claim, copay for 28 days supply is $1968.60  Patient can fill through [facility]: [phone]   Authorization # [id number]  Will need to submit DMW pt assistance application

## 2022-10-02 ENCOUNTER — Other Ambulatory Visit: Payer: Self-pay | Admitting: Internal Medicine

## 2022-10-02 ENCOUNTER — Other Ambulatory Visit (HOSPITAL_COMMUNITY): Payer: Self-pay

## 2022-10-02 DIAGNOSIS — E785 Hyperlipidemia, unspecified: Secondary | ICD-10-CM

## 2022-10-02 NOTE — Telephone Encounter (Signed)
Submitted Patient Assistance Application to [employer] for DUPIXENT along with provider portion and PA approval letter. Will update patient when we receive a response.  Phone #: [phone] Fax #: [phone]

## 2022-10-03 NOTE — Telephone Encounter (Signed)
Conference called with [employer] and the patient. Patient stated his need for financial assistance to [employer] rep. She tried to process application during 25 minute conference call, but states she needs proof of household income from the patient. Patient should send this to [email or web address] using support code "6962952841". This information has been sent to patient via MyChart, and patient is aware to look at message and send this information in. Patient expressed gratitude for [place] spending so much time helping him with this application.

## 2022-10-05 ENCOUNTER — Telehealth: Payer: Self-pay | Admitting: Pulmonary Disease

## 2022-10-05 NOTE — Telephone Encounter (Signed)
Pharm that does Dupixant for him calling. There # is [phone]   They state the PT will bring in a form the Dr. Laury Axon to sign. We are to give that letter back to him so he can submit it back to the drug co.  FYI only.

## 2022-10-09 ENCOUNTER — Encounter: Payer: Self-pay | Admitting: Pulmonary Disease

## 2022-10-09 DIAGNOSIS — T7840XD Allergy, unspecified, subsequent encounter: Secondary | ICD-10-CM

## 2022-10-10 NOTE — Telephone Encounter (Signed)
DMW still has not received financial information from the patient. They need him to fax his federal tax return to [phone]

## 2022-10-10 NOTE — Telephone Encounter (Signed)
Spoke with patient. He was unable to get the online system to submit his income documents to work, so he spoke with [employer] yesterday. He will email Devki his documents for [place] to fax on his behalf.  Patient was forced to retire in December, so his federal tax return documents will not be an accurate representation of his current income. He plans to submit his wive's 4 most recent pay stubs and homes that will be sufficient proof of income.

## 2022-10-12 NOTE — Telephone Encounter (Signed)
Received email from patient stating he is having difficulty emailing income docs. He will plan to drop off copy at pulm clinic. He has been advised to ATTN: pharmacy team so that it is placed in our pharmacy mailbox for f/u  Chesley Mires, PharmD, MPH, BCPS, CPP Clinical Pharmacist (Rheumatology and Pulmonology)

## 2022-10-16 LAB — HM COLONOSCOPY

## 2022-10-20 NOTE — Telephone Encounter (Signed)
Received income docs from patient via email. Submitted to DMW via fax  Phone #: [phone] Fax #: [phone]

## 2022-10-26 ENCOUNTER — Encounter (INDEPENDENT_AMBULATORY_CARE_PROVIDER_SITE_OTHER): Payer: Self-pay

## 2022-11-09 NOTE — Telephone Encounter (Signed)
Called DMW for update on pt assistance program. Received a fax from  [employer] regarding an approval for DUPIXENT patient assistance from 11/09/22 to 03/13/2023. Approval letter will be faxed to clinic  Phone #: [phone]  ATC patient to schedule Dupixent new start. Unable to reach and VM box is full. Email sent to patient to request return call to schedule new start visit  Chesley Mires, PharmD, MPH, BCPS, CPP Clinical Pharmacist (Rheumatology and Pulmonology)

## 2022-11-10 NOTE — Telephone Encounter (Signed)
Pt scheduled fro Dupixent new start on 11/20/22. Will use sample  Chesley Mires, PharmD, MPH, BCPS, CPP Clinical Pharmacist (Rheumatology and Pulmonology)

## 2022-11-14 ENCOUNTER — Other Ambulatory Visit: Payer: Self-pay | Admitting: Internal Medicine

## 2022-11-14 DIAGNOSIS — F514 Sleep terrors [night terrors]: Secondary | ICD-10-CM

## 2022-11-15 ENCOUNTER — Ambulatory Visit: Payer: Medicare Other | Admitting: Pharmacist

## 2022-11-15 MED ORDER — DUPIXENT 200 MG/1.14ML ~~LOC~~ SOAJ: 200.0000 mg | SUBCUTANEOUS | 1 refills | Status: DC

## 2022-11-15 MED ORDER — DUPIXENT 200 MG/1.14ML ~~LOC~~ SOAJ
SUBCUTANEOUS | 0 refills | Status: DC
Start: 2022-11-15 — End: 2022-11-15

## 2022-11-15 NOTE — Patient Instructions (Signed)
Your next DUPIXENT dose is due on 11/29/22, 12/13/22, and every 14 days thereafter  CONTINUE Trelegy 1 puff once daily and montelukast 10mg  nightly  Your prescription will be shipped from [facility]. Their phone number is [phone] Please call to schedule shipment and confirm address. They will mail your medication to your home.  You will need to be seen by your provider in 3 to 4 months to assess how DUPIXENT is working for you. Please ensure you have a follow-up appointment scheduled in December 2024 or January 2025. Call our clinic if you need to make this appointment.  How to manage an injection site reaction: Remember the 5 C's: COUNTER - leave on the counter at least 30 minutes but up to overnight to bring medication to room temperature. This may help prevent stinging COLD - place something cold (like an ice gel pack or cold water bottle) on the injection site just before cleansing with alcohol. This may help reduce pain CLARITIN - use Claritin (generic name is loratadine) for the first two weeks of treatment or the day of, the day before, and the day after injecting. This will help to minimize injection site reactions CORTISONE CREAM - apply if injection site is irritated and itching CALL ME - if injection site reaction is bigger than the size of your fist, looks infected, blisters, or if you develop hives

## 2022-11-15 NOTE — Telephone Encounter (Signed)
Spoke with patient to get him started on Dupixent if possible. Patient states he is receiving shipment at home today from company and his wife is a nurse so was planning to start Dupixent today at home. He is going out of town this weekend and was unable to make appt on Monday anyways he states.  Reviewed administration sites, storage. Reviewed taking medication out of fridge at least 45 minutes before administration. Reviewed that he is continue Trelegy and montelukast and that Dupixent is add-on treatment that may take 3-4 months of treatment to determine efficacy. Recommended strict adherence to dosing schedule for [place] to gauge if medication is working. He confirms he has Epipen at home that is in-date and that he knows how to use.  He verbalized understanding. Will reach out with any concerns  Chesley Mires, PharmD, MPH, BCPS, CPP Clinical Pharmacist (Rheumatology and Pulmonology)

## 2022-11-15 NOTE — Progress Notes (Signed)
HPI Patient presents today to [facility] to see pharmacy team for Dupixent new start for moderate persistent asthma. PMH also significiant for HTN and dyslipidemia.  Reports relative worsening of asthma control in past 3 months. Finished prednisone taper in early August 2024. Has been using rescue inhaler more frequently now. He is going out of town for the next week and is eager to start  Respiratory Medications Current regimen: Trelegy 100-62.5-25mcg (1 puff once daily), montelukast 10mg  nightly Patient reports no known adherence challenges  OBJECTIVE Allergies  Allergen Reactions   Dilaudid [Hydromorphone Hcl] Shortness Of Breath   Phenergan [Promethazine Hcl] Other (See Comments)    Shaky, very out of sorts    Hydromorphone Hcl    Other     HORSE SERUM TETANUS ANTI TOX : PATIENT GETS HIGH FEVER   Promethazine Hcl     Outpatient Encounter Medications as of 11/15/2022  Medication Sig   albuterol (VENTOLIN HFA) 108 (90 Base) MCG/ACT inhaler Inhale 2 puffs into the lungs every 6 (six) hours as needed for wheezing or shortness of breath.   ALPRAZolam (XANAX) 0.25 MG tablet TAKE 1 TABLET BY MOUTH AT BEDTIME   amLODipine-olmesartan (AZOR) 10-40 MG tablet Take 1 tablet by mouth daily.   Dupilumab (DUPIXENT) 200 MG/1. SOPN Inject 400mg  into the skin at Week 0 then 200mg  eery 14 days thereafter   glucosamine-chondroitin 500-400 MG tablet Take 1 tablet by mouth 3 (three) times daily.   loratadine (CLARITIN) 10 MG tablet Take 10 mg by mouth daily as needed for allergies.   Methylsulfonylmethane (MSM) 1000 MG CAPS Take by mouth.   montelukast (SINGULAIR) 10 MG tablet Take 1 tablet (10 mg total) by mouth at bedtime.   rosuvastatin (CRESTOR) 10 MG tablet Take 1 tablet by mouth once daily   TRELEGY ELLIPTA 100-62.5-25 MCG/ACT AEPB INHALE 1 PUFF BY MOUTH INTO THE LUNGS ONCE DAILY   Turmeric Curcumin 500 MG CAPS Take 1,000 mg by mouth daily.   Facility-Administered Encounter  Medications as of 11/15/2022  Medication   methylPREDNISolone acetate (DEPO-MEDROL) injection 80 mg     Immunization History  Administered Date(s) Administered   COVID-19, mRNA, vaccine(Comirnaty)12 years and older 02/14/2022   Influenza-Unspecified 02/14/2022   PFIZER(Purple Top)SARS-COV-2 Vaccination 04/24/2019, 05/15/2019, 12/19/2019, 07/01/2020, 02/25/2021   PNEUMOCOCCAL CONJUGATE-20 07/23/2020   Pneumococcal Polysaccharide-23 07/17/2019   Tdap 12/07/2016     PFTs    Latest Ref Rng & Units 09/26/2022    3:27 PM  PFT Results  FVC-Pre L 4.22   FVC-Predicted Pre % 101   FVC-Post L 4.41   FVC-Predicted Post % 105   Pre FEV1/FVC % % 74   Post FEV1/FCV % % 76   FEV1-Pre L 3.12   FEV1-Predicted Pre % 101   FEV1-Post L 3.36   DLCO uncorrected ml/min/mmHg 29.53   DLCO UNC% % 119   DLCO corrected ml/min/mmHg 29.53   DLCO COR %Predicted % 119   DLVA Predicted % 129   TLC L 6.82   TLC % Predicted % 103   RV % Predicted % 141      Eosinophils Most recent blood eosinophil count was 200 cells/microL taken on 07/19/22.   IgE: 237 on 07/19/22   Assessment   Biologics training for dupilumab (Dupixent)  Goals of therapy: Mechanism: human monoclonal IgG4 antibody that inhibits interleukin-4 and interleukin-13 cytokine-induced responses, including release of proinflammatory cytokines, chemokines, and IgE Reviewed that Dupixent is add-on medication and patient must continue maintenance inhaler regimen. Response to therapy: may  take 4 months to determine efficacy. Discussed that patients generally feel improvement sooner than 4 months.  Side effects: injection site reaction (6-18%), antibody development (5-16%), ophthalmic conjunctivitis (2-16%), transient blood eosinophilia (1-2%)  Dose: 400mg  at Week 0 (administered today in clinic) followed by 200mg  every 14 days thereafter  Administration/Storage:  Reviewed administration sites of thigh or abdomen (at least 2-3 inches away from  abdomen). Reviewed the upper arm is only appropriate if caregiver is administering injection  Do not shake pen/syringe as this could lead to product foaming or precipitation. Do not use if solution is discolored or contains particulate matter or if window on prefilled pen is yellow (indicates pen has been used).  Reviewed storage of medication in refrigerator. Reviewed that Dupixent can be stored at room temperature in unopened carton for up to 14 days.  Access: Approval of Dupixent through: patient assistance  Patient self-administered Dupixent 200mg /1.65ml x 2 (total dose 400mg ) in right upper thigh and left upper thigh using sample Dupixent 200mg /1.47mL autoinjector pen  NDC: [phone] Lot: [id number] Expiration: 12/10/2024  Patient monitored for 30 minutes for adverse reaction.  Patient tolerated without issue. Injection site checked and no redness or swelling noted. Patient denies itchiness and irritation.  Medication Reconciliation  A drug regimen assessment was performed, including review of allergies, interactions, disease-state management, dosing and immunization history. Medications were reviewed with the patient, including name, instructions, indication, goals of therapy, potential side effects, importance of adherence, and safe use.  Drug interaction(s): none noted   PLAN Continue Dupixent 200mg  every 14 days.  Next dose is due 11/29/22 and every 14 days thereafter. Rx sent to: [facility]: [phone].  Patient provided with pharmacy phone number. He is scheduled to receive shipment at home today. Continue maintenance asthma regimen of: Trelegy 100-62.5-25mcg (1 puff once daily), montelukast 10mg  nightly  All questions encouraged and answered.  Instructed patient to reach out with any further questions or concerns.  Thank you for allowing pharmacy to participate in this patient's care.  This appointment required 45 minutes of patient care (this includes precharting,  chart review, review of results, face-to-face care, etc.).   Chesley Mires, PharmD, MPH, BCPS, CPP Clinical Pharmacist (Rheumatology and Pulmonology)

## 2022-11-20 ENCOUNTER — Other Ambulatory Visit: Payer: Medicare Other | Admitting: Pharmacist

## 2022-12-06 ENCOUNTER — Ambulatory Visit: Payer: Medicare Other | Admitting: Internal Medicine

## 2022-12-06 ENCOUNTER — Encounter: Payer: Self-pay | Admitting: Internal Medicine

## 2022-12-06 VITALS — BP 136/84 | HR 92 | Temp 98.2°F | Resp 16 | Ht 68.0 in | Wt 193.0 lb

## 2022-12-06 DIAGNOSIS — F1024 Alcohol dependence with alcohol-induced mood disorder: Secondary | ICD-10-CM

## 2022-12-06 DIAGNOSIS — Z23 Encounter for immunization: Secondary | ICD-10-CM | POA: Insufficient documentation

## 2022-12-06 DIAGNOSIS — Z0001 Encounter for general adult medical examination with abnormal findings: Secondary | ICD-10-CM | POA: Insufficient documentation

## 2022-12-06 DIAGNOSIS — Z1159 Encounter for screening for other viral diseases: Secondary | ICD-10-CM

## 2022-12-06 DIAGNOSIS — E785 Hyperlipidemia, unspecified: Secondary | ICD-10-CM

## 2022-12-06 DIAGNOSIS — Z0000 Encounter for general adult medical examination without abnormal findings: Secondary | ICD-10-CM

## 2022-12-06 DIAGNOSIS — F514 Sleep terrors [night terrors]: Secondary | ICD-10-CM

## 2022-12-06 DIAGNOSIS — R972 Elevated prostate specific antigen [PSA]: Secondary | ICD-10-CM | POA: Diagnosis not present

## 2022-12-06 DIAGNOSIS — Z125 Encounter for screening for malignant neoplasm of prostate: Secondary | ICD-10-CM | POA: Diagnosis not present

## 2022-12-06 DIAGNOSIS — T50905A Adverse effect of unspecified drugs, medicaments and biological substances, initial encounter: Secondary | ICD-10-CM | POA: Diagnosis not present

## 2022-12-06 DIAGNOSIS — F331 Major depressive disorder, recurrent, moderate: Secondary | ICD-10-CM | POA: Insufficient documentation

## 2022-12-06 DIAGNOSIS — I1A0 Resistant hypertension: Secondary | ICD-10-CM | POA: Diagnosis not present

## 2022-12-06 LAB — URINALYSIS, ROUTINE W REFLEX MICROSCOPIC
Bilirubin Urine: NEGATIVE
Hgb urine dipstick: NEGATIVE
Ketones, ur: NEGATIVE
Leukocytes,Ua: NEGATIVE
Nitrite: NEGATIVE
RBC / HPF: NONE SEEN (ref 0–?)
Specific Gravity, Urine: 1.025 (ref 1.000–1.030)
Total Protein, Urine: NEGATIVE
Urine Glucose: NEGATIVE
Urobilinogen, UA: 0.2 (ref 0.0–1.0)
pH: 6 (ref 5.0–8.0)

## 2022-12-06 LAB — TSH: TSH: 1.33 u[IU]/mL (ref 0.35–5.50)

## 2022-12-06 LAB — BASIC METABOLIC PANEL
BUN: 16 mg/dL (ref 6–23)
CO2: 31 mEq/L (ref 19–32)
Calcium: 10.7 mg/dL — ABNORMAL HIGH (ref 8.4–10.5)
Chloride: 104 mEq/L (ref 96–112)
Creatinine, Ser: 1 mg/dL (ref 0.40–1.50)
GFR: 77.27 mL/min (ref >60.00)
Glucose, Bld: 89 mg/dL (ref 70–99)
Potassium: 4.9 mEq/L (ref 3.5–5.1)
Sodium: 141 mEq/L (ref 135–145)

## 2022-12-06 LAB — PSA: PSA: 5.86 ng/mL — ABNORMAL HIGH (ref 0.10–4.00)

## 2022-12-06 LAB — HEPATIC FUNCTION PANEL
ALT: 20 U/L (ref 0–53)
AST: 19 U/L (ref 0–37)
Albumin: 4.5 g/dL (ref 3.5–5.2)
Alkaline Phosphatase: 69 U/L (ref 39–117)
Bilirubin, Direct: 0.1 mg/dL (ref 0.0–0.3)
Total Bilirubin: 0.4 mg/dL (ref 0.2–1.2)
Total Protein: 6.9 g/dL (ref 6.0–8.3)

## 2022-12-06 MED ORDER — CITALOPRAM HYDROBROMIDE 40 MG PO TABS
40.0000 mg | ORAL_TABLET | Freq: Every day | ORAL | 1 refills | Status: AC
Start: 2022-12-06 — End: ?

## 2022-12-06 MED ORDER — SHINGRIX 50 MCG/0.5ML IM SUSR
0.5000 mL | Freq: Once | INTRAMUSCULAR | 1 refills | Status: AC
Start: 2022-12-06 — End: 2022-12-06

## 2022-12-06 NOTE — Patient Instructions (Signed)
Health Maintenance, Male Adopting a healthy lifestyle and getting preventive care are important in promoting health and wellness. Ask your health care provider about: The right schedule for you to have regular tests and exams. Things you can do on your own to prevent diseases and keep yourself healthy. What should I know about diet, weight, and exercise? Eat a healthy diet  Eat a diet that includes plenty of vegetables, fruits, low-fat dairy products, and lean protein. Do not eat a lot of foods that are high in solid fats, added sugars, or sodium. Maintain a healthy weight Body mass index (BMI) is a measurement that can be used to identify possible weight problems. It estimates body fat based on height and weight. Your health care provider can help determine your BMI and help you achieve or maintain a healthy weight. Get regular exercise Get regular exercise. This is one of the most important things you can do for your health. Most adults should: Exercise for at least 150 minutes each week. The exercise should increase your heart rate and make you sweat (moderate-intensity exercise). Do strengthening exercises at least twice a week. This is in addition to the moderate-intensity exercise. Spend less time sitting. Even light physical activity can be beneficial. Watch cholesterol and blood lipids Have your blood tested for lipids and cholesterol at 68 years of age, then have this test every 5 years. You may need to have your cholesterol levels checked more often if: Your lipid or cholesterol levels are high. You are older than 68 years of age. You are at high risk for heart disease. What should I know about cancer screening? Many types of cancers can be detected early and may often be prevented. Depending on your health history and family history, you may need to have cancer screening at various ages. This may include screening for: Colorectal cancer. Prostate cancer. Skin cancer. Lung  cancer. What should I know about heart disease, diabetes, and high blood pressure? Blood pressure and heart disease High blood pressure causes heart disease and increases the risk of stroke. This is more likely to develop in people who have high blood pressure readings or are overweight. Talk with your health care provider about your target blood pressure readings. Have your blood pressure checked: Every 3-5 years if you are 18-39 years of age. Every year if you are 40 years old or older. If you are between the ages of 65 and 75 and are a current or former smoker, ask your health care provider if you should have a one-time screening for abdominal aortic aneurysm (AAA). Diabetes Have regular diabetes screenings. This checks your fasting blood sugar level. Have the screening done: Once every three years after age 45 if you are at a normal weight and have a low risk for diabetes. More often and at a younger age if you are overweight or have a high risk for diabetes. What should I know about preventing infection? Hepatitis B If you have a higher risk for hepatitis B, you should be screened for this virus. Talk with your health care provider to find out if you are at risk for hepatitis B infection. Hepatitis C Blood testing is recommended for: Everyone born from 1945 through 1965. Anyone with known risk factors for hepatitis C. Sexually transmitted infections (STIs) You should be screened each year for STIs, including gonorrhea and chlamydia, if: You are sexually active and are younger than 68 years of age. You are older than 68 years of age and your   health care provider tells you that you are at risk for this type of infection. Your sexual activity has changed since you were last screened, and you are at increased risk for chlamydia or gonorrhea. Ask your health care provider if you are at risk. Ask your health care provider about whether you are at high risk for HIV. Your health care provider  may recommend a prescription medicine to help prevent HIV infection. If you choose to take medicine to prevent HIV, you should first get tested for HIV. You should then be tested every 3 months for as long as you are taking the medicine. Follow these instructions at home: Alcohol use Do not drink alcohol if your health care provider tells you not to drink. If you drink alcohol: Limit how much you have to 0-2 drinks a day. Know how much alcohol is in your drink. In the [place] one drink equals one 12 oz bottle of beer (355 mL), one 5 oz glass of wine (148 mL), or one 1 oz glass of hard liquor (44 mL). Lifestyle Do not use any products that contain nicotine or tobacco. These products include cigarettes, chewing tobacco, and vaping devices, such as e-cigarettes. If you need help quitting, ask your health care provider. Do not use street drugs. Do not share needles. Ask your health care provider for help if you need support or information about quitting drugs. General instructions Schedule regular health, dental, and eye exams. Stay current with your vaccines. Tell your health care provider if: You often feel depressed. You have ever been abused or do not feel safe at home. Summary Adopting a healthy lifestyle and getting preventive care are important in promoting health and wellness. Follow your health care provider's instructions about healthy diet, exercising, and getting tested or screened for diseases. Follow your health care provider's instructions on monitoring your cholesterol and blood pressure. This information is not intended to replace advice given to you by your health care provider. Make sure you discuss any questions you have with your health care provider. Document Revised: 07/19/2020 Document Reviewed: 07/19/2020 Elsevier Patient Education  2024 Elsevier Inc.  

## 2022-12-06 NOTE — Progress Notes (Signed)
Subjective:  Patient ID: JABEZ SMTIH, male    DOB: October 25, 1955  Age: 67 y.o. MRN: [id number]  CC: Annual Exam, Hyperlipidemia, and Depression   HPI Edwin Brown presents for a CPX and f/up ---  Discussed the use of AI scribe software for clinical note transcription with the patient, who gave verbal consent to proceed.  History of Present Illness   The patient, with a history of chronic back pain, asthma, and depression, reports significant improvement in back pain following a third epidural injection. They deny any chest pain, shortness of breath, dizziness, lightheadedness, abdominal pain, nausea, vomiting, diarrhea, and constipation. They have been started on Dupixent by their pulmonologist, which seems to be alleviating their chronic wheezing.  The patient acknowledges chronic depressive symptoms, including lack of motivation, difficulty concentrating, and feelings of hopelessness. They deny any suicidal or homicidal ideation. They are currently on an unspecified antidepressant, which they feel keeps their symptoms "between the lines." They report no desire to augment or change their current medication regimen.  The patient also reports significant sleep disturbances, characterized by frequent awakenings due to vivid, distressing dreams. They deny any difficulty falling asleep and do not feel that their sleep issues are related to anxiety. They are currently taking Alprazolam.  The patient admits to consuming a couple of beers nightly, occasionally more than three. They express a desire to reduce their alcohol consumption but are not interested in formal treatment programs such as AA, inpatient rehab, or psychotherapy.  The patient also reports a history of allergies and frequent infections, which they feel are being improved with Dupixent. They deny any urinary symptoms suggestive of an enlarged prostate. They are open to considering medication to help with their sleep disturbances and  distressing dreams.       Outpatient Medications Prior to Visit  Medication Sig Dispense Refill   albuterol (VENTOLIN HFA) 108 (90 Base) MCG/ACT inhaler Inhale 2 puffs into the lungs every 6 (six) hours as needed for wheezing or shortness of breath. 8 g 3   ALPRAZolam (XANAX) 0.25 MG tablet TAKE 1 TABLET BY MOUTH AT BEDTIME 90 tablet 1   amLODipine-olmesartan (AZOR) 10-40 MG tablet Take 1 tablet by mouth daily. 90 tablet 1   Dupilumab (DUPIXENT) 200 MG/1. SOPN Inject 200 mg into the skin every 14 (fourteen) days. 6.84 mL 1   glucosamine-chondroitin 500-400 MG tablet Take 1 tablet by mouth 3 (three) times daily.     loratadine (CLARITIN) 10 MG tablet Take 10 mg by mouth daily as needed for allergies.     Methylsulfonylmethane (MSM) 1000 MG CAPS Take by mouth.     montelukast (SINGULAIR) 10 MG tablet Take 1 tablet (10 mg total) by mouth at bedtime. 30 tablet 11   rosuvastatin (CRESTOR) 10 MG tablet Take 1 tablet by mouth once daily 90 tablet 0   TRELEGY ELLIPTA 100-62.5-25 MCG/ACT AEPB INHALE 1 PUFF BY MOUTH INTO THE LUNGS ONCE DAILY 120 each 0   Turmeric Curcumin 500 MG CAPS Take 1,000 mg by mouth daily.     methylPREDNISolone acetate (DEPO-MEDROL) injection 80 mg      No facility-administered medications prior to visit.    ROS Review of Systems  Constitutional: Negative.  Negative for chills, diaphoresis, fatigue and fever.  HENT: Negative.    Eyes: Negative.   Respiratory: Negative.  Negative for cough, chest tightness, shortness of breath and wheezing.   Cardiovascular:  Negative for chest pain, palpitations and leg swelling.  Gastrointestinal:  Negative for abdominal  pain, constipation, diarrhea, nausea and vomiting.  Genitourinary: Negative.  Negative for difficulty urinating.  Musculoskeletal: Negative.  Negative for arthralgias and myalgias.  Skin: Negative.   Neurological: Negative.  Negative for dizziness and weakness.  Hematological:  Negative for adenopathy. Does not  bruise/bleed easily.  Psychiatric/Behavioral:  Positive for confusion, dysphoric mood and sleep disturbance. Negative for hallucinations and suicidal ideas. The patient is nervous/anxious.     Objective:  BP 136/84 (BP Location: Left Arm, Patient Position: Sitting, Cuff Size: Large)   Pulse 92   Temp 98.2 F (36.8 C) (Oral)   Resp 16   Ht 5\' 8"  (1.727 m)   Wt 193 lb (87.5 kg)   SpO2 98%   BMI 29.35 kg/m   BP Readings from Last 3 Encounters:  12/06/22 136/84  09/27/22 (!) 140/80  08/31/22 136/80    Wt Readings from Last 3 Encounters:  12/06/22 193 lb (87.5 kg)  09/27/22 191 lb 12.8 oz (87 kg)  09/07/22 191 lb (86.6 kg)    Physical Exam Vitals reviewed.  Constitutional:      Appearance: Normal appearance.  HENT:     Mouth/Throat:     Mouth: Mucous membranes are moist.  Eyes:     General: No scleral icterus.    Conjunctiva/sclera: Conjunctivae normal.  Cardiovascular:     Rate and Rhythm: Normal rate and regular rhythm.     Heart sounds: No murmur heard. Pulmonary:     Effort: Pulmonary effort is normal.     Breath sounds: No stridor. No wheezing, rhonchi or rales.  Abdominal:     General: Abdomen is flat.     Palpations: There is no mass.     Tenderness: There is no abdominal tenderness. There is no guarding.     Hernia: No hernia is present.  Musculoskeletal:        General: Normal range of motion.     Cervical back: Neck supple.     Right lower leg: No edema.     Left lower leg: No edema.  Lymphadenopathy:     Cervical: No cervical adenopathy.  Skin:    General: Skin is warm and dry.     Findings: No rash.  Neurological:     General: No focal deficit present.     Mental Status: He is alert. Mental status is at baseline.  Psychiatric:        Attention and Perception: Perception normal. He is inattentive.        Mood and Affect: Mood is anxious. Mood is not depressed. Affect is not flat or tearful.        Speech: He is communicative. Speech is  tangential. Speech is not delayed.        Behavior: Behavior normal. Behavior is cooperative.        Thought Content: Thought content normal. Thought content is not paranoid or delusional. Thought content does not include homicidal or suicidal ideation. Thought content does not include homicidal or suicidal plan.        Cognition and Memory: Cognition is impaired. Memory is impaired.     Lab Results  Component Value Date   WBC 4.6 07/19/2022   HGB 13.8 07/19/2022   HCT 40.5 07/19/2022   PLT 305.0 07/19/2022   GLUCOSE 89 12/06/2022   CHOL 182 05/08/2022   TRIG 161.0 (H) 05/08/2022   HDL 58.60 05/08/2022   LDLCALC 91 05/08/2022   ALT 20 12/06/2022   AST 19 12/06/2022   NA 141 12/06/2022  K 4.9 12/06/2022   CL 104 12/06/2022   CREATININE 1.00 12/06/2022   BUN 16 12/06/2022   CO2 31 12/06/2022   TSH 1.33 12/06/2022   PSA 5.86 (H) 12/06/2022    DG INJECT DIAG/THERA/INC NEEDLE/CATH/PLC EPI/LUMB/SAC W/IMG  Result Date: 04/17/2022 CLINICAL DATA:  67 year old male with lumbosacral spondylosis without myelopathy. He presents for his third right L4-L5 epidural steroid injection. His first injection (03/17/2022) resulted in minimal relief, however his second injection on 03/31/2022 resulted in approximately 40% relief of symptoms. FLUOROSCOPY: Radiation Exposure Index (as provided by the fluoroscopic device): 2.2 mGy Kerma PROCEDURE: The procedure, risks, benefits, and alternatives were explained to the patient. Questions regarding the procedure were encouraged and answered. The patient understands and consents to the procedure. LUMBAR EPIDURAL INJECTION: An interlaminar approach was performed on right at L4-L5. The overlying skin was cleansed and anesthetized. A 20 gauge epidural needle was advanced using loss-of-resistance technique. DIAGNOSTIC EPIDURAL INJECTION: Injection of Isovue-M 200 shows a good epidural pattern with spread above and below the level of needle placement, primarily on the  right no vascular opacification is seen. THERAPEUTIC EPIDURAL INJECTION: 80 mg of Depo-Medrol mixed with 2 mL 1% lidocaine were instilled. The procedure was well-tolerated, and the patient was discharged thirty minutes following the injection in good condition. COMPLICATIONS: None. IMPRESSION: Technically successful epidural injection on the right L4-L5 #3. Electronically Signed   By: Malachy Moan M.D.   On: 04/17/2022 10:25    Assessment & Plan:  Need for hepatitis C screening test -     Hepatitis C antibody; Future  Resistant hypertension - His BP is adequately well controlled. -     Basic metabolic panel; Future -     TSH; Future -     Urinalysis, Routine w reflex microscopic; Future  Hypercalcemia due to a drug -     Basic metabolic panel; Future  Flu vaccine need -     Flu Vaccine Trivalent High Dose (Fluad)  Encounter for general adult medical examination with abnormal findings - Exam completed, labs reviewed, vaccines reviewed and updated, cancer screenings addressed, pt ed material was given.   Prostate cancer screening -     PSA; Future  Need for prophylactic vaccination and inoculation against varicella -     Shingrix; Inject 0.5 mLs into the muscle once for 1 dose.  Dispense: 0.5 mL; Refill: 1  Dyslipidemia, goal LDL below 100 - LDL goal achieved. Doing well on the statin  -     Hepatic function panel; Future  Elevated PSA -     Ambulatory referral to Urology  Hypercalcemia -     Phosphorus; Future -     VITAMIN D 25 Hydroxy (Vit-D Deficiency, Fractures); Future -     PTH, intact and calcium; Future  Moderate episode of recurrent major depressive disorder (HCC) -     Citalopram Hydrobromide; Take 1 tablet (40 mg total) by mouth daily.  Dispense: 90 tablet; Refill: 1  Night terrors, adult -     Prazosin HCl; Take 1 capsule (1 mg total) by mouth at bedtime.  Dispense: 90 capsule; Refill: 1  Alcohol dependence with alcohol-induced mood disorder (HCC)- I  encouraged him to decrease his EtOH intake.     Follow-up: Return in about 6 months (around 06/05/2023).  Sanda Linger, MD

## 2022-12-07 LAB — HEPATITIS C ANTIBODY: Hepatitis C Ab: NONREACTIVE

## 2022-12-07 MED ORDER — PRAZOSIN HCL 1 MG PO CAPS
1.0000 mg | ORAL_CAPSULE | Freq: Every day | ORAL | 1 refills | Status: DC
Start: 2022-12-07 — End: 2023-07-24

## 2022-12-08 ENCOUNTER — Institutional Professional Consult (permissible substitution) (INDEPENDENT_AMBULATORY_CARE_PROVIDER_SITE_OTHER): Payer: Medicare Other | Admitting: Otolaryngology

## 2022-12-08 DIAGNOSIS — F1024 Alcohol dependence with alcohol-induced mood disorder: Secondary | ICD-10-CM | POA: Insufficient documentation

## 2022-12-13 ENCOUNTER — Ambulatory Visit: Payer: Medicare Other | Admitting: Urology

## 2022-12-15 ENCOUNTER — Encounter: Payer: Self-pay | Admitting: Internal Medicine

## 2022-12-19 ENCOUNTER — Other Ambulatory Visit: Payer: Self-pay | Admitting: Internal Medicine

## 2022-12-19 DIAGNOSIS — F1024 Alcohol dependence with alcohol-induced mood disorder: Secondary | ICD-10-CM

## 2023-01-03 ENCOUNTER — Other Ambulatory Visit (INDEPENDENT_AMBULATORY_CARE_PROVIDER_SITE_OTHER): Payer: Medicare Other

## 2023-01-03 LAB — VITAMIN D 25 HYDROXY (VIT D DEFICIENCY, FRACTURES): VITD: 35.77 ng/mL (ref 30.00–100.00)

## 2023-01-03 LAB — PHOSPHORUS: Phosphorus: 2.7 mg/dL (ref 2.3–4.6)

## 2023-01-05 LAB — PTH, INTACT AND CALCIUM
Calcium: 9.8 mg/dL (ref 8.6–10.3)
PTH: 61 pg/mL (ref 16–77)

## 2023-01-15 ENCOUNTER — Encounter: Payer: Self-pay | Admitting: Family Medicine

## 2023-01-16 ENCOUNTER — Other Ambulatory Visit: Payer: Self-pay

## 2023-01-16 DIAGNOSIS — M5416 Radiculopathy, lumbar region: Secondary | ICD-10-CM

## 2023-01-17 NOTE — Discharge Instructions (Signed)

## 2023-01-18 ENCOUNTER — Ambulatory Visit
Admission: RE | Admit: 2023-01-18 | Discharge: 2023-01-18 | Disposition: A | Discharge status: Home / Self Care | Payer: Medicare Other | Source: Ambulatory Visit | Attending: Family Medicine | Admitting: Family Medicine

## 2023-01-18 DIAGNOSIS — M5416 Radiculopathy, lumbar region: Secondary | ICD-10-CM

## 2023-01-18 MED ORDER — IOPAMIDOL (ISOVUE-M 200) INJECTION 41%
1.0000 mL | Freq: Once | INTRAMUSCULAR | Status: AC
  Administered 2023-01-18: 1 mL via EPIDURAL

## 2023-01-18 MED ORDER — METHYLPREDNISOLONE ACETATE 40 MG/ML INJ SUSP (RADIOLOG
80.0000 mg | Freq: Once | INTRAMUSCULAR | Status: AC
  Administered 2023-01-18: 80 mg via EPIDURAL

## 2023-02-09 ENCOUNTER — Other Ambulatory Visit: Payer: Self-pay | Admitting: Internal Medicine

## 2023-02-09 DIAGNOSIS — E785 Hyperlipidemia, unspecified: Secondary | ICD-10-CM

## 2023-02-19 ENCOUNTER — Encounter: Payer: Self-pay | Admitting: Pulmonary Disease

## 2023-02-19 ENCOUNTER — Ambulatory Visit: Payer: Medicare Other | Admitting: Pulmonary Disease

## 2023-02-19 VITALS — BP 145/90 | HR 91 | Ht 68.0 in | Wt 188.0 lb

## 2023-02-19 DIAGNOSIS — R768 Other specified abnormal immunological findings in serum: Secondary | ICD-10-CM

## 2023-02-19 DIAGNOSIS — J4551 Severe persistent asthma with (acute) exacerbation: Secondary | ICD-10-CM

## 2023-02-19 DIAGNOSIS — T7840XD Allergy, unspecified, subsequent encounter: Secondary | ICD-10-CM

## 2023-02-19 NOTE — Patient Instructions (Signed)
Thank you for visiting Dr. Tonia Brooms at [facility]. Today we recommend the following:  Continue trelegy and dupixent   Return in about 6 months (around 08/20/2023) for with APP or Dr. Tonia Brooms.    Please do your part to reduce the spread of COVID-19.

## 2023-02-19 NOTE — Progress Notes (Signed)
Synopsis: Referred in May 2024 for asthma by Etta Grandchild, MD  Subjective:   PATIENT ID: Edwin Brown GENDER: male DOB: 03/06/1956, MRN: [id number]  Chief Complaint  Patient presents with   Follow-up    F/u dupixent, pt states he has no concerns been doing really well. Recently had a cold, still dealing with cough and chest congestion     This is a 67 year old gentleman past medical history of asthma, depression, hypertension, hyperlipidemia.  Patient has a longstanding history of asthma.  Currently on Trelegy 100.  He does feel like his symptoms is better managed.  At one time he was having weekly nocturnal symptoms.  He had this entire life.  He have not had as a child.  He worked as a Optometrist for many years.  He does notice that he has worse symptoms after eating fried foods.  No other significant reflux symptoms that he has noticed.  He is not on Singulair.  But he does have a history of sinus issues and symptoms.  He has had nasal sinus surgery in the past with Dr. Annalee Genta.  OV 09/27/2022: Here today for follow-up.  Recently had another flare this past weekend chest tightness eyes watering wheezing cough sputum production.  Still using his triple therapy inhaler regimen plus his antihistamine and Singulair.  Overall was feeling better shortly but has still having ongoing respiratory symptoms labs were completed last office visit showed elevated eosinophil count 200 and a IgE level of greater than 225.  OV 02/19/2023: Here today for follow-up.  Doing well since starting Dupixent.  Had an upper respiratory tract infection that he got from his grandson and wife.  He is recovering from that having his mask on the day.  Overall feels better.  He has been able to sleep better at night.  He has had less chest symptoms.  Less wheezing.  Has been tolerating the injections well.  Thankful to be feeling so much better.     Past Medical History:  Diagnosis Date   Asthma    Depression    HTN  (hypertension)    Hyperlipidemia    Syncope      Family History  Problem Relation Age of Onset   Heart attack Father    Heart attack Brother 32     Past Surgical History:  Procedure Laterality Date   APPENDECTOMY     CHOLECYSTECTOMY N/A 01/30/2014   Procedure: LAPAROSCOPIC CHOLECYSTECTOMY WITH INTRAOPERATIVE CHOLANGIOGRAM;  Surgeon: Valarie Merino, MD;  Location: [facility];  Service: General;  Laterality: N/A;   NASAL SINUS SURGERY     scaphoidectomy     VASECTOMY      Social History   Socioeconomic History   Marital status: Married    Spouse name: Not on file   Number of children: 3   Years of education: 15   Highest education level: Not on file  Occupational History   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  Tobacco Use   Smoking status: Never   Smokeless tobacco: Never  Vaping Use   Vaping status: Never Used  Substance and Sexual Activity   Alcohol use: Yes    Alcohol/week: 25.0 standard drinks of alcohol    Types: 25 Cans of beer per week    Comment: 3/4 per day   Drug use: No   Sexual activity: Yes    Partners: Female  Other Topics Concern   Not on file  Social History Narrative  Right handed   Drinks caffeine   Two story home   Retired nicely   Lives in home with wife   Social Determinants of Health   Financial Resource Strain: Low Risk  (09/07/2022)   Overall Financial Resource Strain (CARDIA)    Difficulty of Paying Living Expenses: Not hard at all  Food Insecurity: Low Risk  (10/17/2022)   Received from [facility]   Hunger Vital Sign    Worried About Running Out of Food in the Last Year: Never true    Ran Out of Food in the Last Year: Never true  Transportation Needs: Not on file (10/17/2022)  Physical Activity: Patient Declined (09/07/2022)   Exercise Vital Sign    Days of Exercise per Week: Patient declined    Minutes of Exercise per Session: Patient declined  Stress: No Stress Concern Present (09/07/2022)   [employer] of Occupational Health - Occupational Stress Questionnaire    Feeling of Stress : Only a little  Social Connections: Unknown (09/07/2022)   Social Connection and Isolation Panel [NHANES]    Frequency of Communication with Friends and Family: More than three times a week    Frequency of Social Gatherings with Friends and Family: More than three times a week    Attends Religious Services: Not on file    Active Member of Clubs or Organizations: Yes    Attends Banker Meetings: More than 4 times per year    Marital Status: Married  Catering manager Violence: Not At Risk (09/07/2022)   Humiliation, Afraid, Rape, and Kick questionnaire    Fear of Current or Ex-Partner: No    Emotionally Abused: No    Physically Abused: No    Sexually Abused: No     Allergies  Allergen Reactions   Dilaudid [Hydromorphone Hcl] Shortness Of Breath   Phenergan [Promethazine Hcl] Other (See Comments)    Shaky, very out of sorts    Hydromorphone Hcl    Other     HORSE SERUM TETANUS ANTI TOX : PATIENT GETS HIGH FEVER   Promethazine Hcl      Outpatient Medications Prior to Visit  Medication Sig Dispense Refill   albuterol (VENTOLIN HFA) 108 (90 Base) MCG/ACT inhaler Inhale 2 puffs into the lungs every 6 (six) hours as needed for wheezing or shortness of breath. 8 g 3   ALPRAZolam (XANAX) 0.25 MG tablet TAKE 1 TABLET BY MOUTH AT BEDTIME 90 tablet 1   amLODipine-olmesartan (AZOR) 10-40 MG tablet Take 1 tablet by mouth daily. 90 tablet 1   citalopram (CELEXA) 40 MG tablet Take 1 tablet (40 mg total) by mouth daily. 90 tablet 1   Dupilumab (DUPIXENT) 200 MG/1. SOPN Inject 200 mg into the skin every 14 (fourteen) days. 6.84 mL 1   glucosamine-chondroitin 500-400 MG tablet Take 1 tablet by mouth 3 (three) times daily.     loratadine (CLARITIN) 10 MG tablet Take 10 mg by mouth daily as needed for allergies.     Methylsulfonylmethane (MSM) 1000 MG CAPS Take by mouth.     montelukast  (SINGULAIR) 10 MG tablet Take 1 tablet (10 mg total) by mouth at bedtime. 30 tablet 11   prazosin (MINIPRESS) 1 MG capsule Take 1 capsule (1 mg total) by mouth at bedtime. 90 capsule 1   rosuvastatin (CRESTOR) 10 MG tablet Take 1 tablet by mouth once daily 90 tablet 0   TRELEGY ELLIPTA 100-62.5-25 MCG/ACT AEPB INHALE 1 PUFF BY MOUTH INTO THE LUNGS ONCE DAILY 120 each 0  Turmeric Curcumin 500 MG CAPS Take 1,000 mg by mouth daily.     No facility-administered medications prior to visit.    Review of Systems  Constitutional:  Negative for chills, fever, malaise/fatigue and weight loss.  HENT:  Negative for hearing loss, sore throat and tinnitus.   Eyes:  Negative for blurred vision and double vision.  Respiratory:  Positive for cough and shortness of breath. Negative for hemoptysis, sputum production, wheezing and stridor.   Cardiovascular:  Negative for chest pain, palpitations, orthopnea, leg swelling and PND.  Gastrointestinal:  Negative for abdominal pain, constipation, diarrhea, heartburn, nausea and vomiting.  Genitourinary:  Negative for dysuria, hematuria and urgency.  Musculoskeletal:  Negative for joint pain and myalgias.  Skin:  Negative for itching and rash.  Neurological:  Negative for dizziness, tingling, weakness and headaches.  Endo/Heme/Allergies:  Negative for environmental allergies. Does not bruise/bleed easily.  Psychiatric/Behavioral:  Negative for depression. The patient is not nervous/anxious and does not have insomnia.   All other systems reviewed and are negative.    Objective:  Physical Exam Vitals reviewed.  Constitutional:      General: He is not in acute distress.    Appearance: He is well-developed.  HENT:     Head: Normocephalic and atraumatic.  Eyes:     General: No scleral icterus.    Conjunctiva/sclera: Conjunctivae normal.     Pupils: Pupils are equal, round, and reactive to light.  Neck:     Vascular: No JVD.     Trachea: No tracheal  deviation.  Cardiovascular:     Rate and Rhythm: Normal rate and regular rhythm.     Heart sounds: Normal heart sounds. No murmur heard. Pulmonary:     Effort: Pulmonary effort is normal. No tachypnea, accessory muscle usage or respiratory distress.     Breath sounds: No stridor. No wheezing, rhonchi or rales.     Comments: No wheezing Abdominal:     General: There is no distension.     Palpations: Abdomen is soft.     Tenderness: There is no abdominal tenderness.  Musculoskeletal:        General: No tenderness.     Cervical back: Neck supple.  Lymphadenopathy:     Cervical: No cervical adenopathy.  Skin:    General: Skin is warm and dry.     Capillary Refill: Capillary refill takes less than 2 seconds.     Findings: No rash.  Neurological:     Mental Status: He is alert and oriented to person, place, and time.  Psychiatric:        Behavior: Behavior normal.      Vitals:   02/19/23 1129  BP: (!) 145/90  Pulse: 91  SpO2: 98%  Weight: 188 lb (85.3 kg)  Height: 5\' 8"  (1.727 m)     98% on RA BMI Readings from Last 3 Encounters:  02/19/23 28.59 kg/m  12/06/22 29.35 kg/m  09/27/22 29.16 kg/m   Wt Readings from Last 3 Encounters:  02/19/23 188 lb (85.3 kg)  12/06/22 193 lb (87.5 kg)  09/27/22 191 lb 12.8 oz (87 kg)     CBC    Component Value Date/Time   WBC 4.6 07/19/2022 1003   RBC 4.56 07/19/2022 1003   HGB 13.8 07/19/2022 1003   HCT 40.5 07/19/2022 1003   PLT 305.0 07/19/2022 1003   MCV 88.9 07/19/2022 1003   MCH 29.7 01/31/2014 0550   MCHC 34.2 07/19/2022 1003   RDW 13.0 07/19/2022 1003   LYMPHSABS  1.6 07/19/2022 1003   MONOABS 0.4 07/19/2022 1003   EOSABS 0.2 07/19/2022 1003   BASOSABS 0.1 07/19/2022 1003     Chest Imaging:  No recent chest imaging  Pulmonary Functions Testing Results:    Latest Ref Rng & Units 09/26/2022    3:27 PM  PFT Results  FVC-Pre L 4.22   FVC-Predicted Pre % 101   FVC-Post L 4.41   FVC-Predicted Post % 105    Pre FEV1/FVC % % 74   Post FEV1/FCV % % 76   FEV1-Pre L 3.12   FEV1-Predicted Pre % 101   FEV1-Post L 3.36   DLCO uncorrected ml/min/mmHg 29.53   DLCO UNC% % 119   DLCO corrected ml/min/mmHg 29.53   DLCO COR %Predicted % 119   DLVA Predicted % 129   TLC L 6.82   TLC % Predicted % 103   RV % Predicted % 141     FeNO:   Pathology:   Echocardiogram:   Heart Catheterization:     Assessment & Plan:     ICD-10-CM   1. Severe persistent asthma with exacerbation  J45.51     2. Allergy, subsequent encounter  T78.40XD     3. Elevated IgE level  R76.8       Discussion:  This is a 67 year old gentleman longstanding history of asthma symptoms, severe asthma with multiple exacerbations.  Started Dupixent.  Symptoms have subsequently resolved.  Currently on Trelegy plus Singulair and antihistamines.  Plan: Continue Dupixent injections Continue Trelegy Continue albuterol as needed Continue Singulair  Follow-up in clinic in 6 months or as needed.   Current Outpatient Medications:    albuterol (VENTOLIN HFA) 108 (90 Base) MCG/ACT inhaler, Inhale 2 puffs into the lungs every 6 (six) hours as needed for wheezing or shortness of breath., Disp: 8 g, Rfl: 3   ALPRAZolam (XANAX) 0.25 MG tablet, TAKE 1 TABLET BY MOUTH AT BEDTIME, Disp: 90 tablet, Rfl: 1   amLODipine-olmesartan (AZOR) 10-40 MG tablet, Take 1 tablet by mouth daily., Disp: 90 tablet, Rfl: 1   citalopram (CELEXA) 40 MG tablet, Take 1 tablet (40 mg total) by mouth daily., Disp: 90 tablet, Rfl: 1   Dupilumab (DUPIXENT) 200 MG/1. SOPN, Inject 200 mg into the skin every 14 (fourteen) days., Disp: 6.84 mL, Rfl: 1   glucosamine-chondroitin 500-400 MG tablet, Take 1 tablet by mouth 3 (three) times daily., Disp: , Rfl:    loratadine (CLARITIN) 10 MG tablet, Take 10 mg by mouth daily as needed for allergies., Disp: , Rfl:    Methylsulfonylmethane (MSM) 1000 MG CAPS, Take by mouth., Disp: , Rfl:    montelukast (SINGULAIR) 10  MG tablet, Take 1 tablet (10 mg total) by mouth at bedtime., Disp: 30 tablet, Rfl: 11   prazosin (MINIPRESS) 1 MG capsule, Take 1 capsule (1 mg total) by mouth at bedtime., Disp: 90 capsule, Rfl: 1   rosuvastatin (CRESTOR) 10 MG tablet, Take 1 tablet by mouth once daily, Disp: 90 tablet, Rfl: 0   TRELEGY ELLIPTA 100-62.5-25 MCG/ACT AEPB, INHALE 1 PUFF BY MOUTH INTO THE LUNGS ONCE DAILY, Disp: 120 each, Rfl: 0   Turmeric Curcumin 500 MG CAPS, Take 1,000 mg by mouth daily., Disp: , Rfl:    Josephine Igo, DO [facility] 02/19/2023 11:41 AM

## 2023-03-08 ENCOUNTER — Other Ambulatory Visit: Payer: Self-pay | Admitting: Internal Medicine

## 2023-03-08 DIAGNOSIS — J4540 Moderate persistent asthma, uncomplicated: Secondary | ICD-10-CM

## 2023-03-08 DIAGNOSIS — I1A0 Resistant hypertension: Secondary | ICD-10-CM

## 2023-03-29 ENCOUNTER — Telehealth: Payer: Self-pay | Admitting: Pulmonary Disease

## 2023-03-29 DIAGNOSIS — J4551 Severe persistent asthma with (acute) exacerbation: Secondary | ICD-10-CM

## 2023-03-29 MED ORDER — DUPIXENT 200 MG/1.14ML ~~LOC~~ SOAJ: 200.0000 mg | SUBCUTANEOUS | 1 refills | Status: DC

## 2023-03-29 NOTE — Telephone Encounter (Signed)
Patient states needs refill for Dupixent. Patient phone number is [phone].

## 2023-03-29 NOTE — Telephone Encounter (Signed)
Refill for Dupixent sent to [facility]  Last OV: 02/19/2023 Provider: Dr. Tonia Brooms (will need to find new provider)

## 2023-05-04 LAB — PSA: PSA: 3.2

## 2023-05-11 ENCOUNTER — Telehealth: Payer: Self-pay | Admitting: Pulmonary Disease

## 2023-05-11 NOTE — Telephone Encounter (Signed)
 Patient needs for his dupixent application to be submitted for this year. Patient can be reached at [phone]

## 2023-05-22 NOTE — Telephone Encounter (Signed)
 Patient is calling for an update on his Dupixent application. He hasn't heard anything from our office or Dupixent.

## 2023-05-31 NOTE — Telephone Encounter (Signed)
 Patient is still waiting on an update for his dupixent application.

## 2023-06-01 NOTE — Telephone Encounter (Signed)
 [employer] enrollment form printed. ATC pt to determine if he's completed his part of form. Unable to reach. Left VM  Chesley Mires, PharmD, MPH, BCPS, CPP Clinical Pharmacist (Rheumatology and Pulmonology)

## 2023-06-02 ENCOUNTER — Other Ambulatory Visit: Payer: Self-pay | Admitting: Internal Medicine

## 2023-06-02 DIAGNOSIS — E785 Hyperlipidemia, unspecified: Secondary | ICD-10-CM

## 2023-06-04 ENCOUNTER — Telehealth: Payer: Self-pay

## 2023-06-04 NOTE — Telephone Encounter (Signed)
 Patient stopping by tomorrow morning to sign form. Placed in file cabinet up front.  Prescriber form signed  Chesley Mires, PharmD, MPH, BCPS, CPP Clinical Pharmacist (Rheumatology and Pulmonology)

## 2023-06-04 NOTE — Telephone Encounter (Signed)
 Attempted to submit a Prior Authorization request to [facility] for DUPIXENT via CoverMyMeds, however request was canceled stating that there is currently an approval on file from 03/14/2023 to 03/12/2024.  Key: [id number] Auth# [id number]

## 2023-06-05 ENCOUNTER — Encounter: Payer: Self-pay | Admitting: Internal Medicine

## 2023-06-05 ENCOUNTER — Ambulatory Visit: Payer: Medicare Other | Admitting: Internal Medicine

## 2023-06-05 VITALS — BP 138/78 | HR 88 | Temp 98.0°F | Resp 16 | Ht 68.0 in | Wt 186.2 lb

## 2023-06-05 DIAGNOSIS — I10 Essential (primary) hypertension: Secondary | ICD-10-CM

## 2023-06-05 DIAGNOSIS — E669 Obesity, unspecified: Secondary | ICD-10-CM | POA: Insufficient documentation

## 2023-06-05 DIAGNOSIS — K5730 Diverticulosis of large intestine without perforation or abscess without bleeding: Secondary | ICD-10-CM | POA: Insufficient documentation

## 2023-06-05 DIAGNOSIS — F1024 Alcohol dependence with alcohol-induced mood disorder: Secondary | ICD-10-CM | POA: Diagnosis not present

## 2023-06-05 DIAGNOSIS — E785 Hyperlipidemia, unspecified: Secondary | ICD-10-CM | POA: Diagnosis not present

## 2023-06-05 DIAGNOSIS — Z860100 Personal history of colon polyps, unspecified: Secondary | ICD-10-CM | POA: Insufficient documentation

## 2023-06-05 LAB — CBC WITH DIFFERENTIAL/PLATELET
Basophils Absolute: 0.1 10*3/uL (ref 0.0–0.1)
Basophils Relative: 2.8 % (ref 0.0–3.0)
Eosinophils Absolute: 0.2 10*3/uL (ref 0.0–0.7)
Eosinophils Relative: 4.3 % (ref 0.0–5.0)
HCT: 41.2 % (ref 39.0–52.0)
Hemoglobin: 13.6 g/dL (ref 13.0–17.0)
Lymphocytes Relative: 38.7 % (ref 12.0–46.0)
Lymphs Abs: 1.7 10*3/uL (ref 0.7–4.0)
MCHC: 33.1 g/dL (ref 30.0–36.0)
MCV: 88.5 fl (ref 78.0–100.0)
Monocytes Absolute: 0.4 10*3/uL (ref 0.1–1.0)
Monocytes Relative: 8.8 % (ref 3.0–12.0)
Neutro Abs: 2 10*3/uL (ref 1.4–7.7)
Neutrophils Relative %: 45.4 % (ref 43.0–77.0)
Platelets: 311 10*3/uL (ref 150.0–400.0)
RBC: 4.65 Mil/uL (ref 4.22–5.81)
RDW: 14.1 % (ref 11.5–15.5)
WBC: 4.3 10*3/uL (ref 4.0–10.5)

## 2023-06-05 LAB — HEPATIC FUNCTION PANEL
ALT: 27 U/L (ref 0–53)
AST: 26 U/L (ref 0–37)
Albumin: 4.4 g/dL (ref 3.5–5.2)
Alkaline Phosphatase: 62 U/L (ref 39–117)
Bilirubin, Direct: 0.1 mg/dL (ref 0.0–0.3)
Total Bilirubin: 0.5 mg/dL (ref 0.2–1.2)
Total Protein: 7 g/dL (ref 6.0–8.3)

## 2023-06-05 LAB — BASIC METABOLIC PANEL
BUN: 13 mg/dL (ref 6–23)
CO2: 29 meq/L (ref 19–32)
Calcium: 10.2 mg/dL (ref 8.4–10.5)
Chloride: 102 meq/L (ref 96–112)
Creatinine, Ser: 0.82 mg/dL (ref 0.40–1.50)
GFR: 89.87 mL/min (ref >60.00)
Glucose, Bld: 97 mg/dL (ref 70–99)
Potassium: 4.2 meq/L (ref 3.5–5.1)
Sodium: 136 meq/L (ref 135–145)

## 2023-06-05 LAB — LIPID PANEL
Cholesterol: 145 mg/dL (ref 0–200)
HDL: 64.4 mg/dL (ref >39.00)
LDL Cholesterol: 60 mg/dL (ref 0–99)
NonHDL: 80.8
Total CHOL/HDL Ratio: 2
Triglycerides: 104 mg/dL (ref 0.0–149.0)
VLDL: 20.8 mg/dL (ref 0.0–40.0)

## 2023-06-05 NOTE — Patient Instructions (Signed)
 Hypertension, Adult High blood pressure (hypertension) is when the force of blood pumping through the arteries is too strong. The arteries are the blood vessels that carry blood from the heart throughout the body. Hypertension forces the heart to work harder to pump blood and may cause arteries to become narrow or stiff. Untreated or uncontrolled hypertension can lead to a heart attack, heart failure, a stroke, kidney disease, and other problems. A blood pressure reading consists of a higher number over a lower number. Ideally, your blood pressure should be below 120/80. The first ("top") number is called the systolic pressure. It is a measure of the pressure in your arteries as your heart beats. The second ("bottom") number is called the diastolic pressure. It is a measure of the pressure in your arteries as the heart relaxes. What are the causes? The exact cause of this condition is not known. There are some conditions that result in high blood pressure. What increases the risk? Certain factors may make you more likely to develop high blood pressure. Some of these risk factors are under your control, including: Smoking. Not getting enough exercise or physical activity. Being overweight. Having too much fat, sugar, calories, or salt (sodium) in your diet. Drinking too much alcohol. Other risk factors include: Having a personal history of heart disease, diabetes, high cholesterol, or kidney disease. Stress. Having a family history of high blood pressure and high cholesterol. Having obstructive sleep apnea. Age. The risk increases with age. What are the signs or symptoms? High blood pressure may not cause symptoms. Very high blood pressure (hypertensive crisis) may cause: Headache. Fast or irregular heartbeats (palpitations). Shortness of breath. Nosebleed. Nausea and vomiting. Vision changes. Severe chest pain, dizziness, and seizures. How is this diagnosed? This condition is diagnosed by  measuring your blood pressure while you are seated, with your arm resting on a flat surface, your legs uncrossed, and your feet flat on the floor. The cuff of the blood pressure monitor will be placed directly against the skin of your upper arm at the level of your heart. Blood pressure should be measured at least twice using the same arm. Certain conditions can cause a difference in blood pressure between your right and left arms. If you have a high blood pressure reading during one visit or you have normal blood pressure with other risk factors, you may be asked to: Return on a different day to have your blood pressure checked again. Monitor your blood pressure at home for 1 week or longer. If you are diagnosed with hypertension, you may have other blood or imaging tests to help your health care provider understand your overall risk for other conditions. How is this treated? This condition is treated by making healthy lifestyle changes, such as eating healthy foods, exercising more, and reducing your alcohol intake. You may be referred for counseling on a healthy diet and physical activity. Your health care provider may prescribe medicine if lifestyle changes are not enough to get your blood pressure under control and if: Your systolic blood pressure is above 130. Your diastolic blood pressure is above 80. Your personal target blood pressure may vary depending on your medical conditions, your age, and other factors. Follow these instructions at home: Eating and drinking  Eat a diet that is high in fiber and potassium, and low in sodium, added sugar, and fat. An example of this eating plan is called the DASH diet. DASH stands for Dietary Approaches to Stop Hypertension. To eat this way: Eat  plenty of fresh fruits and vegetables. Try to fill one half of your plate at each meal with fruits and vegetables. Eat whole grains, such as whole-wheat pasta, brown rice, or whole-grain bread. Fill about one  fourth of your plate with whole grains. Eat or drink low-fat dairy products, such as skim milk or low-fat yogurt. Avoid fatty cuts of meat, processed or cured meats, and poultry with skin. Fill about one fourth of your plate with lean proteins, such as fish, chicken without skin, beans, eggs, or tofu. Avoid pre-made and processed foods. These tend to be higher in sodium, added sugar, and fat. Reduce your daily sodium intake. Many people with hypertension should eat less than 1,500 mg of sodium a day. Do not drink alcohol if: Your health care provider tells you not to drink. You are pregnant, may be pregnant, or are planning to become pregnant. If you drink alcohol: Limit how much you have to: 0-1 drink a day for women. 0-2 drinks a day for men. Know how much alcohol is in your drink. In the [place] one drink equals one 12 oz bottle of beer (355 mL), one 5 oz glass of wine (148 mL), or one 1 oz glass of hard liquor (44 mL). Lifestyle  Work with your health care provider to maintain a healthy body weight or to lose weight. Ask what an ideal weight is for you. Get at least 30 minutes of exercise that causes your heart to beat faster (aerobic exercise) most days of the week. Activities may include walking, swimming, or biking. Include exercise to strengthen your muscles (resistance exercise), such as Pilates or lifting weights, as part of your weekly exercise routine. Try to do these types of exercises for 30 minutes at least 3 days a week. Do not use any products that contain nicotine or tobacco. These products include cigarettes, chewing tobacco, and vaping devices, such as e-cigarettes. If you need help quitting, ask your health care provider. Monitor your blood pressure at home as told by your health care provider. Keep all follow-up visits. This is important. Medicines Take over-the-counter and prescription medicines only as told by your health care provider. Follow directions carefully. Blood  pressure medicines must be taken as prescribed. Do not skip doses of blood pressure medicine. Doing this puts you at risk for problems and can make the medicine less effective. Ask your health care provider about side effects or reactions to medicines that you should watch for. Contact a health care provider if you: Think you are having a reaction to a medicine you are taking. Have headaches that keep coming back (recurring). Feel dizzy. Have swelling in your ankles. Have trouble with your vision. Get help right away if you: Develop a severe headache or confusion. Have unusual weakness or numbness. Feel faint. Have severe pain in your chest or abdomen. Vomit repeatedly. Have trouble breathing. These symptoms may be an emergency. Get help right away. Call 911. Do not wait to see if the symptoms will go away. Do not drive yourself to the hospital. Summary Hypertension is when the force of blood pumping through your arteries is too strong. If this condition is not controlled, it may put you at risk for serious complications. Your personal target blood pressure may vary depending on your medical conditions, your age, and other factors. For most people, a normal blood pressure is less than 120/80. Hypertension is treated with lifestyle changes, medicines, or a combination of both. Lifestyle changes include losing weight, eating a healthy,  low-sodium diet, exercising more, and limiting alcohol. This information is not intended to replace advice given to you by your health care provider. Make sure you discuss any questions you have with your health care provider. Document Revised: 01/04/2021 Document Reviewed: 01/04/2021 Elsevier Patient Education  2024 [employer].

## 2023-06-05 NOTE — Telephone Encounter (Signed)
 Approval letter received and sent to media tab.  Patient portion was left in file cabinet up front for patient to sign. He was planning to stop by clinic today
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
## 2023-06-06 NOTE — Telephone Encounter (Signed)
 Submitted Patient Assistance Application to [employer] for DUPIXENT along with provider portion, patient portion, PA, medication list, and insurance card copies. Will update patient when we receive a response.  Phone #: [phone] Fax #: [phone]

## 2023-06-09 LAB — VITAMIN B1: Vitamin B1 (Thiamine): 10 nmol/L (ref 8–30)

## 2023-06-10 ENCOUNTER — Encounter: Payer: Self-pay | Admitting: Internal Medicine

## 2023-06-12 NOTE — Telephone Encounter (Signed)
 Received a fax from  [employer] regarding an approval for DUPIXENT patient assistance from 06/12/2023 to 03/12/2024. Approval letter sent to scan center.  Phone #: [phone] Fax #: [phone]

## 2023-06-18 ENCOUNTER — Other Ambulatory Visit: Payer: Self-pay | Admitting: Internal Medicine

## 2023-06-18 DIAGNOSIS — I1A0 Resistant hypertension: Secondary | ICD-10-CM

## 2023-07-03 ENCOUNTER — Other Ambulatory Visit: Payer: Self-pay | Admitting: Internal Medicine

## 2023-07-03 DIAGNOSIS — F514 Sleep terrors [night terrors]: Secondary | ICD-10-CM

## 2023-07-05 ENCOUNTER — Ambulatory Visit
Admission: EM | Admit: 2023-07-05 | Discharge: 2023-07-05 | Disposition: A | Discharge status: Home / Self Care | Attending: Physician Assistant | Admitting: Physician Assistant

## 2023-07-05 ENCOUNTER — Other Ambulatory Visit: Payer: Self-pay

## 2023-07-05 DIAGNOSIS — J029 Acute pharyngitis, unspecified: Secondary | ICD-10-CM | POA: Insufficient documentation

## 2023-07-05 DIAGNOSIS — J069 Acute upper respiratory infection, unspecified: Secondary | ICD-10-CM | POA: Diagnosis present

## 2023-07-05 LAB — POC COVID19/FLU A&B COMBO
Covid Antigen, POC: NEGATIVE
Influenza A Antigen, POC: NEGATIVE
Influenza B Antigen, POC: NEGATIVE

## 2023-07-05 LAB — POCT RAPID STREP A (OFFICE): Rapid Strep A Screen: NEGATIVE

## 2023-07-05 NOTE — ED Provider Notes (Signed)
 Edwin Brown    CSN: [id number] Arrival date & time: 07/05/23  1215      History   Chief Complaint Chief Complaint  Patient presents with   Cough   Sore Throat    Edwin Brown is a 68 y.o. male.   Edwin  He reports he has been having sore throat,productive cough for about a week  He states that when he looked at his throat this AM he noticed white lesions and is concerned for strep   He reports his grandson was visiting last week and had similar symptoms. Grandson was not dx with strep to his knowledge   Interventions: he has been taking Claritin , Ibuprofen, warm tea  He has been taking claritin  and flonase since spring started    Past Medical History:  Diagnosis Date   Asthma    Depression    HTN (hypertension)    Hyperlipidemia    Syncope     Patient Active Problem List   Diagnosis Date Noted   Diverticulosis of large intestine without perforation or abscess without bleeding 06/05/2023   History of colonic polyps 06/05/2023   Alcohol dependence with alcohol-induced mood disorder (HCC) 12/08/2022   Encounter for general adult medical examination with abnormal findings 12/06/2022   Flu vaccine need 12/06/2022   Need for prophylactic vaccination and inoculation against varicella 12/06/2022   Elevated PSA 12/06/2022   Moderate episode of recurrent major depressive disorder (HCC) 12/06/2022   Degenerative disc disease, lumbar 05/22/2022   Dyslipidemia, goal LDL below 100 05/08/2022   LAFB (left anterior fascicular block) 05/08/2022   Moderate persistent asthma without complication 05/08/2022   Night terrors, adult 05/08/2022   Tremor 05/08/2022   Nonallopathic lesion of thoracic region 02/21/2019   Nonallopathic lesion of rib cage 02/21/2019   Nonallopathic lesion of lumbosacral region 02/21/2019   Nonallopathic lesion of sacral region 02/21/2019   Left carpal tunnel syndrome 03/26/2018   HTN (hypertension) 12/12/2012    Past Surgical  History:  Procedure Laterality Date   APPENDECTOMY     CHOLECYSTECTOMY N/A 01/30/2014   Procedure: LAPAROSCOPIC CHOLECYSTECTOMY WITH INTRAOPERATIVE CHOLANGIOGRAM;  Surgeon: Azucena Bollard, MD;  Location: [facility];  Service: General;  Laterality: N/A;   NASAL SINUS SURGERY     scaphoidectomy     VASECTOMY         Home Medications    Prior to Admission medications   Medication Sig Start Date End Date Taking? Authorizing Provider  albuterol  (VENTOLIN  HFA) 108 (90 Base) MCG/ACT inhaler Inhale 2 puffs into the lungs every 6 (six) hours as needed for wheezing or shortness of breath. 06/05/22   Arcadio Knuckles, MD  ALPRAZolam  (XANAX ) 0.25 MG tablet TAKE 1 TABLET BY MOUTH AT BEDTIME 11/14/22   Arcadio Knuckles, MD  amLODipine -olmesartan  (AZOR ) 10-40 MG tablet Take 1 tablet by mouth once daily 06/18/23   Arcadio Knuckles, MD  citalopram  (CELEXA ) 40 MG tablet Take 1 tablet (40 mg total) by mouth daily. 12/06/22   Arcadio Knuckles, MD  Cyanocobalamin  (B-12 PO) Take by mouth.    [provider]  Dupilumab  (DUPIXENT ) 200 MG/1. SOAJ Inject 200 mg into the skin every 14 (fourteen) days. 03/29/23   Prudy Brownie, DO  glucosamine-chondroitin 500-400 MG tablet Take 1 tablet by mouth daily in the afternoon.    [provider]  loratadine  (CLARITIN ) 10 MG tablet Take 10 mg by mouth daily as needed for allergies.    [provider]  Methylsulfonylmethane (MSM)  1000 MG CAPS Take by mouth.    [provider]  montelukast  (SINGULAIR ) 10 MG tablet Take 1 tablet (10 mg total) by mouth at bedtime. Patient not taking: Reported on 06/05/2023 07/19/22   Prudy Brownie, DO  Multiple Vitamin (MULTIVITAMIN) capsule Take 1 capsule by mouth daily.    [provider]  prazosin  (MINIPRESS ) 1 MG capsule Take 1 capsule (1 mg total) by mouth at bedtime. 12/07/22   Arcadio Knuckles, MD  rosuvastatin  (CRESTOR ) 10 MG tablet Take 1 tablet by mouth once daily 06/04/23   Arcadio Knuckles, MD   Turmeric Curcumin 500 MG CAPS Take 1,000 mg by mouth daily.    [provider]    Family History Family History  Problem Relation Age of Onset   Heart attack Father    Heart attack Brother 33    Social History Social History   Tobacco Use   Smoking status: Never   Smokeless tobacco: Never  Vaping Use   Vaping status: Never Used  Substance Use Topics   Alcohol use: Yes    Alcohol/week: 25.0 standard drinks of alcohol    Types: 25 Cans of beer per week    Comment: 3/4 per day   Drug use: No     Allergies   Dilaudid  [hydromorphone  hcl], Phenergan  [promethazine  hcl], Hydromorphone  hcl, Other, and Promethazine  hcl   Review of Systems Review of Systems  Constitutional:  Positive for fever (subjective). Negative for chills.  HENT:  Positive for postnasal drip, sore throat and voice change. Negative for congestion and rhinorrhea.   Respiratory:  Positive for cough. Negative for shortness of breath and wheezing.      Physical Exam Triage Vital Signs ED Triage Vitals  Encounter Vitals Group     BP 07/05/23 1250 (!) 143/78     Systolic BP Percentile --      Diastolic BP Percentile --      Pulse Rate 07/05/23 1250 92     Resp 07/05/23 1250 20     Temp 07/05/23 1250 98.2 F (36.8 C)     Temp Source 07/05/23 1250 Oral     SpO2 07/05/23 1250 96 %     Weight 07/05/23 1250 178 lb (80.7 kg)     Height 07/05/23 1250 5\' 8"  (1.727 m)     Head Circumference --      Peak Flow --      Pain Score 07/05/23 1321 3     Pain Loc --      Pain Education --      Exclude from Growth Chart --    No data found.  Updated Vital Signs BP (!) 143/78 (BP Location: Right Arm)   Pulse 92   Temp 98.2 F (36.8 C) (Oral)   Resp 20   Ht 5\' 8"  (1.727 m)   Wt 178 lb (80.7 kg)   SpO2 96%   BMI 27.06 kg/m   Visual Acuity Right Eye Distance:   Left Eye Distance:   Bilateral Distance:    Right Eye Near:   Left Eye Near:    Bilateral Near:     Physical Exam Vitals reviewed.   Constitutional:      General: He is awake.     Appearance: Normal appearance. He is well-developed and well-groomed.  HENT:     Head: Normocephalic and atraumatic.     Right Ear: Hearing, tympanic membrane and ear canal normal.     Left Ear: Hearing, tympanic membrane and ear canal  normal.     Mouth/Throat:     Lips: Pink.     Mouth: Mucous membranes are moist.     Pharynx: Oropharynx is clear. Uvula midline. Postnasal drip present. No pharyngeal swelling, oropharyngeal exudate, posterior oropharyngeal erythema or uvula swelling.     Tonsils: No tonsillar exudate or tonsillar abscesses.  Eyes:     General: Lids are normal. Gaze aligned appropriately.     Extraocular Movements: Extraocular movements intact.  Cardiovascular:     Rate and Rhythm: Normal rate and regular rhythm.     Heart sounds: Normal heart sounds.  Pulmonary:     Effort: Pulmonary effort is normal.     Breath sounds: Normal breath sounds. No decreased air movement. No decreased breath sounds, wheezing, rhonchi or rales.  Musculoskeletal:     Cervical back: Normal range of motion and neck supple.  Lymphadenopathy:     Head:     Right side of head: No submental, submandibular or preauricular adenopathy.     Left side of head: No submental, submandibular or preauricular adenopathy.     Cervical:     Right cervical: No superficial cervical adenopathy.    Left cervical: No superficial cervical adenopathy.     Upper Body:     Right upper body: No supraclavicular adenopathy.     Left upper body: No supraclavicular adenopathy.  Skin:    General: Skin is warm and dry.  Neurological:     General: No focal deficit present.     Mental Status: He is alert and oriented to person, place, and time.  Psychiatric:        Mood and Affect: Mood normal.        Behavior: Behavior normal. Behavior is cooperative.        Thought Content: Thought content normal.        Judgment: Judgment normal.      Brown Treatments / Results   Labs (all labs ordered are listed, but only abnormal results are displayed) Labs Reviewed  CULTURE, GROUP A STREP [facility])  POCT RAPID STREP A (OFFICE)  POC COVID19/FLU A&B COMBO    EKG   Radiology No results found.  Procedures Procedures (including critical care time)  Medications Ordered in Brown Medications - No data to display  Initial Impression / Assessment and Plan / Brown Course  I have reviewed the triage vital signs and the nursing notes.  Pertinent labs & imaging results that were available during my care of the patient were reviewed by me and considered in my medical decision making (see chart for details).      Final Clinical Impressions(s) / Brown Diagnoses   Final diagnoses:  Sore throat  Viral upper respiratory tract infection   Patient presents today with concerns for mild cough, sore throat, postnasal drainage and subjective fever.  He reports this has been ongoing since Sunday and he has had mild relief with over-the-counter medications and warm tea. Discussed likely viral etiology given short duration.  No evidence of acute infection on physical exam that would warrant initiation of antibiotics.  Flu testing negative. COVID testing negative.  Rapid strep negative.  Culture sent for definitive rule out of strep.  Recommended over-the-counter medication including Tylenol , Mucinex, Flonase.  Patient is to rest and drink plenty of fluid.  Discussed alarm symptoms that warrant emergent evaluation including high fever not responding to medication, nausea/vomiting interfering with oral intake, worsening cough, chest pain, shortness of breath.  Strict return precautions given to which patient expressed  understanding.     Discharge Instructions      Your flu and COVID tests were negative.  Your rapid strep was negative.  We have sent off a sample for culture for definitive rule out.  It will take several days for the culture results to come back and we will keep you  updated with those results once they are available. Based on your described symptoms and the duration of symptoms it is likely that you have a viral upper respiratory infection (often called a "cold")  Symptoms can last for 3-10 days with lingering cough and intermittent symptoms lasting weeks after that.  The goal of treatment at this time is to reduce your symptoms and discomfort   I recommend using Robitussin and Mucinex (regular formulations, nothing with decongestants or DM)  You can also use Tylenol  for body aches and fever reduction I also recommend adding an antihistamine to your daily regimen This includes medications like Claritin , Allegra, Zyrtec- the generics of these work very well and are usually less expensive I recommend using Flonase nasal spray - 2 puffs twice per day to help with your nasal congestion The antihistamines and Flonase can take a few weeks to provide significant relief from allergy symptoms but should start to provide some benefit soon. You can use a humidifier at night to help with preventing nasal dryness and irritation   If your symptoms are not improving or seem to be getting worse over the next 5 to 7 days you can always return here to urgent care or you can follow-up with your primary care provider for ongoing management Go to the ER if you begin to have more serious symptoms such as shortness of breath, trouble breathing, loss of consciousness, swelling around the eyes, high fever, severe lasting headaches, vision changes or neck pain/stiffness.       ED Prescriptions   None    PDMP not reviewed this encounter.   Jerona Mooring, PA-C 07/05/23 1357

## 2023-07-05 NOTE — ED Triage Notes (Signed)
 Pt presents with complaints of sore throat and productive cough since Sunday 4/20. Pt states the back of his throat has white lesions. Pt currently rates his overall pain a 3/10. OTC Ibuprofen taken with some relief. Unsure of fevers at home, did not take temperature. Pt did feel warm when symptoms began on Sunday.

## 2023-07-05 NOTE — Discharge Instructions (Addendum)
 Your flu and COVID tests were negative.  Your rapid strep was negative.  We have sent off a sample for culture for definitive rule out.  It will take several days for the culture results to come back and we will keep you updated with those results once they are available. Based on your described symptoms and the duration of symptoms it is likely that you have a viral upper respiratory infection (often called a "cold")  Symptoms can last for 3-10 days with lingering cough and intermittent symptoms lasting weeks after that.  The goal of treatment at this time is to reduce your symptoms and discomfort   I recommend using Robitussin and Mucinex (regular formulations, nothing with decongestants or DM)  You can also use Tylenol  for body aches and fever reduction I also recommend adding an antihistamine to your daily regimen This includes medications like Claritin , Allegra, Zyrtec- the generics of these work very well and are usually less expensive I recommend using Flonase nasal spray - 2 puffs twice per day to help with your nasal congestion The antihistamines and Flonase can take a few weeks to provide significant relief from allergy symptoms but should start to provide some benefit soon. You can use a humidifier at night to help with preventing nasal dryness and irritation   If your symptoms are not improving or seem to be getting worse over the next 5 to 7 days you can always return here to urgent care or you can follow-up with your primary care provider for ongoing management Go to the ER if you begin to have more serious symptoms such as shortness of breath, trouble breathing, loss of consciousness, swelling around the eyes, high fever, severe lasting headaches, vision changes or neck pain/stiffness.

## 2023-07-06 ENCOUNTER — Ambulatory Visit: Admitting: Family Medicine

## 2023-07-06 NOTE — Progress Notes (Deleted)
   Acute Office Visit  Subjective:     Patient ID: Edwin Brown, male    DOB: [birth year], 68 y.o.   MRN: [id number]  No chief complaint on file.   HPI Patient is in today for evaluation of ***, for the last ***. Has tried Denies known sick contacts, Denies abdominal pain, nausea, vomiting, diarrhea, rash, fever, chills, other symptoms.  Medical hx as outlined below.  ROS Per HPI      Objective:    There were no vitals taken for this visit.   Physical Exam Vitals and nursing note reviewed.  Constitutional:      General: He is not in acute distress.    Comments: Appears fatigued  HENT:     Head: Normocephalic and atraumatic.     Right Ear: Tympanic membrane and ear canal normal.     Left Ear: Tympanic membrane and ear canal normal.     Nose: Congestion present.     Mouth/Throat:     Mouth: Mucous membranes are moist.     Pharynx: Oropharynx is clear. No oropharyngeal exudate or posterior oropharyngeal erythema.     Comments: Oropharyngeal cobblestoning   Eyes:     Extraocular Movements: Extraocular movements intact.  Cardiovascular:     Rate and Rhythm: Normal rate and regular rhythm.     Heart sounds: Normal heart sounds.  Pulmonary:     Effort: Pulmonary effort is normal. No respiratory distress.     Breath sounds: No wheezing, rhonchi or rales.  Musculoskeletal:     Cervical back: Normal range of motion.  Lymphadenopathy:     Cervical: Cervical adenopathy present.  Neurological:     General: No focal deficit present.     Mental Status: He is alert and oriented to person, place, and time.   No results found for any visits on 07/06/23.      Assessment & Plan:   There are no diagnoses linked to this encounter.   No orders of the defined types were placed in this encounter.   No follow-ups on file.  Wellington Half, FNP

## 2023-07-08 LAB — CULTURE, GROUP A STREP (THRC)

## 2023-07-13 ENCOUNTER — Encounter: Admitting: Pulmonary Disease

## 2023-07-13 NOTE — Progress Notes (Signed)
Negative strep culture.

## 2023-07-17 ENCOUNTER — Other Ambulatory Visit: Payer: Self-pay | Admitting: Internal Medicine

## 2023-07-17 DIAGNOSIS — F514 Sleep terrors [night terrors]: Secondary | ICD-10-CM

## 2023-07-18 ENCOUNTER — Encounter: Payer: Self-pay | Admitting: Internal Medicine

## 2023-07-23 NOTE — Telephone Encounter (Signed)
 Copied from CRM [id number]. Topic: Clinical - Medication Question >> Jul 23, 2023 12:37 PM Adonis Hoot wrote: Reason for CRM: Patient called in to ask about status of medications   ALPRAZolam  (XANAX ) 0.25 MG tablet  and  prazosin  (MINIPRESS ) 1 MG capsule Being sent to pharmacy for him.

## 2023-07-24 ENCOUNTER — Other Ambulatory Visit: Payer: Self-pay

## 2023-07-24 DIAGNOSIS — F514 Sleep terrors [night terrors]: Secondary | ICD-10-CM

## 2023-07-24 MED ORDER — PRAZOSIN HCL 1 MG PO CAPS: 1.0000 mg | ORAL_CAPSULE | Freq: Every day | ORAL | 1 refills | Status: DC

## 2023-08-28 ENCOUNTER — Telehealth: Payer: Self-pay | Admitting: Pharmacist

## 2023-08-28 DIAGNOSIS — J4551 Severe persistent asthma with (acute) exacerbation: Secondary | ICD-10-CM

## 2023-08-28 MED ORDER — DUPIXENT 200 MG/1.14ML ~~LOC~~ SOAJ: 200.0000 mg | SUBCUTANEOUS | 0 refills | Status: DC

## 2023-08-28 NOTE — Telephone Encounter (Signed)
 Refill sent for DUPIXENT  to [facility]: [phone]  Dose: 200mg  subcut every 14 days  Last OV: 02/19/23 Provider: Dr. Thelda Finney  Next OV: due (needs to establish with new provider - MyChart message sent to patient  Routing to scheduling team for follow-up on appt scheduling  Geraldene Kleine, PharmD, MPH, BCPS Clinical Pharmacist (Rheumatology and Pulmonology)

## 2023-08-28 NOTE — Telephone Encounter (Signed)
 Patient is now scheduled on 9/2 with Sueanne Emerald.

## 2023-09-13 ENCOUNTER — Ambulatory Visit: Payer: Medicare Other

## 2023-09-21 ENCOUNTER — Other Ambulatory Visit: Payer: Self-pay | Admitting: Internal Medicine

## 2023-09-21 DIAGNOSIS — I1A0 Resistant hypertension: Secondary | ICD-10-CM

## 2023-11-13 ENCOUNTER — Ambulatory Visit: Admitting: Primary Care

## 2023-11-13 DIAGNOSIS — J4540 Moderate persistent asthma, uncomplicated: Secondary | ICD-10-CM

## 2023-11-14 ENCOUNTER — Telehealth: Payer: Self-pay | Admitting: Primary Care

## 2023-11-14 ENCOUNTER — Ambulatory Visit: Admitting: Primary Care

## 2023-11-14 ENCOUNTER — Encounter: Payer: Self-pay | Admitting: Primary Care

## 2023-11-14 VITALS — BP 132/70 | HR 87 | Temp 97.5°F | Ht 68.0 in | Wt 188.2 lb

## 2023-11-14 DIAGNOSIS — J4540 Moderate persistent asthma, uncomplicated: Secondary | ICD-10-CM

## 2023-11-14 DIAGNOSIS — J4551 Severe persistent asthma with (acute) exacerbation: Secondary | ICD-10-CM

## 2023-11-14 MED ORDER — DUPIXENT 200 MG/1.14ML ~~LOC~~ SOAJ: 200.0000 mg | SUBCUTANEOUS | 1 refills | Status: AC

## 2023-11-14 MED ORDER — FLUTICASONE-SALMETEROL 250-50 MCG/ACT IN AEPB: 1.0000 | INHALATION_SPRAY | Freq: Two times a day (BID) | RESPIRATORY_TRACT | 11 refills | Status: AC

## 2023-11-14 MED ORDER — ALBUTEROL SULFATE HFA 108 (90 BASE) MCG/ACT IN AERS: 2.0000 | INHALATION_SPRAY | Freq: Four times a day (QID) | RESPIRATORY_TRACT | 3 refills | Status: AC | PRN

## 2023-11-14 MED ORDER — MONTELUKAST SODIUM 10 MG PO TABS: 10.0000 mg | ORAL_TABLET | Freq: Every day | ORAL | 11 refills | Status: AC

## 2023-11-14 NOTE — Progress Notes (Signed)
 @Patient  ID: Edwin Brown, male    DOB: 31-Jan-1956, 68 y.o.   MRN: [id number]  Chief Complaint  Patient presents with   Asthma    Dupixent  helps pt. No complaints     Referring provider: Joshua Debby CROME, MD  HPI: 68 year old male, never smoked. PMH significant for HTN, moderate persistent asthma, degenerative disc disease.   11/14/2023 Discussed the use of AI scribe software for clinical note transcription with the patient, who gave verbal consent to proceed.  History of Present Illness Edwin Brown is a 68 year old male with severe persistent asthma who presents for asthma management.  He has a history of severe persistent asthma and was previously on Trelegy, which he discontinued 6-8 months ago due to cost issues. Currently, he is on Dupixent , and he reports that it has helped reduce his throat clearing, coughing, and nighttime wheezing, and he is able to sleep better. He uses his albuterol  rescue inhaler less than once a day and is also on Singulair  (montelukast ), for which he needs a refill. He took his second to last dose of Dupixent  this morning and receives it via mail. He has never been hospitalized for asthma.  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 He has never smoked. He experiences some mucus production without color and uses nasal spray every night.   Allergies  Allergen Reactions   Dilaudid  [Hydromorphone  Hcl] Shortness Of Breath   Phenergan  [Promethazine  Hcl] Other (See Comments)    Shaky, very out of sorts    Hydromorphone  Hcl    Other     HORSE SERUM TETANUS ANTI TOX : PATIENT GETS HIGH FEVER   Promethazine  Hcl     Immunization History  Administered Date(s) Administered   Fluad Trivalent(High Dose 65+) 12/06/2022   Influenza-Unspecified 02/14/2022   PFIZER(Purple Top)SARS-COV-2 Vaccination 04/24/2019, 05/15/2019, 12/19/2019, 07/01/2020, 02/25/2021   PNEUMOCOCCAL CONJUGATE-20 07/23/2020   Pfizer(Comirnaty)Fall Seasonal  Vaccine 12 years and older 02/14/2022   Pneumococcal Polysaccharide-23 07/17/2019   Tdap 12/07/2016    Past Medical History:  Diagnosis Date   Asthma    Depression    HTN (hypertension)    Hyperlipidemia    Syncope     Tobacco History: Social History   Tobacco Use  Smoking Status Never  Smokeless Tobacco Never   Counseling given: Not Answered   Outpatient Medications Prior to Visit  Medication Sig Dispense Refill   albuterol  (VENTOLIN  HFA) 108 (90 Base) MCG/ACT inhaler Inhale 2 puffs into the lungs every 6 (six) hours as needed for wheezing or shortness of breath. 8 g 3   ALPRAZolam  (XANAX ) 0.25 MG tablet TAKE 1 TABLET BY MOUTH AT BEDTIME 90 tablet 2   amLODipine -olmesartan  (AZOR ) 10-40 MG tablet Take 1 tablet by mouth once daily 90 tablet 0   citalopram  (CELEXA ) 40 MG tablet Take 1 tablet (40 mg total) by mouth daily. 90 tablet 1   Cyanocobalamin  (B-12 PO) Take by mouth.     Dupilumab  (DUPIXENT ) 200 MG/1. SOAJ Inject 200 mg into the skin every 14 (fourteen) days. 6.84 mL 0   glucosamine-chondroitin 500-400 MG tablet Take 1 tablet by mouth daily in the afternoon.     loratadine  (CLARITIN ) 10 MG tablet Take 10 mg by mouth daily as needed for allergies.     Methylsulfonylmethane (MSM) 1000 MG CAPS Take by mouth.     montelukast  (SINGULAIR ) 10 MG tablet Take 1 tablet (10 mg total) by mouth at bedtime. 30 tablet 11  Multiple Vitamin (MULTIVITAMIN) capsule Take 1 capsule by mouth daily.     prazosin  (MINIPRESS ) 1 MG capsule Take 1 capsule (1 mg total) by mouth at bedtime. 90 capsule 1   rosuvastatin  (CRESTOR ) 10 MG tablet Take 1 tablet by mouth once daily 90 tablet 0   Turmeric Curcumin 500 MG CAPS Take 1,000 mg by mouth daily.     No facility-administered medications prior to visit.   Review of Systems  Review of Systems  Constitutional: Negative.   Respiratory: Negative.     Physical Exam  BP 132/70   Pulse 87   Temp (!) 97.5 F (36.4 C)   Ht 5' 8 (1.727 m)    Wt 188 lb 3.2 oz (85.4 kg)   SpO2 98% Comment: RA  BMI 28.62 kg/m  Physical Exam Constitutional:      General: He is not in acute distress.    Appearance: Normal appearance. He is not ill-appearing.  HENT:     Head: Normocephalic and atraumatic.     Mouth/Throat:     Mouth: Mucous membranes are moist.     Pharynx: Oropharynx is clear.  Cardiovascular:     Rate and Rhythm: Normal rate and regular rhythm.  Pulmonary:     Effort: Pulmonary effort is normal.     Breath sounds: Normal breath sounds.  Musculoskeletal:        General: Normal range of motion.  Skin:    General: Skin is warm and dry.  Neurological:     General: No focal deficit present.     Mental Status: He is alert and oriented to person, place, and time. Mental status is at baseline.  Psychiatric:        Mood and Affect: Mood normal.        Behavior: Behavior normal.        Thought Content: Thought content normal.        Judgment: Judgment normal.      Lab Results:  CBC    Component Value Date/Time   WBC 4.3 06/05/2023 0929   RBC 4.65 06/05/2023 0929   HGB 13.6 06/05/2023 0929   HCT 41.2 06/05/2023 0929   PLT 311.0 06/05/2023 0929   MCV 88.5 06/05/2023 0929   MCH 29.7 01/31/2014 0550   MCHC 33.1 06/05/2023 0929   RDW 14.1 06/05/2023 0929   LYMPHSABS 1.7 06/05/2023 0929   MONOABS 0.4 06/05/2023 0929   EOSABS 0.2 06/05/2023 0929   BASOSABS 0.1 06/05/2023 0929    BMET    Component Value Date/Time   NA 136 06/05/2023 0929   K 4.2 06/05/2023 0929   CL 102 06/05/2023 0929   CO2 29 06/05/2023 0929   GLUCOSE 97 06/05/2023 0929   BUN 13 06/05/2023 0929   CREATININE 0.82 06/05/2023 0929   CALCIUM  10.2 06/05/2023 0929   GFRNONAA >90 01/31/2014 0550   GFRAA >90 01/31/2014 0550    BNP No results found for: BNP  ProBNP No results found for: PROBNP  Imaging: No results found.   Assessment & Plan:   1. Moderate persistent asthma without complication (Primary)  Assessment and  Plan Assessment & Plan Moderate persistent asthma Moderate persistent asthma, currently well-controlled with Dupixent . Previously on Trelegy, discontinued due to cost. Insurance requires a maintenance inhaler for Dupixent  coverage. Symptoms include throat clearing, cough, and wheezing, which have improved with Dupixent . No history of hospitalization for asthma. Uses albuterol  less than once daily. Reports mucus production without color change. Advair chosen for affordability and efficacy as a  maintenance inhaler. Medium dose selected to minimize steroid exposure while maintaining control. - Prescribe Advair 250, one puff morning and evening. - Refill montelukast  (Singulair ) 10 mg at bedtime. - Refill albuterol  as needed, two puffs every six hours for wheezing or shortness of breath. - Refill Dupixent  200 mg every 14 days. - Advise use of Mucinex or Robitussin if experiencing difficulty with mucus. - Continue nasal spray nightly. - Instruct to call if mucus changes color or if acute visit is needed.   Almarie LELON Ferrari, NP 11/14/2023

## 2023-11-14 NOTE — Telephone Encounter (Signed)
 Refill sent for DUPIXENT  to [facility]: [phone]  Dose: 200mg  [place] every 14 days  Last OV: (today) 11/14/23 with Almarie Ferrari, NP, previous: Dr. Brenna 02/19/23  Provider: previously seen by Dr. Brenna, no-show to visit with Dr. Theophilus on 07/13/23.  Patient needs visit with new MD since Dr. Brenna is no longer at this practice. Will route to scheduling team for assistance with scheduling with new MD.   Next OV: due in 6 months   Aleck Puls, PharmD, BCPS Clinical Pharmacist  [facility]

## 2023-11-14 NOTE — Telephone Encounter (Signed)
 Seen today, refill dupixent  needed

## 2023-11-14 NOTE — Patient Instructions (Signed)
  VISIT SUMMARY: You visited today for asthma management. Your asthma is currently well-controlled with your current medication regimen, and we discussed adjustments to ensure continued control and affordability.  YOUR PLAN: -MODERATE PERSISTENT ASTHMA: Moderate persistent asthma is a type of asthma that requires daily medication to control symptoms and prevent flare-ups. Your asthma is currently well-controlled with Dupixent , and we have chosen Advair as an affordable and effective maintenance inhaler. You should take one puff of Advair 250 in the morning and evening. Continue taking Singulair  (montelukast ) 10 mg at bedtime, and use your albuterol  inhaler as needed, two puffs every six hours for wheezing or shortness of breath. Refill your Dupixent  200 mg every 14 days. If you have difficulty with mucus, you can use Mucinex or Robitussin. Continue using your nasal spray nightly. Please call if your mucus changes color or if you need an acute visit.  INSTRUCTIONS: Please follow up as needed if you experience any changes in your symptoms or if you need an acute visit. Refill your medications as prescribed and continue with your current regimen. Call the office if you have any questions or concerns.  Follow-up 6 months with Landry NP

## 2023-11-19 ENCOUNTER — Encounter: Payer: Self-pay | Admitting: Internal Medicine

## 2023-11-20 ENCOUNTER — Encounter: Payer: Self-pay | Admitting: Family Medicine

## 2023-11-20 ENCOUNTER — Other Ambulatory Visit: Payer: Self-pay | Admitting: Internal Medicine

## 2023-11-20 ENCOUNTER — Telehealth (INDEPENDENT_AMBULATORY_CARE_PROVIDER_SITE_OTHER): Admitting: Family Medicine

## 2023-11-20 ENCOUNTER — Ambulatory Visit: Payer: Self-pay

## 2023-11-20 DIAGNOSIS — U071 COVID-19: Secondary | ICD-10-CM | POA: Insufficient documentation

## 2023-11-20 DIAGNOSIS — E782 Mixed hyperlipidemia: Secondary | ICD-10-CM

## 2023-11-20 MED ORDER — NIRMATRELVIR/RITONAVIR (PAXLOVID)TABLET: 3.0000 | ORAL_TABLET | Freq: Two times a day (BID) | ORAL | 0 refills | Status: AC

## 2023-11-20 NOTE — Patient Instructions (Signed)
 I have sent in Paxlovid  for you to take twice a day for 5 days.    I recommend you eat with this medication, as it can upset your stomach if you do not.  This medication can sometimes leave a bad taste in your mouth, it is okay to use mints, gum, to help combat this.   If this occurs, it is temporary and will resolve once the medication course is completed.  There is a minor risk of rebound COVID after taking Paxlovid .  Continue supportive care at home, make sure you are drinking plenty of fluids and getting some rest.  May take ibuprofen and Tylenol  as needed for fever, aches and pains.  Stay home and away from everyone until you are 24 hours fever free without fever reducing medications.  Follow-up with me if symptoms are persisting over the next week.  Follow-up in the emergency room if you are experiencing shortness of breath, high fever, unremitting cough, severe fatigue, other concerning symptoms.  DO NOT take statin with paxlovid 

## 2023-11-20 NOTE — Progress Notes (Signed)
 Video Visit Note  Subjective:     Patient ID: Edwin Brown, male    DOB: 21-Apr-1955, 68 y.o.   MRN: [id number]  Chief Complaint  Patient presents with   Acute Visit    Tested positive 09/08, symptoms started on Yesterday. Wife already has Covid as well. Body aches, sneezing, headache, sore throat, wet cough but not coughing up much. Has only taken ibuprofen. Denies fevers, and vomiting. A little nausea    HPI  I connected with Jakorey Mcconathy Goffe on 11/20/23 at  1:20 PM EDT by a video enabled telemedicine application and verified that I am speaking with the correct person using two identifiers.  Patient Location: Home Provider Location: Office/Clinic  I discussed the limitations, risks, security, and privacy concerns of performing an evaluation and management service by video and the availability of in person appointments. I also discussed with the patient that there may be a patient responsible charge related to this service. The patient expressed understanding and agreed to proceed.  68 yo M presents via AV visit for evaluation of positive COVID 19 testing at home yesterday.  Audio failure with AV visit, continued visit over phone call.  PMH of HTN, persistent asthma on dupixent , HLD. Reports cough, fatigue, headache, nasal congestion for the last 2 days.  States that his wife is currently being treated for COVID 19.  Has completed COVID vaccines. Has positive history of COVID with pneumonia. Has been taking OTC cough and cold with no relief. Denies fever, chills, abdominal pain, nausea, vomiting, diarrhea, rash, other symptoms.   ROS Per HPI      Objective:    There were no vitals taken for this visit.   Physical Exam Vitals and nursing note reviewed.  Constitutional:      General: He is not in acute distress.    Comments: Appears fatigued  HENT:     Head: Normocephalic and atraumatic.     Nose:     Comments: Sounds congested Eyes:     Extraocular Movements:  Extraocular movements intact.  Pulmonary:     Effort: Pulmonary effort is normal.     Comments: Moderate cough Musculoskeletal:     Cervical back: Normal range of motion.  Neurological:     General: No focal deficit present.     Mental Status: He is alert and oriented to person, place, and time.  Psychiatric:        Mood and Affect: Mood normal.        Behavior: Behavior normal.     No results found for any visits on 11/20/23.      Assessment & Plan:   COVID-19 -     nirmatrelvir /ritonavir ; Take 3 tablets by mouth 2 (two) times daily for 5 days. (Take nirmatrelvir  150 mg two tablets twice daily for 5 days and ritonavir  100 mg one tablet twice daily for 5 days) Patient GFR is 89  Dispense: 30 tablet; Refill: 0  Mixed hyperlipidemia  - Do NOT take statins with paxlovid .  - May resume after paxlovid  completion  Isolation precautions and supportive treatments discussed   No orders of the defined types were placed in this encounter.    Meds ordered this encounter  Medications   nirmatrelvir /ritonavir  (PAXLOVID ) 20 x 150 MG & 10 x 100MG  TABS    Sig: Take 3 tablets by mouth 2 (two) times daily for 5 days. (Take nirmatrelvir  150 mg two tablets twice daily for 5 days and ritonavir  100 mg one tablet twice daily  for 5 days) Patient GFR is 89    Dispense:  30 tablet    Refill:  0    The patient was advised to call back or seek an in-person evaluation if the symptoms worsen or if the condition fails to improve as anticipated.  Return if symptoms worsen or fail to improve.  Corean LITTIE Ku, FNP

## 2023-11-20 NOTE — Telephone Encounter (Signed)
 FYI Only or Action Required?: FYI only for provider.  Patient was last seen in primary care on 06/05/2023 by Joshua Debby CROME, MD.  Called Nurse Triage reporting Covid Positive.  Symptoms began yesterday.  Interventions attempted: Rest, hydration, or home remedies.  Symptoms are: unchanged.  Triage Disposition: Call PCP Within 24 Hours - video visit scheduled  Patient/caregiver understands and will follow disposition?: Yes Reason for Disposition  [1] HIGH RISK patient (e.g., weak immune system, age > 64 years, obesity with BMI 30 or higher, pregnant, chronic lung disease or other chronic medical condition) AND [2] COVID symptoms (e.g., cough, fever)  (Exceptions: Already seen by PCP and no new or worsening symptoms.)  Answer Assessment - Initial Assessment Questions Pt requesting antiviral. Scheduled for video visit. Access instructions relayed to patient. Patient denies higher acuity questions. ED precautions reviewed, pt verbalized understanding. Confirmed pharmacy.   1. COVID-19 DIAGNOSIS: How do you know that you have COVID? (e.g., positive lab test or self-test, diagnosed by doctor or NP/PA, symptoms after exposure).     Home test  2. COVID-19 EXPOSURE: Was there any known exposure to COVID before the symptoms began? CDC Definition of close contact: within 6 feet (2 meters) for a total of 15 minutes or more over a 24-hour period.      Wife recently had covid  3. ONSET: When did the COVID-19 symptoms start?      11/19/23  4. WORST SYMPTOM: What is your worst symptom? (e.g., cough, fever, shortness of breath, muscle aches)     Sneezing, coughing, aches  5. COUGH: Do you have a cough? If Yes, ask: How bad is the cough?       Intermittent  6. FEVER: Do you have a fever? If Yes, ask: What is your temperature, how was it measured, and when did it start?     Denies  7. RESPIRATORY STATUS: Describe your breathing? (e.g., normal; shortness of breath, wheezing,  unable to speak)      Denies     10. HIGH RISK DISEASE: Do you have any chronic medical problems? (e.g., asthma, heart or lung disease, weak immune system, obesity, etc.)       Asthma  Protocols used: Coronavirus (COVID-19) Diagnosed or Suspected-A-AH Copied from CRM #[id number]. Topic: Clinical - Medical Advice >> Nov 20, 2023  9:27 AM Henretta I wrote: Reason for CRM: Patient tested positive for covid and would like to know if an anti-viral can be prescribed for him. If so he would like it sent to   [facility] [address] [place], [place] - [address] [place] [address] Phone: [phone] Fax: [phone] Hours: Not open 24 hours

## 2023-12-06 ENCOUNTER — Ambulatory Visit (INDEPENDENT_AMBULATORY_CARE_PROVIDER_SITE_OTHER)

## 2023-12-06 ENCOUNTER — Ambulatory Visit (INDEPENDENT_AMBULATORY_CARE_PROVIDER_SITE_OTHER): Admitting: Internal Medicine

## 2023-12-06 ENCOUNTER — Encounter: Payer: Self-pay | Admitting: Internal Medicine

## 2023-12-06 ENCOUNTER — Ambulatory Visit: Payer: Self-pay | Admitting: Internal Medicine

## 2023-12-06 VITALS — BP 162/94 | HR 92 | Temp 98.4°F | Resp 16 | Ht 68.0 in | Wt 185.4 lb

## 2023-12-06 DIAGNOSIS — E785 Hyperlipidemia, unspecified: Secondary | ICD-10-CM

## 2023-12-06 DIAGNOSIS — R972 Elevated prostate specific antigen [PSA]: Secondary | ICD-10-CM

## 2023-12-06 DIAGNOSIS — Z23 Encounter for immunization: Secondary | ICD-10-CM

## 2023-12-06 DIAGNOSIS — F1024 Alcohol dependence with alcohol-induced mood disorder: Secondary | ICD-10-CM

## 2023-12-06 DIAGNOSIS — I1A0 Resistant hypertension: Secondary | ICD-10-CM

## 2023-12-06 DIAGNOSIS — R052 Subacute cough: Secondary | ICD-10-CM

## 2023-12-06 DIAGNOSIS — Z800 Family history of malignant neoplasm of digestive organs: Secondary | ICD-10-CM | POA: Insufficient documentation

## 2023-12-06 LAB — CBC WITH DIFFERENTIAL/PLATELET
Basophils Absolute: 0.1 K/uL (ref 0.0–0.1)
Basophils Relative: 1.8 % (ref 0.0–3.0)
Eosinophils Absolute: 0.1 K/uL (ref 0.0–0.7)
Eosinophils Relative: 1.6 % (ref 0.0–5.0)
HCT: 40.4 % (ref 39.0–52.0)
Hemoglobin: 13.4 g/dL (ref 13.0–17.0)
Lymphocytes Relative: 27.3 % (ref 12.0–46.0)
Lymphs Abs: 1.7 K/uL (ref 0.7–4.0)
MCHC: 33.2 g/dL (ref 30.0–36.0)
MCV: 87.5 fl (ref 78.0–100.0)
Monocytes Absolute: 0.5 K/uL (ref 0.1–1.0)
Monocytes Relative: 7.4 % (ref 3.0–12.0)
Neutro Abs: 3.9 K/uL (ref 1.4–7.7)
Neutrophils Relative %: 61.9 % (ref 43.0–77.0)
Platelets: 306 K/uL (ref 150.0–400.0)
RBC: 4.62 Mil/uL (ref 4.22–5.81)
RDW: 13.4 % (ref 11.5–15.5)
WBC: 6.3 K/uL (ref 4.0–10.5)

## 2023-12-06 LAB — HEPATIC FUNCTION PANEL
ALT: 15 U/L (ref 0–53)
AST: 16 U/L (ref 0–37)
Albumin: 4.2 g/dL (ref 3.5–5.2)
Alkaline Phosphatase: 64 U/L (ref 39–117)
Bilirubin, Direct: 0.1 mg/dL (ref 0.0–0.3)
Total Bilirubin: 0.3 mg/dL (ref 0.2–1.2)
Total Protein: 6.7 g/dL (ref 6.0–8.3)

## 2023-12-06 LAB — BASIC METABOLIC PANEL WITH GFR
BUN: 14 mg/dL (ref 6–23)
CO2: 29 meq/L (ref 19–32)
Calcium: 10.2 mg/dL (ref 8.4–10.5)
Chloride: 103 meq/L (ref 96–112)
Creatinine, Ser: 0.9 mg/dL (ref 0.40–1.50)
GFR: 87.07 mL/min (ref >60.00)
Glucose, Bld: 96 mg/dL (ref 70–99)
Potassium: 4.4 meq/L (ref 3.5–5.1)
Sodium: 140 meq/L (ref 135–145)

## 2023-12-06 LAB — PSA: PSA: 2.72 ng/mL (ref 0.10–4.00)

## 2023-12-06 LAB — TSH: TSH: 1.41 u[IU]/mL (ref 0.35–5.50)

## 2023-12-06 MED ORDER — VITAMIN B-1 50 MG PO TABS: 50.0000 mg | ORAL_TABLET | Freq: Every day | ORAL | 1 refills | Status: AC

## 2023-12-06 MED ORDER — ROSUVASTATIN CALCIUM 10 MG PO TABS: 10.0000 mg | ORAL_TABLET | Freq: Every day | ORAL | 0 refills | Status: AC

## 2023-12-06 MED ORDER — NALTREXONE HCL 50 MG PO TABS: 50.0000 mg | ORAL_TABLET | Freq: Every day | ORAL | 0 refills | Status: DC

## 2023-12-06 MED ORDER — AMLODIPINE-OLMESARTAN 10-40 MG PO TABS: 1.0000 | ORAL_TABLET | Freq: Every day | ORAL | 0 refills | Status: AC

## 2023-12-06 NOTE — Progress Notes (Signed)
 Subjective:  Patient ID: Edwin Brown, male    DOB: Nov 14, 1955  Age: 68 y.o. MRN: [id number]  CC: Cough and Hypertension   HPI Edwin Brown presents for f/up ----  Discussed the use of AI scribe software for clinical note transcription with the patient, who gave verbal consent to proceed.  History of Present Illness Edwin Brown is a 68 year old male with chronic asthma who presents with a persistent cough following a recent COVID-19 infection.  He had COVID-19 about three weeks ago and now experiences a persistent, itchy cough described as a 'tickle type'. The cough is occasionally productive of clear or whitish phlegm but is not associated with hemoptysis. No fevers, chills, or night sweats are present.  He has a history of chronic asthma and is currently on Dupixent , which has significantly improved his symptoms. He feels his respiration is 'pretty good'.  He has been off his blood pressure medication for more than a week due to recent stress related to his mother's passing and instructions to avoid statins while on Paxlovid . He resumed his blood pressure medication this morning. His blood pressure readings at home have been around 130/85, but today's reading was 162/94, which he notes is high for him. No symptoms such as headache or blurred vision are reported.  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 He reports no issues with physical activity, stating 'my back's not bothering me'. He consumes a couple of beers a day and does not smoke.     Outpatient Medications Prior to Visit  Medication Sig Dispense Refill   albuterol  (VENTOLIN  HFA) 108 (90 Base) MCG/ACT inhaler Inhale 2 puffs into the lungs every 6 (six) hours as needed for wheezing or shortness of breath. 8 g 3   citalopram  (CELEXA ) 40 MG tablet Take 1 tablet (40 mg total) by mouth daily. 90 tablet 1   Cyanocobalamin  (B-12 PO) Take by mouth.     Dupilumab   (DUPIXENT ) 200 MG/1. SOAJ Inject 200 mg into the skin every 14 (fourteen) days. 6.84 mL 1   fluticasone -salmeterol (ADVAIR) 250-50 MCG/ACT AEPB Inhale 1 puff into the lungs in the morning and at bedtime. 60 each 11   glucosamine-chondroitin 500-400 MG tablet Take 1 tablet by mouth daily in the afternoon.     loratadine  (CLARITIN ) 10 MG tablet Take 10 mg by mouth daily as needed for allergies.     Methylsulfonylmethane (MSM) 1000 MG CAPS Take by mouth.     montelukast  (SINGULAIR ) 10 MG tablet Take 1 tablet (10 mg total) by mouth at bedtime. 30 tablet 11   Multiple Vitamin (MULTIVITAMIN) capsule Take 1 capsule by mouth daily.     prazosin  (MINIPRESS ) 1 MG capsule Take 1 capsule (1 mg total) by mouth at bedtime. 90 capsule 1   Turmeric Curcumin 500 MG CAPS Take 1,000 mg by mouth daily.     ALPRAZolam  (XANAX ) 0.25 MG tablet TAKE 1 TABLET BY MOUTH AT BEDTIME 90 tablet 2   amLODipine -olmesartan  (AZOR ) 10-40 MG tablet Take 1 tablet by mouth once daily 90 tablet 0   rosuvastatin  (CRESTOR ) 10 MG tablet Take 1 tablet by mouth once daily 90 tablet 0   No facility-administered medications prior to visit.    ROS Review of Systems  Constitutional:  Negative for appetite change, chills, diaphoresis, fatigue and fever.  HENT: Negative.  Negative for trouble swallowing.   Respiratory:  Positive for cough. Negative for chest tightness, shortness  of breath and wheezing.   Cardiovascular:  Negative for chest pain, palpitations and leg swelling.  Gastrointestinal:  Negative for abdominal pain, constipation, diarrhea, nausea and vomiting.  Endocrine: Negative.   Genitourinary:  Negative for difficulty urinating and dysuria.  Musculoskeletal: Negative.  Negative for arthralgias, back pain and myalgias.  Skin: Negative.  Negative for color change.  Neurological: Negative.  Negative for dizziness and weakness.  Hematological:  Negative for adenopathy. Does not bruise/bleed easily.  Psychiatric/Behavioral:   Positive for confusion, decreased concentration and sleep disturbance. Negative for suicidal ideas. The patient is nervous/anxious.     Objective:  BP (!) 162/94 (BP Location: Left Arm, Patient Position: Sitting, Cuff Size: Normal)   Pulse 92   Temp 98.4 F (36.9 C) (Oral)   Resp 16   Ht 5' 8 (1.727 m)   Wt 185 lb 6.4 oz (84.1 kg)   SpO2 98%   BMI 28.19 kg/m   BP Readings from Last 3 Encounters:  12/06/23 (!) 162/94  11/14/23 132/70  07/05/23 (!) 143/78    Wt Readings from Last 3 Encounters:  12/06/23 185 lb 6.4 oz (84.1 kg)  11/14/23 188 lb 3.2 oz (85.4 kg)  07/05/23 178 lb (80.7 kg)    Physical Exam Vitals reviewed.  Constitutional:      General: He is not in acute distress.    Appearance: He is not ill-appearing, toxic-appearing or diaphoretic.  HENT:     Nose: Nose normal.     Mouth/Throat:     Mouth: Mucous membranes are moist.  Eyes:     General: No scleral icterus.    Conjunctiva/sclera: Conjunctivae normal.  Cardiovascular:     Rate and Rhythm: Normal rate and regular rhythm.     Heart sounds: No murmur heard.    No friction rub. No gallop.     Comments: EKG ----  NSR, 84 bpm LAD Incomplete RBBB unchanged Pulmonary:     Effort: Pulmonary effort is normal. No tachypnea or accessory muscle usage.     Breath sounds: Examination of the left-lower field reveals rhonchi. Rhonchi present. No decreased breath sounds, wheezing or rales.  Abdominal:     General: Abdomen is flat.     Palpations: There is no mass.     Tenderness: There is no abdominal tenderness. There is no guarding.     Hernia: No hernia is present.  Musculoskeletal:        General: Normal range of motion.     Cervical back: Neck supple.     Right lower leg: No edema.     Left lower leg: No edema.  Lymphadenopathy:     Cervical: No cervical adenopathy.  Skin:    General: Skin is warm and dry.  Neurological:     General: No focal deficit present.     Mental Status: He is alert.   Psychiatric:        Attention and Perception: He is inattentive.        Mood and Affect: Mood is anxious.        Speech: He is communicative. Speech is tangential. Speech is not delayed.        Behavior: Behavior normal.        Thought Content: Thought content normal. Thought content is not paranoid or delusional. Thought content does not include homicidal or suicidal ideation.        Cognition and Memory: Cognition is impaired. Memory is impaired.     Lab Results  Component Value Date  WBC 6.3 12/06/2023   HGB 13.4 12/06/2023   HCT 40.4 12/06/2023   PLT 306.0 12/06/2023   GLUCOSE 96 12/06/2023   CHOL 145 06/05/2023   TRIG 104.0 06/05/2023   HDL 64.40 06/05/2023   LDLCALC 60 06/05/2023   ALT 15 12/06/2023   AST 16 12/06/2023   NA 140 12/06/2023   K 4.4 12/06/2023   CL 103 12/06/2023   CREATININE 0.90 12/06/2023   BUN 14 12/06/2023   CO2 29 12/06/2023   TSH 1.41 12/06/2023   PSA 2.72 12/06/2023   DG Chest 2 View Result Date: 12/06/2023 CLINICAL DATA:  Cough.  COVID-19 diagnosed 3 weeks ago. EXAM: CHEST - 2 VIEW COMPARISON:  12/18/2013 FINDINGS: Lungs are adequately inflated and otherwise clear. Cardiomediastinal silhouette and remainder of the exam is unchanged. IMPRESSION: No active cardiopulmonary disease. Electronically Signed   By: Toribio Agreste M.D.   On: 12/06/2023 10:44     Assessment & Plan:   Resistant hypertension- His BP is not at goal due to non-compliance. Will restart the CCB and ARB. -     Basic metabolic panel with GFR; Future -     CBC with Differential/Platelet; Future -     TSH; Future -     Hepatic function panel; Future -     AMB Referral VBCI Care Management -     EKG 12-Lead -     amLODIPine -Olmesartan ; Take 1 tablet by mouth daily.  Dispense: 90 tablet; Refill: 0  Need for immunization against influenza -     Flu vaccine HIGH DOSE PF(Fluzone Trivalent)  Subacute cough -     DG Chest 2 View; Future  Alcohol dependence with alcohol-induced  mood disorder (HCC) -     Vitamin B-1; Take 1 tablet (50 mg total) by mouth daily.  Dispense: 90 tablet; Refill: 1 -     AMB Referral VBCI Care Management -     Naltrexone  HCl; Take 1 tablet (50 mg total) by mouth daily.  Dispense: 90 tablet; Refill: 0  Elevated PSA -     PSA; Future  Dyslipidemia, goal LDL below 100 -     Rosuvastatin  Calcium ; Take 1 tablet (10 mg total) by mouth daily.  Dispense: 90 tablet; Refill: 0     Follow-up: No follow-ups on file.  Debby Molt, MD

## 2023-12-06 NOTE — Patient Instructions (Signed)
 Hypertension, Adult High blood pressure (hypertension) is when the force of blood pumping through the arteries is too strong. The arteries are the blood vessels that carry blood from the heart throughout the body. Hypertension forces the heart to work harder to pump blood and may cause arteries to become narrow or stiff. Untreated or uncontrolled hypertension can lead to a heart attack, heart failure, a stroke, kidney disease, and other problems. A blood pressure reading consists of a higher number over a lower number. Ideally, your blood pressure should be below 120/80. The first ("top") number is called the systolic pressure. It is a measure of the pressure in your arteries as your heart beats. The second ("bottom") number is called the diastolic pressure. It is a measure of the pressure in your arteries as the heart relaxes. What are the causes? The exact cause of this condition is not known. There are some conditions that result in high blood pressure. What increases the risk? Certain factors may make you more likely to develop high blood pressure. Some of these risk factors are under your control, including: Smoking. Not getting enough exercise or physical activity. Being overweight. Having too much fat, sugar, calories, or salt (sodium) in your diet. Drinking too much alcohol. Other risk factors include: Having a personal history of heart disease, diabetes, high cholesterol, or kidney disease. Stress. Having a family history of high blood pressure and high cholesterol. Having obstructive sleep apnea. Age. The risk increases with age. What are the signs or symptoms? High blood pressure may not cause symptoms. Very high blood pressure (hypertensive crisis) may cause: Headache. Fast or irregular heartbeats (palpitations). Shortness of breath. Nosebleed. Nausea and vomiting. Vision changes. Severe chest pain, dizziness, and seizures. How is this diagnosed? This condition is diagnosed by  measuring your blood pressure while you are seated, with your arm resting on a flat surface, your legs uncrossed, and your feet flat on the floor. The cuff of the blood pressure monitor will be placed directly against the skin of your upper arm at the level of your heart. Blood pressure should be measured at least twice using the same arm. Certain conditions can cause a difference in blood pressure between your right and left arms. If you have a high blood pressure reading during one visit or you have normal blood pressure with other risk factors, you may be asked to: Return on a different day to have your blood pressure checked again. Monitor your blood pressure at home for 1 week or longer. If you are diagnosed with hypertension, you may have other blood or imaging tests to help your health care provider understand your overall risk for other conditions. How is this treated? This condition is treated by making healthy lifestyle changes, such as eating healthy foods, exercising more, and reducing your alcohol intake. You may be referred for counseling on a healthy diet and physical activity. Your health care provider may prescribe medicine if lifestyle changes are not enough to get your blood pressure under control and if: Your systolic blood pressure is above 130. Your diastolic blood pressure is above 80. Your personal target blood pressure may vary depending on your medical conditions, your age, and other factors. Follow these instructions at home: Eating and drinking  Eat a diet that is high in fiber and potassium, and low in sodium, added sugar, and fat. An example of this eating plan is called the DASH diet. DASH stands for Dietary Approaches to Stop Hypertension. To eat this way: Eat  plenty of fresh fruits and vegetables. Try to fill one half of your plate at each meal with fruits and vegetables. Eat whole grains, such as whole-wheat pasta, brown rice, or whole-grain bread. Fill about one  fourth of your plate with whole grains. Eat or drink low-fat dairy products, such as skim milk or low-fat yogurt. Avoid fatty cuts of meat, processed or cured meats, and poultry with skin. Fill about one fourth of your plate with lean proteins, such as fish, chicken without skin, beans, eggs, or tofu. Avoid pre-made and processed foods. These tend to be higher in sodium, added sugar, and fat. Reduce your daily sodium intake. Many people with hypertension should eat less than 1,500 mg of sodium a day. Do not drink alcohol if: Your health care provider tells you not to drink. You are pregnant, may be pregnant, or are planning to become pregnant. If you drink alcohol: Limit how much you have to: 0-1 drink a day for women. 0-2 drinks a day for men. Know how much alcohol is in your drink. In the [place] one drink equals one 12 oz bottle of beer (355 mL), one 5 oz glass of wine (148 mL), or one 1 oz glass of hard liquor (44 mL). Lifestyle  Work with your health care provider to maintain a healthy body weight or to lose weight. Ask what an ideal weight is for you. Get at least 30 minutes of exercise that causes your heart to beat faster (aerobic exercise) most days of the week. Activities may include walking, swimming, or biking. Include exercise to strengthen your muscles (resistance exercise), such as Pilates or lifting weights, as part of your weekly exercise routine. Try to do these types of exercises for 30 minutes at least 3 days a week. Do not use any products that contain nicotine or tobacco. These products include cigarettes, chewing tobacco, and vaping devices, such as e-cigarettes. If you need help quitting, ask your health care provider. Monitor your blood pressure at home as told by your health care provider. Keep all follow-up visits. This is important. Medicines Take over-the-counter and prescription medicines only as told by your health care provider. Follow directions carefully. Blood  pressure medicines must be taken as prescribed. Do not skip doses of blood pressure medicine. Doing this puts you at risk for problems and can make the medicine less effective. Ask your health care provider about side effects or reactions to medicines that you should watch for. Contact a health care provider if you: Think you are having a reaction to a medicine you are taking. Have headaches that keep coming back (recurring). Feel dizzy. Have swelling in your ankles. Have trouble with your vision. Get help right away if you: Develop a severe headache or confusion. Have unusual weakness or numbness. Feel faint. Have severe pain in your chest or abdomen. Vomit repeatedly. Have trouble breathing. These symptoms may be an emergency. Get help right away. Call 911. Do not wait to see if the symptoms will go away. Do not drive yourself to the hospital. Summary Hypertension is when the force of blood pumping through your arteries is too strong. If this condition is not controlled, it may put you at risk for serious complications. Your personal target blood pressure may vary depending on your medical conditions, your age, and other factors. For most people, a normal blood pressure is less than 120/80. Hypertension is treated with lifestyle changes, medicines, or a combination of both. Lifestyle changes include losing weight, eating a healthy,  low-sodium diet, exercising more, and limiting alcohol. This information is not intended to replace advice given to you by your health care provider. Make sure you discuss any questions you have with your health care provider. Document Revised: 01/04/2021 Document Reviewed: 01/04/2021 Elsevier Patient Education  2024 [employer].

## 2023-12-11 ENCOUNTER — Telehealth: Payer: Self-pay | Admitting: *Deleted

## 2023-12-11 NOTE — Progress Notes (Unsigned)
 Complex Care Management Note Care Guide Note  12/11/2023 Name: Edwin Brown MRN: [id number] DOB: [birth year]   Complex Care Management Outreach Attempts: An unsuccessful telephone outreach was attempted today to offer the patient information about available complex care management services.  Follow Up Plan:  Additional outreach attempts will be made to offer the patient complex care management information and services.   Encounter Outcome:  No Answer  Thedford Franks, CMA [place]  [facility] Guide Direct Dial: [phone]  Fax: [phone] Website: [email or web address]

## 2023-12-12 NOTE — Progress Notes (Signed)
 Complex Care Management Note  Care Guide Note 12/12/2023 Name: BOY DELAMATER MRN: [id number] DOB: 06-23-1955  DINH AYOTTE is a 68 y.o. year old male who sees Joshua Debby CROME, MD for primary care. I reached out to Elspeth SHAUNNA Rily by phone today to offer complex care management services.  Mr. Perrell was given information about Complex Care Management services today including:   The Complex Care Management services include support from the care team which includes your Nurse Care Manager, Clinical Social Worker, or Pharmacist.  The Complex Care Management team is here to help remove barriers to the health concerns and goals most important to you. Complex Care Management services are voluntary, and the patient may decline or stop services at any time by request to their care team member.   Complex Care Management Consent Status: Patient agreed to services and verbal consent obtained.   Follow up plan:  Telephone appointment with complex care management team member scheduled for:  12/20/2023 and 12/28/2023  Encounter Outcome:  Patient Scheduled  Thedford Franks, CMA [place]  [facility], [facility] Guide Direct Dial: [phone]  Fax: [phone] Website: [email or web address]

## 2023-12-20 ENCOUNTER — Other Ambulatory Visit: Admitting: Pharmacist

## 2023-12-20 DIAGNOSIS — I1A0 Resistant hypertension: Secondary | ICD-10-CM

## 2023-12-20 NOTE — Progress Notes (Signed)
 12/20/2023 Name: Edwin Brown MRN: [id number] DOB: [birth year]  Chief Complaint  Patient presents with   Hypertension   Medication Management    Edwin Brown is a 68 y.o. year old male who presented for a telephone visit.   They were referred to the pharmacist by their PCP for assistance in managing hypertension and alcohol use disorder.    Subjective:  Care Team: Primary Care Provider: Joshua Debby CROME, MD   Medication Access/Adherence  Current Pharmacy:  [facility] [address] [place] - [address] [place] [address] Phone: [phone] Fax: [phone]  [facility] - [address] [place] [address] Phone: [phone] Fax: [phone]   Patient reports affordability concerns with their medications: No  Patient reports access/transportation concerns to their pharmacy: No  Patient reports adherence concerns with their medications:  Yes    Pt reports he was not aware of naltrexone  for alcohol use disorder being prescribed. He notes reduced alcohol consumption or treatment for reducing alcohol consumption was not previously discussed. He is not interested in taking medication for this.   Hypertension:  Current medications: amlodipine /olmesartan  10/40 mg daily **Pt notes he was out of meds prior to PCP appt, which was the reason for his high BP. He notes he has been back taking his medications as prescribed with no problems  Patient has a validated, automated, upper arm home BP cuff Current blood pressure readings readings: has not checked since OV  Patient denies hypotensive s/sx including dizziness, lightheadedness.  Patient denies hypertensive symptoms including headache, chest pain, shortness of breath   Objective:  BP Readings from Last 3 Encounters:  12/06/23 (!) 162/94  11/14/23 132/70  07/05/23 (!) 143/78     No results found for:  HGBA1C  Lab Results  Component Value Date   CREATININE 0.90 12/06/2023   BUN 14 12/06/2023   NA 140 12/06/2023   K 4.4 12/06/2023   CL 103 12/06/2023   CO2 29 12/06/2023    Lab Results  Component Value Date   CHOL 145 06/05/2023   HDL 64.40 06/05/2023   LDLCALC 60 06/05/2023   TRIG 104.0 06/05/2023   CHOLHDL 2 06/05/2023    Medications Reviewed Today     Reviewed by Merceda Lela SAUNDERS, RPH (Pharmacist) on 12/20/23 at 1656  Med List Status: <None>   Medication Order Taking? Sig Documenting Provider Last Dose Status Informant  albuterol  (VENTOLIN  HFA) 108 (90 Base) MCG/ACT inhaler [id number]  Inhale 2 puffs into the lungs every 6 (six) hours as needed for wheezing or shortness of breath. Hope Almarie ORN, NP  Active   amLODipine -olmesartan  (AZOR ) 10-40 MG tablet [id number] Yes Take 1 tablet by mouth daily. Joshua Debby CROME, MD  Active   citalopram  (CELEXA ) 40 MG tablet [id number]  Take 1 tablet (40 mg total) by mouth daily. Joshua Debby CROME, MD  Active   Cyanocobalamin  (B-12 PO) [id number]  Take by mouth. [provider]  Active   Dupilumab  (DUPIXENT ) 200 MG/1. SOAJ [id number]  Inject 200 mg into the skin every 14 (fourteen) days. Alva, Rakesh V, MD  Active   fluticasone -salmeterol (ADVAIR) 250-50 MCG/ACT AEPB [id number]  Inhale 1 puff into the lungs in the morning and at bedtime. Hope Almarie ORN, NP  Active   glucosamine-chondroitin 500-400 MG tablet [id number]  Take 1 tablet by mouth daily in the afternoon. [provider]  Active   loratadine  (CLARITIN ) 10 MG  tablet [id number]  Take 10 mg by mouth daily as needed for allergies. [provider]  Active Spouse/Significant Other  Methylsulfonylmethane (MSM) 1000 MG CAPS [id number]  Take by mouth. [provider]  Active   montelukast  (SINGULAIR ) 10 MG tablet [id number]  Take 1 tablet (10 mg total) by mouth at bedtime. Hope Almarie ORN, NP  Active   Multiple Vitamin (MULTIVITAMIN) capsule  [id number]  Take 1 capsule by mouth daily. [provider]  Active   naltrexone  (DEPADE) 50 MG tablet [id number]  Take 1 tablet (50 mg total) by mouth daily.  Patient not taking: Reported on 12/20/2023   Joshua Debby CROME, MD  Active   prazosin  (MINIPRESS ) 1 MG capsule [id number]  Take 1 capsule (1 mg total) by mouth at bedtime. Joshua Debby CROME, MD  Active   rosuvastatin  (CRESTOR ) 10 MG tablet [id number]  Take 1 tablet (10 mg total) by mouth daily. Joshua Debby CROME, MD  Active   thiamine (VITAMIN B-1) 50 MG tablet [id number]  Take 1 tablet (50 mg total) by mouth daily. Joshua Debby CROME, MD  Active   Turmeric Curcumin 500 MG CAPS [id number]  Take 1,000 mg by mouth daily. [provider]  Active Spouse/Significant Other              Assessment/Plan:   Hypertension: - Currently uncontrolled, BP goal <130/80 - Reviewed long term cardiovascular and renal outcomes of uncontrolled blood pressure - Reviewed appropriate blood pressure monitoring technique and reviewed goal blood pressure. Recommended to check home blood pressure and heart rate and notify us  if BP are >130/80 - Recommend to continue current regimen    *Naltrexone  removed from medication list as pt not interested in taking.   Follow Up Plan: PRN  Darrelyn Drum, PharmD, BCPS, CPP Clinical Pharmacist Practitioner [facility] [phone]

## 2023-12-28 ENCOUNTER — Telehealth: Payer: Self-pay | Admitting: *Deleted

## 2023-12-28 ENCOUNTER — Encounter: Payer: Self-pay | Admitting: *Deleted

## 2024-01-07 ENCOUNTER — Ambulatory Visit (INDEPENDENT_AMBULATORY_CARE_PROVIDER_SITE_OTHER)

## 2024-01-07 VITALS — Ht 67.5 in | Wt 182.0 lb

## 2024-01-07 DIAGNOSIS — Z0000 Encounter for general adult medical examination without abnormal findings: Secondary | ICD-10-CM | POA: Diagnosis not present

## 2024-01-07 NOTE — Patient Instructions (Addendum)
 [name],  Thank you for taking the time for your Medicare Wellness Visit. I appreciate your continued commitment to your health goals. Please review the care plan we discussed, and feel free to reach out if I can assist you further.  Medicare recommends these wellness visits once per year to help you and your care team stay ahead of potential health issues. These visits are designed to focus on prevention, allowing your provider to concentrate on managing your acute and chronic conditions during your regular appointments.  Please note that Annual Wellness Visits do not include a physical exam. Some assessments may be limited, especially if the visit was conducted virtually. If needed, we may recommend a separate in-person follow-up with your provider.  Ongoing Care Seeing your primary care provider every 3 to 6 months helps us  monitor your health and provide consistent, personalized care.   Referrals If a referral was made during today's visit and you haven't received any updates within two weeks, please contact the referred provider directly to check on the status.  Recommended Screenings:  Health Maintenance  Topic Date Due   Zoster (Shingles) Vaccine (1 of 2) Never done   COVID-19 Vaccine (7 - 2025-26 season) 01/16/2024*   Medicare Annual Wellness Visit  01/06/2025   DTaP/Tdap/Td vaccine (2 - Td or Tdap) 12/08/2026   Colon Cancer Screening  10/15/2032   Pneumococcal Vaccine for age over 20  Completed   Flu Shot  Completed   Hepatitis C Screening  Completed   Meningitis B Vaccine  Aged Out  *Topic was postponed. The date shown is not the original due date.       01/07/2024    8:13 AM  Advanced Directives  Does Patient Have a Medical Advance Directive? No  Would patient like information on creating a medical advance directive? No - Patient declined   Advance Care Planning is important because it: Ensures you receive medical care that aligns with your values, goals, and  preferences. Provides guidance to your family and loved ones, reducing the emotional burden of decision-making during critical moments.  Vision: Annual vision screenings are recommended for early detection of glaucoma, cataracts, and diabetic retinopathy. These exams can also reveal signs of chronic conditions such as diabetes and high blood pressure.  Dental: Annual dental screenings help detect early signs of oral cancer, gum disease, and other conditions linked to overall health, including heart disease and diabetes.

## 2024-01-07 NOTE — Progress Notes (Signed)
 Subjective:   Edwin Brown is a 68 y.o. who presents for a Medicare Wellness preventive visit.  As a reminder, Annual Wellness Visits don't include a physical exam, and some assessments may be limited, especially if this visit is performed virtually. We may recommend an in-person follow-up visit with your provider if needed.  Visit Complete: Virtual I connected with  Emanual Lamountain Suleiman on 01/07/24 by a audio enabled telemedicine application and verified that I am speaking with the correct person using two identifiers.  Patient Location: Home  Provider Location: Office/Clinic  I discussed the limitations of evaluation and management by telemedicine. The patient expressed understanding and agreed to proceed.  Vital Signs: Because this visit was a virtual/telehealth visit, some criteria may be missing or patient reported. Any vitals not documented were not able to be obtained and vitals that have been documented are patient reported.  VideoDeclined- This patient declined Librarian, academic. Therefore the visit was completed with audio only.  Persons Participating in Visit: Patient.  AWV Questionnaire: No: Patient Medicare AWV questionnaire was not completed prior to this visit.  Cardiac Risk Factors include: advanced age (>83men, >99 women);hypertension;male gender;Other (see comment), Risk factor comments: currently taking Rosuvastatin  but not diagnosed with Dyslipidemia     Objective:    Today's Vitals   01/07/24 0814  Weight: 182 lb (82.6 kg)  Height: 5' 7.5 (1.715 m)   Body mass index is 28.08 kg/m.     01/07/2024    8:13 AM 09/07/2022   11:34 AM 06/22/2022    9:42 AM 01/29/2014    2:21 PM 01/29/2014   10:36 AM  Advanced Directives  Does Patient Have a Medical Advance Directive? No Yes No No  No   Type of Special Educational Needs Teacher of [place];Living will     Copy of Healthcare Power of Attorney in Chart?  No - copy requested     Would  patient like information on creating a medical advance directive? No - Patient declined  No - Patient declined No - patient declined information  No - patient declined information      Data saved with a previous flowsheet row definition    Current Medications (verified) Outpatient Encounter Medications as of 01/07/2024  Medication Sig   albuterol  (VENTOLIN  HFA) 108 (90 Base) MCG/ACT inhaler Inhale 2 puffs into the lungs every 6 (six) hours as needed for wheezing or shortness of breath.   amLODipine -olmesartan  (AZOR ) 10-40 MG tablet Take 1 tablet by mouth daily.   citalopram  (CELEXA ) 40 MG tablet Take 1 tablet (40 mg total) by mouth daily.   Cyanocobalamin  (B-12 PO) Take by mouth.   Dupilumab  (DUPIXENT ) 200 MG/1. SOAJ Inject 200 mg into the skin every 14 (fourteen) days.   fluticasone -salmeterol (ADVAIR) 250-50 MCG/ACT AEPB Inhale 1 puff into the lungs in the morning and at bedtime.   glucosamine-chondroitin 500-400 MG tablet Take 1 tablet by mouth daily in the afternoon.   loratadine  (CLARITIN ) 10 MG tablet Take 10 mg by mouth daily as needed for allergies.   Methylsulfonylmethane (MSM) 1000 MG CAPS Take by mouth.   montelukast  (SINGULAIR ) 10 MG tablet Take 1 tablet (10 mg total) by mouth at bedtime.   Multiple Vitamin (MULTIVITAMIN) capsule Take 1 capsule by mouth daily.   prazosin  (MINIPRESS ) 1 MG capsule Take 1 capsule (1 mg total) by mouth at bedtime.   rosuvastatin  (CRESTOR ) 10 MG tablet Take 1 tablet (10 mg total) by mouth daily.   thiamine (VITAMIN B-1)  50 MG tablet Take 1 tablet (50 mg total) by mouth daily.   Turmeric Curcumin 500 MG CAPS Take 1,000 mg by mouth daily.   No facility-administered encounter medications on file as of 01/07/2024.    Allergies (verified) Dilaudid  [hydromorphone  hcl], Phenergan  [promethazine  hcl], Hydromorphone  hcl, Other, and Promethazine  hcl   History: Past Medical History:  Diagnosis Date   Asthma    Depression    HTN (hypertension)     Hyperlipidemia    Syncope    Past Surgical History:  Procedure Laterality Date   APPENDECTOMY     CHOLECYSTECTOMY N/A 01/30/2014   Procedure: LAPAROSCOPIC CHOLECYSTECTOMY WITH INTRAOPERATIVE CHOLANGIOGRAM;  Surgeon: Donnice KATHEE Lunger, MD;  Location: [facility];  Service: General;  Laterality: N/A;   NASAL SINUS SURGERY     scaphoidectomy     VASECTOMY     Family History  Problem Relation Age of Onset   Heart attack Father    Heart attack Brother 34   Social History   Socioeconomic History   Marital status: Married    Spouse name: Not on file   Number of children: 3   Years of education: 15   Highest education level: Not on file  Occupational History   Occupation: market researcher   Occupation: retired    Comment: market researcher  Tobacco Use   Smoking status: Never   Smokeless tobacco: Never  Vaping Use   Vaping status: Never Used  Substance and Sexual Activity   Alcohol use: Yes    Alcohol/week: 25.0 standard drinks of alcohol    Types: 25 Cans of beer per week    Comment: 3/4 per day   Drug use: No   Sexual activity: Yes    Partners: Female  Other Topics Concern   Not on file  Social History Narrative   Right handed   Drinks caffeine   Two story home   Retired nicely   Lives in home with wife   Social Drivers of Corporate Investment Banker Strain: Low Risk  (01/07/2024)   Overall Financial Resource Strain (CARDIA)    Difficulty of Paying Living Expenses: Not hard at all  Food Insecurity: No Food Insecurity (01/07/2024)   Hunger Vital Sign    Worried About Running Out of Food in the Last Year: Never true    Ran Out of Food in the Last Year: Never true  Transportation Needs: No Transportation Needs (01/07/2024)   PRAPARE - Administrator, Civil Service (Medical): No    Lack of Transportation (Non-Medical): No  Physical Activity: Inactive (01/07/2024)   Exercise Vital Sign    Days of Exercise per Week: 0 days    Minutes of Exercise per Session: 0 min  Stress:  No Stress Concern Present (01/07/2024)   [employer] of Occupational Health - Occupational Stress Questionnaire    Feeling of Stress: Not at all  Social Connections: Moderately Isolated (01/07/2024)   Social Connection and Isolation Panel    Frequency of Communication with Friends and Family: More than three times a week    Frequency of Social Gatherings with Friends and Family: More than three times a week    Attends Religious Services: Never    Database Administrator or Organizations: No    Attends Banker Meetings: Never    Marital Status: Married    Tobacco Counseling Counseling given: Not Answered    Clinical Intake:  Pre-visit preparation completed: Yes  Pain : No/denies pain     BMI -  recorded: 28.08 Nutritional Status: BMI 25 -29 Overweight Nutritional Risks: None Diabetes: No  No results found for: HGBA1C   How often do you need to have someone help you when you read instructions, pamphlets, or other written materials from your doctor or pharmacy?: 1 - Never  Interpreter Needed?: No  Information entered by :: Verdie Saba, CMA   Activities of Daily Living     01/07/2024    8:16 AM  In your present state of health, do you have any difficulty performing the following activities:  Hearing? 0  Comment wears hearing aids  Vision? 0  Difficulty concentrating or making decisions? 0  Walking or climbing stairs? 0  Dressing or bathing? 0  Doing errands, shopping? 0  Preparing Food and eating ? N  Using the Toilet? N  In the past six months, have you accidently leaked urine? N  Do you have problems with loss of bowel control? N  Managing your Medications? N  Managing your Finances? N  Housekeeping or managing your Housekeeping? N    Patient Care Team: Joshua Debby CROME, MD as PCP - General (Internal Medicine) Lonni Slain, MD as PCP - Cardiology (Cardiology) Tat, Asberry RAMAN, DO as Consulting Physician (Neurology) Marcey Elspeth PARAS, MD as Consulting Physician (Ophthalmology)  I have updated your Care Teams any recent Medical Services you may have received from other providers in the past year.     Assessment:   This is a routine wellness examination for Daune.  Hearing/Vision screen Hearing Screening - Comments:: Wears hearing aids Vision Screening - Comments:: Wears eyeglasses for reading- up to date with routine eye exams with Elsp[facility] Marcey   Goals Addressed               This Visit's Progress     Patient Stated (pt-stated)        Patient stated he plans to continue watching his diet and wants to increase strengthing (still works).       Depression Screen     01/07/2024    8:18 AM 12/06/2023    8:39 AM 06/05/2023    8:56 AM 12/06/2022    9:02 AM 09/07/2022   11:20 AM 05/08/2022    9:00 AM  PHQ 2/9 Scores  PHQ - 2 Score 0 2 0 4 1 0  PHQ- 9 Score 0 5 0 15 2 0    Fall Risk     01/07/2024    8:17 AM 12/06/2023    8:39 AM 06/05/2023    8:55 AM 09/07/2022   11:20 AM 06/22/2022    9:42 AM  Fall Risk   Falls in the past year? 0 0 0 0 0  Number falls in past yr: 0 0 0 0 0  Injury with Fall? 0 0 0 0 0  Risk for fall due to : No Fall Risks No Fall Risks No Fall Risks No Fall Risks   Follow up Falls evaluation completed;Falls prevention discussed Falls evaluation completed Falls evaluation completed Falls prevention discussed Falls evaluation completed    MEDICARE RISK AT HOME:  Medicare Risk at Home Any stairs in or around the home?: Yes If so, are there any without handrails?: No Home free of loose throw rugs in walkways, pet beds, electrical cords, etc?: Yes Adequate lighting in your home to reduce risk of falls?: Yes Life alert?: No Use of a cane, walker or w/c?: No Grab bars in the bathroom?: No Shower chair or bench in shower?: No Elevated toilet seat  or a handicapped toilet?: No  TIMED UP AND GO:  Was the test performed?  No  Cognitive Function: 6CIT completed         01/07/2024    8:20 AM 09/07/2022   11:23 AM  6CIT Screen  What Year? 0 points 0 points  What month? 0 points 0 points  What time? 0 points 0 points  Count back from 20 0 points 0 points  Months in reverse 0 points 0 points  Repeat phrase 0 points 0 points  Total Score 0 points 0 points    Immunizations Immunization History  Administered Date(s) Administered   Fluad Trivalent(High Dose 65+) 12/06/2022   INFLUENZA, HIGH DOSE SEASONAL PF 12/06/2023   Influenza-Unspecified 02/14/2022   PFIZER(Purple Top)SARS-COV-2 Vaccination 04/24/2019, 05/15/2019, 12/19/2019, 07/01/2020, 02/25/2021   PNEUMOCOCCAL CONJUGATE-20 07/23/2020   Pfizer(Comirnaty)Fall Seasonal Vaccine 12 years and older 02/14/2022   Pneumococcal Polysaccharide-23 07/17/2019   Tdap 12/07/2016    Screening Tests Health Maintenance  Topic Date Due   Zoster Vaccines- Shingrix  (1 of 2) Never done   COVID-19 Vaccine (7 - 2025-26 season) 01/16/2024 (Originally 11/12/2023)   Medicare Annual Wellness (AWV)  01/06/2025   DTaP/Tdap/Td (2 - Td or Tdap) 12/08/2026   Colonoscopy  10/15/2032   Pneumococcal Vaccine: 50+ Years  Completed   Influenza Vaccine  Completed   Hepatitis C Screening  Completed   Meningococcal B Vaccine  Aged Out    Health Maintenance Items Addressed:  01/07/2024  Additional Screening:  Vision Screening: Recommended annual ophthalmology exams for early detection of glaucoma and other disorders of the eye. Is the patient up to date with their annual eye exam?  Yes  Who is the provider or what is the name of the office in which the patient attends annual eye exams? [facility]  Dental Screening: Recommended annual dental exams for proper oral hygiene  Community Resource Referral / Chronic Care Management: CRR required this visit?  No   CCM required this visit?  No   Plan:    I have personally reviewed and noted the following in the patient's chart:   Medical and social history Use of alcohol,  tobacco or illicit drugs  Current medications and supplements including opioid prescriptions. Patient is not currently taking opioid prescriptions. Functional ability and status Nutritional status Physical activity Advanced directives List of other physicians Hospitalizations, surgeries, and ER visits in previous 12 months Vitals Screenings to include cognitive, depression, and falls Referrals and appointments  In addition, I have reviewed and discussed with patient certain preventive protocols, quality metrics, and best practice recommendations. A written personalized care plan for preventive services as well as general preventive health recommendations were provided to patient.   Verdie CHRISTELLA Saba, CMA   01/07/2024   After Visit Summary: (MyChart) Due to this being a telephonic visit, the after visit summary with patients personalized plan was offered to patient via MyChart   Notes: Nothing significant to report at this time.

## 2024-01-09 ENCOUNTER — Telehealth: Payer: Self-pay | Admitting: *Deleted

## 2024-01-09 NOTE — Progress Notes (Signed)
 Complex Care Management Care Guide Note  01/09/2024 Name: MATTEO BANKE MRN: [id number] DOB: 01-24-1956  ORDELL PRICHETT is a 68 y.o. year old male who is a primary care patient of Joshua Debby CROME, MD and is actively engaged with the care management team. I reached out to Elspeth SHAUNNA Rily by phone today to assist with re-scheduling  with the Licensed Clinical Child Psychotherapist.  Follow up plan: Unsuccessful telephone outreach attempt made. A HIPAA compliant phone message was left for the patient providing contact information and requesting a return call.  Thedford Franks, CMA [place]  [facility], [facility] Guide Direct Dial: [phone]  Fax: [phone] Website: [email or web address]

## 2024-01-18 ENCOUNTER — Telehealth: Payer: Self-pay | Admitting: Family Medicine

## 2024-01-18 NOTE — Telephone Encounter (Signed)
 Pt called requesting repeat epidural.  Last epidural 01/18/2023 Last OV 07/17/2022  Scheduled 03/12/2024 for OV and placed on Wait List.

## 2024-01-21 ENCOUNTER — Other Ambulatory Visit: Payer: Self-pay

## 2024-01-21 ENCOUNTER — Encounter: Payer: Self-pay | Admitting: Family Medicine

## 2024-01-21 DIAGNOSIS — M5416 Radiculopathy, lumbar region: Secondary | ICD-10-CM

## 2024-01-23 ENCOUNTER — Ambulatory Visit
Admission: RE | Admit: 2024-01-23 | Discharge: 2024-01-23 | Disposition: A | Discharge status: Home / Self Care | Source: Ambulatory Visit | Attending: Family Medicine | Admitting: Family Medicine

## 2024-01-23 DIAGNOSIS — M5416 Radiculopathy, lumbar region: Secondary | ICD-10-CM

## 2024-01-23 MED ORDER — IOPAMIDOL (ISOVUE-M 200) INJECTION 41%: 1.0000 mL | Freq: Once | INTRAMUSCULAR | Status: DC

## 2024-01-23 MED ORDER — METHYLPREDNISOLONE ACETATE 40 MG/ML INJ SUSP (RADIOLOG: 80.0000 mg | Freq: Once | INTRAMUSCULAR | Status: DC

## 2024-01-23 NOTE — Discharge Instructions (Signed)

## 2024-01-25 NOTE — Progress Notes (Signed)
 Complex Care Management Care Guide Note  01/25/2024 Name: Edwin Brown MRN: [id number] DOB: Jul 01, 1955  Edwin Brown is a 68 y.o. year old male who is a primary care patient of Joshua Debby CROME, MD and is actively engaged with the care management team. I reached out to Edwin Brown by phone today to assist with re-scheduling  with the Licensed Clinical Child Psychotherapist.  Follow up plan: Unsuccessful telephone outreach attempt made. A HIPAA compliant phone message was left for the patient providing contact information and requesting a return call. No further outreach attempts will be made due to inability to maintain patient contact.   Thedford Franks, CMA [place]  [facility], [facility] Guide Direct Dial: [phone]  Fax: [phone] Website: [email or web address]

## 2024-02-20 ENCOUNTER — Other Ambulatory Visit: Payer: Self-pay | Admitting: Internal Medicine

## 2024-02-20 DIAGNOSIS — F514 Sleep terrors [night terrors]: Secondary | ICD-10-CM

## 2024-02-25 ENCOUNTER — Other Ambulatory Visit: Payer: Self-pay | Admitting: Internal Medicine

## 2024-02-25 DIAGNOSIS — F514 Sleep terrors [night terrors]: Secondary | ICD-10-CM

## 2024-03-11 NOTE — Progress Notes (Unsigned)
 " [facility] [address] [place] [address] Phone: [phone] Subjective:   LILLETTE Berwyn Posey, am serving as a scribe for Dr. Arthea Claudene.  I'm seeing this patient by the request  of:  Joshua Debby CROME, MD  CC: back pain f/u  YEP:Dlagzrupcz  AMIR FICK is a 68 y.o. male coming in with complaint of back and neck pain. OMT 07/17/2022. Epidural 01/23/2024. Patient states that he had some relief from epidural but not as much as last injection. Able to move around with less pain. Pain is worse when he sits and from sit to stand. Feels like he can wait for a bit before another injection.   Medications patient has been prescribed: None  Taking:         Reviewed prior external information including notes and imaging from previsou exam, outside providers and external EMR if available.   As well as notes that were available from care everywhere and other healthcare systems.  Past medical history, social, surgical and family history all reviewed in electronic medical record.  No pertanent information unless stated regarding to the chief complaint.   Past Medical History:  Diagnosis Date   Asthma    Depression    HTN (hypertension)    Hyperlipidemia    Syncope     Allergies[1]   Review of Systems:  No headache, visual changes, nausea, vomiting, diarrhea, constipation, dizziness, abdominal pain, skin rash, fevers, chills, night sweats, weight loss, swollen lymph nodes, body aches, joint swelling, chest pain, shortness of breath, mood changes. POSITIVE muscle aches  Objective  Blood pressure 122/82, pulse 86, height 5' 7.5 (1.715 m), weight 182 lb (82.6 kg), SpO2 98%.   General: No apparent distress alert and oriented x3 mood and affect normal, dressed appropriately.  HEENT: Pupils equal, extraocular movements intact  Respiratory: Patient's speak in full sentences and does not appear short of breath  Cardiovascular: No lower extremity edema,  non tender, no erythema  Gait MSK:  Back does have some loss lordosis.  Tightness noted in the parascapular area. Neck exam does have some limited sidebending bilaterally.  Osteopathic findings  C2 flexed rotated and side bent right C7 flexed rotated and side bent left T3 extended rotated and side bent right inhaled rib T7 extended rotated and side bent left L2 flexed rotated and side bent right Sacrum right on right     Assessment and Plan:  Degenerative disc disease, lumbar The patient does have some loss lordosis.  We were careful with the back with him doing relatively well but did do a good amount of manipulation on the cervical spine.  Patient has responded well to this previously.  Not taking any medicine on a regular basis.  We discussed if needed refills though we can discuss it.  Discussed icing regimen and home exercises.  Follow-up again in 6 to 12 weeks responding well to epidurals when needed.    Nonallopathic problems  Decision today to treat with OMT was based on Physical Exam  After verbal consent patient was treated with HVLA, ME, FPR techniques in cervical, rib, thoracic, lumbar, and sacral  areas  Patient tolerated the procedure well with improvement in symptoms  Patient given exercises, stretches and lifestyle modifications  See medications in patient instructions if given  Patient will follow up in 4-8 weeks     The above documentation has been reviewed and is accurate and complete Merna Baldi M Theoplis Garciagarcia, DO  Note: This dictation was prepared with Dragon dictation along with smaller phrase technology. Any transcriptional errors that result from this process are unintentional.            [1]  Allergies Allergen Reactions   Dilaudid  [Hydromorphone  Hcl] Shortness Of Breath   Phenergan  [Promethazine  Hcl] Other (See Comments)    Shaky, very out of sorts    Hydromorphone  Hcl    Other     HORSE SERUM TETANUS ANTI TOX : PATIENT GETS HIGH FEVER    Promethazine  Hcl    "

## 2024-03-12 ENCOUNTER — Ambulatory Visit: Admitting: Family Medicine

## 2024-03-12 ENCOUNTER — Encounter: Payer: Self-pay | Admitting: Family Medicine

## 2024-03-12 VITALS — BP 122/82 | HR 86 | Ht 67.5 in | Wt 182.0 lb

## 2024-03-12 DIAGNOSIS — M9903 Segmental and somatic dysfunction of lumbar region: Secondary | ICD-10-CM

## 2024-03-12 DIAGNOSIS — M9908 Segmental and somatic dysfunction of rib cage: Secondary | ICD-10-CM | POA: Diagnosis not present

## 2024-03-12 DIAGNOSIS — M9902 Segmental and somatic dysfunction of thoracic region: Secondary | ICD-10-CM

## 2024-03-12 DIAGNOSIS — M9904 Segmental and somatic dysfunction of sacral region: Secondary | ICD-10-CM

## 2024-03-12 DIAGNOSIS — M9901 Segmental and somatic dysfunction of cervical region: Secondary | ICD-10-CM

## 2024-03-12 DIAGNOSIS — M51362 Other intervertebral disc degeneration, lumbar region with discogenic back pain and lower extremity pain: Secondary | ICD-10-CM

## 2024-03-12 NOTE — Assessment & Plan Note (Signed)
 The patient does have some loss lordosis.  We were careful with the back with him doing relatively well but did do a good amount of manipulation on the cervical spine.  Patient has responded well to this previously.  Not taking any medicine on a regular basis.  We discussed if needed refills though we can discuss it.  Discussed icing regimen and home exercises.  Follow-up again in 6 to 12 weeks responding well to epidurals when needed.

## 2024-03-23 ENCOUNTER — Other Ambulatory Visit: Payer: Self-pay | Admitting: Internal Medicine

## 2024-03-23 DIAGNOSIS — F514 Sleep terrors [night terrors]: Secondary | ICD-10-CM

## 2024-06-04 ENCOUNTER — Ambulatory Visit: Admitting: Internal Medicine

## 2025-01-15 ENCOUNTER — Encounter: Admitting: Internal Medicine

## 2025-01-15 ENCOUNTER — Ambulatory Visit
